# Patient Record
Sex: Male | Born: 1950
Health system: Southern US, Community
[De-identification: ages and names within clinical notes are randomized; demographics above are authoritative.]

## PROBLEM LIST (undated history)

## (undated) DIAGNOSIS — T4145XA Adverse effect of unspecified anesthetic, initial encounter: Secondary | ICD-10-CM

## (undated) DIAGNOSIS — T8859XA Other complications of anesthesia, initial encounter: Secondary | ICD-10-CM

## (undated) DIAGNOSIS — H9190 Unspecified hearing loss, unspecified ear: Secondary | ICD-10-CM

## (undated) DIAGNOSIS — E785 Hyperlipidemia, unspecified: Secondary | ICD-10-CM

## (undated) DIAGNOSIS — Z974 Presence of external hearing-aid: Secondary | ICD-10-CM

## (undated) DIAGNOSIS — B009 Herpesviral infection, unspecified: Secondary | ICD-10-CM

## (undated) DIAGNOSIS — J302 Other seasonal allergic rhinitis: Secondary | ICD-10-CM

## (undated) DIAGNOSIS — S0300XA Dislocation of jaw, unspecified side, initial encounter: Secondary | ICD-10-CM

## (undated) DIAGNOSIS — G43709 Chronic migraine without aura, not intractable, without status migrainosus: Secondary | ICD-10-CM

## (undated) DIAGNOSIS — G4700 Insomnia, unspecified: Secondary | ICD-10-CM

## (undated) DIAGNOSIS — G4730 Sleep apnea, unspecified: Secondary | ICD-10-CM

## (undated) DIAGNOSIS — T7840XA Allergy, unspecified, initial encounter: Secondary | ICD-10-CM

## (undated) DIAGNOSIS — E119 Type 2 diabetes mellitus without complications: Secondary | ICD-10-CM

## (undated) HISTORY — DX: Allergy, unspecified, initial encounter: T78.40XA

## (undated) HISTORY — DX: Chronic migraine without aura, not intractable, without status migrainosus: G43.709

## (undated) HISTORY — PX: TONSILLECTOMY AND ADENOIDECTOMY: SUR1326

## (undated) HISTORY — DX: Insomnia, unspecified: G47.00

## (undated) HISTORY — PX: MIDDLE EAR SURGERY: SHX713

## (undated) HISTORY — DX: Sleep apnea, unspecified: G47.30

## (undated) HISTORY — PX: KIDNEY STONE SURGERY: SHX686

---

## 2006-02-19 ENCOUNTER — Emergency Department: Payer: Self-pay | Admitting: Unknown Physician Specialty

## 2007-01-16 ENCOUNTER — Ambulatory Visit: Payer: Self-pay | Admitting: Family Medicine

## 2010-02-21 ENCOUNTER — Emergency Department: Payer: Self-pay | Admitting: Emergency Medicine

## 2010-02-25 ENCOUNTER — Ambulatory Visit: Payer: Self-pay

## 2012-07-09 DIAGNOSIS — E221 Hyperprolactinemia: Secondary | ICD-10-CM | POA: Insufficient documentation

## 2012-11-04 ENCOUNTER — Observation Stay: Payer: Self-pay | Admitting: Specialist

## 2012-11-04 LAB — TROPONIN I
Troponin-I: <0.02 ng/mL
Troponin-I: <0.02 ng/mL

## 2012-11-04 LAB — COMPREHENSIVE METABOLIC PANEL
BUN: 9 mg/dL (ref 7–18)
Bilirubin,Total: 0.3 mg/dL (ref 0.2–1.0)
Chloride: 105 mmol/L (ref 98–107)
EGFR (Non-African Amer.): >60
Glucose: 192 mg/dL — ABNORMAL HIGH (ref 65–99)
Osmolality: 285 (ref 275–301)
SGOT(AST): 13 U/L — ABNORMAL LOW (ref 15–37)
Sodium: 141 mmol/L (ref 136–145)
Total Protein: 7.2 g/dL (ref 6.4–8.2)

## 2012-11-04 LAB — CBC
HCT: 42.3 % (ref 40.0–52.0)
HGB: 14.9 g/dL (ref 13.0–18.0)
MCH: 32.6 pg (ref 26.0–34.0)
MCV: 93 fL (ref 80–100)
RBC: 4.57 10*6/uL (ref 4.40–5.90)
WBC: 7.1 10*3/uL (ref 3.8–10.6)

## 2012-11-04 LAB — TSH: Thyroid Stimulating Horm: 1.53 u[IU]/mL

## 2012-11-04 LAB — CK TOTAL AND CKMB (NOT AT ARMC): CK, Total: 107 U/L (ref 35–232)

## 2012-11-05 DIAGNOSIS — I209 Angina pectoris, unspecified: Secondary | ICD-10-CM

## 2012-11-05 LAB — BASIC METABOLIC PANEL
Calcium, Total: 8.6 mg/dL (ref 8.5–10.1)
Chloride: 109 mmol/L — ABNORMAL HIGH (ref 98–107)
Co2: 27 mmol/L (ref 21–32)
Creatinine: 1.08 mg/dL (ref 0.60–1.30)
EGFR (African American): >60
EGFR (Non-African Amer.): >60
Glucose: 117 mg/dL — ABNORMAL HIGH (ref 65–99)
Potassium: 3.7 mmol/L (ref 3.5–5.1)
Sodium: 142 mmol/L (ref 136–145)

## 2012-11-05 LAB — CBC WITH DIFFERENTIAL/PLATELET
Basophil #: 0.1 10*3/uL (ref 0.0–0.1)
Basophil %: 0.5 %
Eosinophil %: 2.8 %
HGB: 13.5 g/dL (ref 13.0–18.0)
MCH: 32.8 pg (ref 26.0–34.0)
MCV: 92 fL (ref 80–100)
Monocyte #: 0.7 x10 3/mm (ref 0.2–1.0)
Neutrophil %: 72.7 %
Platelet: 149 10*3/uL — ABNORMAL LOW (ref 150–440)
RBC: 4.12 10*6/uL — ABNORMAL LOW (ref 4.40–5.90)
RDW: 13.2 % (ref 11.5–14.5)

## 2012-11-05 LAB — CK TOTAL AND CKMB (NOT AT ARMC)
CK, Total: 94 U/L (ref 35–232)
CK-MB: 1.2 ng/mL (ref 0.5–3.6)
CK-MB: 0.5 ng/mL (ref 0.5–3.6)

## 2012-11-05 LAB — TROPONIN I: Troponin-I: <0.02 ng/mL

## 2013-08-07 DIAGNOSIS — Z86010 Personal history of colonic polyps: Secondary | ICD-10-CM | POA: Insufficient documentation

## 2013-08-07 LAB — HM COLONOSCOPY

## 2014-05-27 ENCOUNTER — Emergency Department: Payer: Self-pay | Admitting: Emergency Medicine

## 2014-05-27 LAB — COMPREHENSIVE METABOLIC PANEL
ALBUMIN: 4.2 g/dL (ref 3.4–5.0)
ANION GAP: 8 (ref 7–16)
AST: 15 U/L (ref 15–37)
Alkaline Phosphatase: 76 U/L
BUN: 18 mg/dL (ref 7–18)
Bilirubin,Total: 0.5 mg/dL (ref 0.2–1.0)
CALCIUM: 8.9 mg/dL (ref 8.5–10.1)
CREATININE: 1.4 mg/dL — AB (ref 0.60–1.30)
Chloride: 105 mmol/L (ref 98–107)
Co2: 26 mmol/L (ref 21–32)
EGFR (Non-African Amer.): 54 — ABNORMAL LOW
GLUCOSE: 188 mg/dL — AB (ref 65–99)
OSMOLALITY: 284 (ref 275–301)
Potassium: 3.6 mmol/L (ref 3.5–5.1)
SGPT (ALT): 35 U/L
Sodium: 139 mmol/L (ref 136–145)
Total Protein: 6.8 g/dL (ref 6.4–8.2)

## 2014-05-27 LAB — URINALYSIS, COMPLETE
Bacteria: NONE SEEN
Bilirubin,UR: NEGATIVE
Glucose,UR: 150 mg/dL (ref 0–75)
Leukocyte Esterase: NEGATIVE
Nitrite: NEGATIVE
PH: 5 (ref 4.5–8.0)
Specific Gravity: 1.028 (ref 1.003–1.030)
Squamous Epithelial: <1
WBC UR: 2 /HPF (ref 0–5)

## 2014-05-27 LAB — CBC
HCT: 44.5 % (ref 40.0–52.0)
HGB: 14.8 g/dL (ref 13.0–18.0)
MCH: 31.5 pg (ref 26.0–34.0)
MCHC: 33.3 g/dL (ref 32.0–36.0)
MCV: 95 fL (ref 80–100)
Platelet: 193 10*3/uL (ref 150–440)
RBC: 4.7 10*6/uL (ref 4.40–5.90)
RDW: 12.7 % (ref 11.5–14.5)
WBC: 13.2 10*3/uL — ABNORMAL HIGH (ref 3.8–10.6)

## 2014-05-27 LAB — LIPASE, BLOOD: Lipase: 136 U/L (ref 73–393)

## 2014-09-04 NOTE — Discharge Summary (Signed)
PATIENT NAME:  Billy Hardin, Billy Hardin MR#:  [id number] DATE OF BIRTH:  1950-11-06  DATE OF ADMISSION:  11/04/2012 DATE OF DISCHARGE:  11/05/2012  For a detailed note, please see the history and physical done on admission by Dr. Tressia Miners.   DIAGNOSES AT DISCHARGE: 1.  Chest pain, likely related to anxiety and stress.  2.  Testosterone deficiency.  3.  Early glucose intolerance.   DIET: The patient is being discharged on a regular diet.   ACTIVITY: As tolerated.   FOLLOW-UP: With Dr. Lelon Huh in the next 1 to 2 weeks.    DISCHARGE MEDICATIONS: Aspirin 81 mg daily,  cinnamon tablets b.i.d., vitamin E 400 international units b.i.d., testosterone transdermally daily.   PERTINENT STUDIES DONE DURING THE HOSPITAL COURSE: As follows: A chest x-ray done on admission showing no acute cardiopulmonary disease. A nuclear medicine stress test done on June 24, showing exercise myocardial perfusion study with no significant ischemia, no wall motion abnormalities, ejection fraction of 68%. No EKG changes concerning for ischemia.   HOSPITAL COURSE: This is a 64 year old male with medical problems as mentioned above, presented to the hospital with chest pain.  1.  Chest pain. The patient has had some typical and atypical symptoms for angina; therefore, was observed overnight on telemetry, had three sets of cardiac markers checked, which were negative. He also underwent exercise treadmill nuclear stress test, which was essentially normal. The most likely cause of his chest pain was probably anxiety-related as he has been under a lot of stress. He was told to follow up with his primary care physician for further assessment or need for medications for his anxiety. Since the patient is currently chest pain-free and hemodynamically stable. He is being discharged home.  2.  Testosterone deficiency. The patient will resume his testosterone supplements.  3.  Hyperglycemia. The patient random blood sugar on admission was  192. His hemoglobin A1c was 6.8. His sugars have remained above 100,  like 117 and 130s. He likely has early glucose intolerance. Does not need to be treated, but this further needs to be followed by his primary care physician, Dr. Lelon Huh.   TIME SPENT DISCHARGE: 35 minutes   ____________________________ Belia Heman. Verdell Carmine, MD vjs:cc D: 11/05/2012 15:34:23 ET T: 11/05/2012 16:21:30 ET JOB#: [id number]  cc: Belia Heman. Verdell Carmine, MD, <Dictator> Kirstie Peri. Caryn Section, MD Henreitta Leber MD ELECTRONICALLY SIGNED 11/15/2012 15:04

## 2014-09-04 NOTE — H&P (Signed)
PATIENT NAME:  Billy Hardin, Billy Hardin MR#:  [id number] DATE OF BIRTH:  [birth year]  DATE OF ADMISSION:  11/04/2012  ADMITTING PHYSICIAN: Gladstone Lighter, MD   PRIMARY CARE PHYSICIAN: Lelon Huh, MD  CHIEF COMPLAINT: Chest pain.   HISTORY OF PRESENT ILLNESS: Mr. Millon is a 64 year old Caucasian male with past medical history significant for diet-controlled diabetes mellitus and hard of hearing in the right ear secondary to childhood disease, resulting in surgery on his ear. He comes from home secondary to chest pain that started yesterday. The patient was on vacation for the last two days and did not sleep well during the vacation. They returned from the vacation yesterday. He is complaining of some chest pain. It initially felt more like an indigestion pain, nonradiating, associated with some nocturia. No dyspnea or diaphoresis.     FAMILY HISTORY: Family history of heart disease. Father had heart disease, but in his late 77s and 30s. The patient works as a Physiological scientist, pretty active at baseline and has not noticed any chest pain on exertion in the past. His first set of troponins is negative. He is being admitted under observation to rule out myocardial infarction.   PAST MEDICAL HISTORY: 1.  Diabetes mellitus.  2.  Hard of hearing, right ear worse than the left ear.   PAST SURGICAL HISTORY: Surgery on his right ear right bones when he was a child.   ALLERGIES: PENICILLIN.   CURRENT HOME MEDICATIONS:  1.   Aspirin 81 mg p.o. daily.  2.  Cialis as needed.  3.  Vitamins p.o. daily.   SOCIAL HISTORY: Lives at home with his wife. No smoking or alcohol use. Works as a Physiological scientist.   FAMILY HISTORY: Dad with heart disease and mom with diabetes.   REVIEW OF SYSTEMS:  CONSTITUTIONAL: No fever, fatigue or weakness.  EYES: No blurred vision, double vision, glaucoma or cataracts. Uses reading glasses.  ENT: Positive for hearing loss worse on the right side and on a better on the left  side, as he has hearing aids there.  No tinnitus, ear pain, epistaxis or discharge.  RESPIRATORY: No cough, wheeze, hemoptysis or chronic obstructive pulmonary disease.  CARDIOVASCULAR: Positive for chest pain. No orthopnea, edema, arrhythmia, palpitations or syncope.  GASTROINTESTINAL: No nausea, vomiting, diarrhea, abdominal pain, hematemesis or melena.  GENITOURINARY: No dysuria, hematuria, renal calculus, frequency or incontinence.  ENDOCRINE: No polyuria, nocturia, thyroid problems, heat or cold intolerance.  HEMATOLOGY: No anemia, easy bruising or bleeding.  SKIN: No acne, rash or lesions.  MUSCULOSKELETAL: No neck, back, shoulder pain, arthritis or gout.  NEUROLOGIC: No numbness, weakness, CVA, transient ischemic attack or seizures.  PSYCHOLOGICAL: No anxiety, insomnia, depression.   PHYSICAL EXAMINATION: VITAL SIGNS: Temperature 98.4 degree Fahrenheit,  pulse 88, respirations 24, blood pressure 155/74, pulse oximetry 100% on room air.  GENERAL: Well-built, well-nourished male lying in bed, not in any acute distress.  HEENT: Normocephalic, atraumatic. Pupils equal, round, reacting to light. Anicteric sclerae. Extraocular movements intact. Oropharynx clear without erythema, mass or exudates.  NECK: Supple. No thyromegaly, JVD or carotid bruits. No lymphadenopathy. Normal full range of motion without pain present.  LUNGS: Clear to auscultation bilaterally. No wheeze or crackles. No use of accessory muscles for breathing.  CARDIOVASCULAR: S1, S2 regular rate and rhythm. No murmurs, rubs or gallops.  ABDOMEN: Soft, nontender, nondistended. No hepatosplenomegaly. Normal bowel sounds.  EXTREMITIES: No pedal edema. No clubbing or cyanosis, 2+ dorsalis pedis pulses palpable bilaterally.  MUSCULOSKELETAL: Joints without any tenderness or effusion.  SKIN: No acne, rash or lesions.  LYMPHATICS: No cervical or inguinal lymphadenopathy.  NEUROLOGIC: Cranial nerves, except for eighth cranial  nerve, secondary to hearing loss seems to be intact. Deep tendon reflexes are 2+ symmetric bilateral lower and upper extremities. Motor strength is 5/5 up at all four extremities. Sensation is intact.  PSYCHOLOGICAL: The patient is awake, alert, oriented x 3.   LABORATORY, DIAGNOSTIC AND RADIOLOGI DATA: WBC 7.1, hemoglobin 14.9, hematocrit 42.3, platelet count 174.   Sodium 141, potassium 3.3, chloride 105, bicarbonate 26, BUN 9, creatinine 1.06 glucose 192, and calcium 9.1.   ALT 25, AST 13, alkaline phosphatase 67, total bilirubin 0.3, albumin of 4.1, lipase 111. Troponin less than 0.02.   EKG normal sinus rhythm, heart rate of 76, no acute ST-T wave abnormalities.   ASSESSMENT AND PLAN: A 64 year old male with no significant past medical history other than diet-controlled diabetes mellitus, brought in for chest pain.  1.  Chest pain. Could be more atypical angina or reflux pain, as it started as indigestion, but because of his risk factors, which include diabetes and family history, he is being admitted under observation to telemetry and we will get a Myoview in the morning. Recycle cardiac enzymes, continue aspirin and nitroglycerin and check lipid profile.  2.  Diabetes mellitus. Taken off of medications and diet control lately. Check hemoglobin A1c and placed on sliding scale.  3.  Gastrointestinal and deep vein thrombosis prophylaxis. On Protonix and Lovenox.  4.  Hypokalemia.  The patient's potassium is being repleted.    CODE STATUS: Full code.   TIME SPENT ON ADMISSION: 50 minutes summary.  ____________________________ Gladstone Lighter, MD rk:cc D: 11/04/2012 21:55:15 ET T: 11/04/2012 22:10:35 ET JOB#: [id number]  cc: Gladstone Lighter, MD, <Dictator> Gladstone Lighter MD ELECTRONICALLY SIGNED 11/05/2012 15:09

## 2014-09-15 DIAGNOSIS — H905 Unspecified sensorineural hearing loss: Secondary | ICD-10-CM | POA: Insufficient documentation

## 2014-09-15 DIAGNOSIS — H903 Sensorineural hearing loss, bilateral: Secondary | ICD-10-CM | POA: Insufficient documentation

## 2014-10-16 ENCOUNTER — Other Ambulatory Visit: Payer: Self-pay | Admitting: Family Medicine

## 2014-10-16 DIAGNOSIS — E119 Type 2 diabetes mellitus without complications: Secondary | ICD-10-CM

## 2014-10-16 MED ORDER — METFORMIN HCL 500 MG PO TABS: 500.0000 mg | ORAL_TABLET | Freq: Two times a day (BID) | ORAL | Status: DC

## 2014-10-21 ENCOUNTER — Encounter
Admission: RE | Admit: 2014-10-21 | Discharge: 2014-10-21 | Disposition: A | Discharge status: Home / Self Care | Payer: BLUE CROSS/BLUE SHIELD | Source: Ambulatory Visit | Attending: Urology | Admitting: Urology

## 2014-10-21 DIAGNOSIS — Z01812 Encounter for preprocedural laboratory examination: Secondary | ICD-10-CM | POA: Insufficient documentation

## 2014-10-21 DIAGNOSIS — Z01810 Encounter for preprocedural cardiovascular examination: Secondary | ICD-10-CM | POA: Diagnosis not present

## 2014-10-21 HISTORY — DX: Adverse effect of unspecified anesthetic, initial encounter: T41.45XA

## 2014-10-21 HISTORY — DX: Type 2 diabetes mellitus without complications: E11.9

## 2014-10-21 HISTORY — DX: Other seasonal allergic rhinitis: J30.2

## 2014-10-21 HISTORY — DX: Presence of external hearing-aid: Z97.4

## 2014-10-21 HISTORY — DX: Other complications of anesthesia, initial encounter: T88.59XA

## 2014-10-21 HISTORY — DX: Dislocation of jaw, unspecified side, initial encounter: S03.00XA

## 2014-10-21 HISTORY — DX: Unspecified hearing loss, unspecified ear: H91.90

## 2014-10-21 HISTORY — DX: Herpesviral infection, unspecified: B00.9

## 2014-10-21 HISTORY — DX: Hyperlipidemia, unspecified: E78.5

## 2014-10-21 LAB — BASIC METABOLIC PANEL
ANION GAP: 8 (ref 5–15)
BUN: 18 mg/dL (ref 6–20)
CALCIUM: 9.5 mg/dL (ref 8.9–10.3)
CO2: 26 mmol/L (ref 22–32)
Chloride: 106 mmol/L (ref 101–111)
Creatinine, Ser: 0.89 mg/dL (ref 0.61–1.24)
GFR calc Af Amer: >60 mL/min (ref >60)
GFR calc non Af Amer: >60 mL/min (ref >60)
Glucose, Bld: 169 mg/dL — ABNORMAL HIGH (ref 65–99)
Potassium: 3.6 mmol/L (ref 3.5–5.1)
Sodium: 140 mmol/L (ref 135–145)

## 2014-10-21 NOTE — Patient Instructions (Signed)
  Your procedure is scheduled on: 11/03/14  Tues. Report to Day Surgery. To find out your arrival time please call [phone] between 1PM - 3PM on 11/02/14 Monday Remember: Instructions that are not followed completely may result in serious medical risk, up to and including death, or upon the discretion of your surgeon and anesthesiologist your surgery may need to be rescheduled.    __x__ 1. Do not eat food or drink liquids after midnight. No gum chewing or hard candies.     ____ 2. No Alcohol for 24 hours before or after surgery.   ____ 3. Bring all medications with you on the day of surgery if instructed.    __x__ 4. Notify your doctor if there is any change in your medical condition     (cold, fever, infections).     Do not wear jewelry, make-up, hairpins, clips or nail polish.  Do not wear lotions, powders, or perfumes. You may wear deodorant.  Do not shave 48 hours prior to surgery. Men may shave face and neck.  Do not bring valuables to the hospital.    [facility] is not responsible for any belongings or valuables.               Contacts, dentures or bridgework may not be worn into surgery.  Leave your suitcase in the car. After surgery it may be brought to your room.  For patients admitted to the hospital, discharge time is determined by your                treatment team.   Patients discharged the day of surgery will not be allowed to drive home.   Please read over the following fact sheets that you were given:      __x__ Take these medicines the morning of surgery with A SIP OF WATER:    1. LIpitor  2.   3.   4.  5.  6.  ____ Fleet Enema (as directed)   _x___ Use CHG Soap as directed  ____ Use inhalers on the day of surgery  __x__ Stop metformin 2 days prior to surgery    ____ Take 1/2 of usual insulin dose the night before surgery and none on the morning of surgery.   ____ Stop Coumadin/Plavix/aspirin on .  ____ Stop Anti-inflammatories on    __x__  Stop supplements until after surgery.  Stop vitamin E and fish oils 7 days before surgery  ____ Bring C-Pap to the hospital.

## 2014-10-22 NOTE — H&P (Signed)
NAME:  Billy Hardin, KNOPE NO.:  [id number]  MEDICAL RECORD NO.:  [id number]  LOCATION:  PERIO                        FACILITY:  [facility]  PHYSICIAN:  Maryan Puls          DATE OF BIRTH:  [birth year]  DATE OF ADMISSION:  11/03/2014 DATE OF DISCHARGE:                            HISTORY AND PHYSICAL   Same-day surgery scheduled November 03, 2014.  CHIEF COMPLAINT: Erectile dysfunction.  HISTORY OF PRESENT ILLNESS: Mr. Billy Hardin is a 64 year old Caucasian male with a greater than 4-year history of erectile dysfunction.  He is not having adequate results with Cialis, Viagra or Levitra, and comes in now for penile prosthesis placement.  The patient also has hypogonadism and is currently on testosterone cream.  ALLERGIES: ALLERGIC TO PENICILLIN.  CURRENT MEDICATIONS: 1. Allergy Relief. 2. L-arginine. 3. Atorvastatin. 4. Valacyclovir. 5. Fish oil. 6. Fluticasone. 7. Tramadol. 8. Cialis. 9. Metformin. 10.Aspirin.  PREVIOUS SURGICAL PROCEDURES:  Include repair of a right ear injury in 1965, which has resulted in hearing loss on that side.  SOCIAL HISTORY:  The patient denied tobacco use.  Consumes 1 to 4 alcoholic beverages per week.  FAMILY HISTORY:  Remarkable for parents with diabetes.  PAST AND CURRENT MEDICAL CONDITIONS: 1. Type 1 diabetes. 2. Herpes simplex. 3. Plantar fasciitis. 4. Hypogonadism.  REVIEW OF SYSTEMS: The patient occasionally has hot flashes and night sweats.  He has complete hearing loss on the right and partial hearing loss on the left requiring use of a hearing aid.  He has chronic insomnia.  He denied chest pain, shortness of breath, stroke or hypertension.  PHYSICAL EXAMINATION:  GENERAL:  Well nourished white male in no acute distress. HEENT:  Sclerae were clear.  Pupils were equally round, reactive to light and accommodation.  Extraocular movements were intact. NECK:  Supple.  No palpable cervical lymphadenopathy.  No  audible carotid bruits. LUNGS:  Clear to auscultation. CARDIOVASCULAR:  Regular rate and rhythm without audible murmurs. ABDOMEN:  Soft, nontender abdomen. GU:  Circumcised. Testes atrophic, 10 cc in size each. RECTAL:  20 gram, smooth, non-tender prostate. NEUROMUSCULAR:  Alert and oriented x3.  IMPRESSION: 1. Erectile dysfunction. 2. Hypogonadism.  PLAN:  Inflatable penile prosthesis placement.          ______________________________ Maryan Puls     MW/MEDQ  D:  10/21/2014  T:  10/21/2014  Job:  [id number]

## 2014-11-03 ENCOUNTER — Observation Stay
Admission: RE | Admit: 2014-11-03 | Discharge: 2014-11-04 | Disposition: A | Discharge status: Home / Self Care | Payer: BLUE CROSS/BLUE SHIELD | Source: Ambulatory Visit | Attending: Urology | Admitting: Urology

## 2014-11-03 ENCOUNTER — Encounter
Admission: RE | Disposition: A | Discharge status: Home / Self Care | Payer: Self-pay | Source: Ambulatory Visit | Attending: Urology

## 2014-11-03 ENCOUNTER — Ambulatory Visit: Payer: BLUE CROSS/BLUE SHIELD | Admitting: Anesthesiology

## 2014-11-03 DIAGNOSIS — Z880 Allergy status to penicillin: Secondary | ICD-10-CM | POA: Insufficient documentation

## 2014-11-03 DIAGNOSIS — E291 Testicular hypofunction: Secondary | ICD-10-CM | POA: Insufficient documentation

## 2014-11-03 DIAGNOSIS — E119 Type 2 diabetes mellitus without complications: Secondary | ICD-10-CM | POA: Diagnosis present

## 2014-11-03 DIAGNOSIS — E109 Type 1 diabetes mellitus without complications: Secondary | ICD-10-CM | POA: Insufficient documentation

## 2014-11-03 DIAGNOSIS — Z79899 Other long term (current) drug therapy: Secondary | ICD-10-CM | POA: Insufficient documentation

## 2014-11-03 DIAGNOSIS — B009 Herpesviral infection, unspecified: Secondary | ICD-10-CM | POA: Diagnosis not present

## 2014-11-03 DIAGNOSIS — Z9889 Other specified postprocedural states: Secondary | ICD-10-CM | POA: Insufficient documentation

## 2014-11-03 DIAGNOSIS — N529 Male erectile dysfunction, unspecified: Secondary | ICD-10-CM | POA: Diagnosis present

## 2014-11-03 DIAGNOSIS — H9191 Unspecified hearing loss, right ear: Secondary | ICD-10-CM | POA: Insufficient documentation

## 2014-11-03 DIAGNOSIS — Z7982 Long term (current) use of aspirin: Secondary | ICD-10-CM | POA: Insufficient documentation

## 2014-11-03 HISTORY — PX: PENILE PROSTHESIS IMPLANT: SHX240

## 2014-11-03 LAB — GLUCOSE, CAPILLARY
GLUCOSE-CAPILLARY: 158 mg/dL — AB (ref 65–99)
GLUCOSE-CAPILLARY: 148 mg/dL — AB (ref 65–99)
Glucose-Capillary: 112 mg/dL — ABNORMAL HIGH (ref 65–99)

## 2014-11-03 SURGERY — INSERTION, PENILE PROSTHESIS, INFLATABLE
Anesthesia: General | Wound class: Clean Contaminated

## 2014-11-03 MED ORDER — MENTHOL 3 MG MT LOZG
1.0000 | LOZENGE | OROMUCOSAL | Status: DC | PRN
  Filled 2014-11-03: qty 9

## 2014-11-03 MED ORDER — VANCOMYCIN HCL 500 MG IV SOLR
500.0000 mg | Freq: Once | INTRAVENOUS | Status: AC
  Administered 2014-11-03: 500 mg via INTRAVENOUS
  Filled 2014-11-03 (×2): qty 500

## 2014-11-03 MED ORDER — FAMOTIDINE 20 MG PO TABS: 20.0000 mg | ORAL_TABLET | Freq: Once | ORAL | Status: DC

## 2014-11-03 MED ORDER — NEOMYCIN-POLYMYXIN B GU 40-200000 IR SOLN
Status: AC
  Filled 2014-11-03: qty 4

## 2014-11-03 MED ORDER — HYDROMORPHONE HCL 1 MG/ML IJ SOLN
INTRAMUSCULAR | Status: AC
  Filled 2014-11-03: qty 1

## 2014-11-03 MED ORDER — BISACODYL 10 MG RE SUPP: 10.0000 mg | Freq: Every day | RECTAL | Status: DC | PRN

## 2014-11-03 MED ORDER — FENTANYL CITRATE (PF) 100 MCG/2ML IJ SOLN
INTRAMUSCULAR | Status: AC
  Filled 2014-11-03: qty 2

## 2014-11-03 MED ORDER — ONDANSETRON HCL 4 MG/2ML IJ SOLN
INTRAMUSCULAR | Status: DC | PRN
Start: 2014-11-03 — End: 2014-11-03
  Administered 2014-11-03: 4 mg via INTRAVENOUS

## 2014-11-03 MED ORDER — ONDANSETRON HCL 4 MG/2ML IJ SOLN
4.0000 mg | INTRAMUSCULAR | Status: DC | PRN
Start: 2014-11-03 — End: 2014-11-04

## 2014-11-03 MED ORDER — ZOLPIDEM TARTRATE 5 MG PO TABS: 5.0000 mg | ORAL_TABLET | Freq: Every evening | ORAL | Status: DC | PRN

## 2014-11-03 MED ORDER — SODIUM CHLORIDE 0.9 % IJ SOLN: 3.0000 mL | INTRAMUSCULAR | Status: DC | PRN

## 2014-11-03 MED ORDER — FENTANYL CITRATE (PF) 100 MCG/2ML IJ SOLN
INTRAMUSCULAR | Status: DC | PRN
  Administered 2014-11-03 (×4): 25 ug via INTRAVENOUS

## 2014-11-03 MED ORDER — LEVOFLOXACIN IN D5W 500 MG/100ML IV SOLN
INTRAVENOUS | Status: AC
  Administered 2014-11-03: 500 mg via INTRAVENOUS
  Filled 2014-11-03: qty 100

## 2014-11-03 MED ORDER — LEVOFLOXACIN IN D5W 500 MG/100ML IV SOLN
500.0000 mg | INTRAVENOUS | Status: DC
Start: 2014-11-03 — End: 2014-11-03

## 2014-11-03 MED ORDER — VANCOMYCIN HCL 500 MG IV SOLR: 500.0000 mg | Freq: Two times a day (BID) | INTRAVENOUS | Status: DC

## 2014-11-03 MED ORDER — FENTANYL CITRATE (PF) 100 MCG/2ML IJ SOLN
25.0000 ug | INTRAMUSCULAR | Status: DC | PRN
  Administered 2014-11-03 (×4): 25 ug via INTRAVENOUS

## 2014-11-03 MED ORDER — MIDAZOLAM HCL 2 MG/2ML IJ SOLN
INTRAMUSCULAR | Status: DC | PRN
  Administered 2014-11-03: 2 mg via INTRAVENOUS

## 2014-11-03 MED ORDER — LIDOCAINE HCL (CARDIAC) 20 MG/ML IV SOLN
INTRAVENOUS | Status: DC | PRN
  Administered 2014-11-03: 100 mg via INTRAVENOUS

## 2014-11-03 MED ORDER — SODIUM CHLORIDE 0.9 % IJ SOLN
3.0000 mL | Freq: Two times a day (BID) | INTRAMUSCULAR | Status: DC
  Administered 2014-11-04: 3 mL via INTRAVENOUS

## 2014-11-03 MED ORDER — HYDROCODONE-ACETAMINOPHEN 5-325 MG PO TABS
1.0000 | ORAL_TABLET | ORAL | Status: DC | PRN
  Administered 2014-11-03: 1 via ORAL
  Administered 2014-11-03 – 2014-11-04 (×3): 2 via ORAL
  Filled 2014-11-03 (×3): qty 2
  Filled 2014-11-03: qty 1

## 2014-11-03 MED ORDER — DEXAMETHASONE SODIUM PHOSPHATE 4 MG/ML IJ SOLN
INTRAMUSCULAR | Status: DC | PRN
  Administered 2014-11-03: 5 mg via INTRAVENOUS

## 2014-11-03 MED ORDER — GENTAMICIN IN SALINE 1.6-0.9 MG/ML-% IV SOLN
80.0000 mg | Freq: Once | INTRAVENOUS | Status: AC
  Administered 2014-11-03: 80 mg via INTRAVENOUS
  Filled 2014-11-03: qty 50

## 2014-11-03 MED ORDER — NEOMYCIN-POLYMYXIN B GU 40-200000 IR SOLN
Status: DC | PRN
  Administered 2014-11-03: 4 mL

## 2014-11-03 MED ORDER — SODIUM CHLORIDE 0.9 % IV SOLN: 250.0000 mL | INTRAVENOUS | Status: DC | PRN

## 2014-11-03 MED ORDER — HYDROMORPHONE HCL 1 MG/ML IJ SOLN
0.5000 mg | INTRAMUSCULAR | Status: DC | PRN
  Administered 2014-11-03 – 2014-11-04 (×3): 1 mg via INTRAVENOUS
  Filled 2014-11-03 (×3): qty 1

## 2014-11-03 MED ORDER — ACETAMINOPHEN 325 MG PO TABS: 650.0000 mg | ORAL_TABLET | ORAL | Status: DC | PRN

## 2014-11-03 MED ORDER — GENTAMICIN IN SALINE 1.6-0.9 MG/ML-% IV SOLN: 80.0000 mg | Freq: Three times a day (TID) | INTRAVENOUS | Status: DC

## 2014-11-03 MED ORDER — INSULIN ASPART 100 UNIT/ML ~~LOC~~ SOLN
0.0000 [IU] | SUBCUTANEOUS | Status: DC
  Administered 2014-11-03 – 2014-11-04 (×4): 2 [IU] via SUBCUTANEOUS
  Filled 2014-11-03 (×4): qty 2

## 2014-11-03 MED ORDER — ONDANSETRON HCL 4 MG/2ML IJ SOLN: 4.0000 mg | Freq: Once | INTRAMUSCULAR | Status: DC | PRN

## 2014-11-03 MED ORDER — BELLADONNA ALKALOIDS-OPIUM 16.2-60 MG RE SUPP
RECTAL | Status: AC
  Filled 2014-11-03: qty 1

## 2014-11-03 MED ORDER — PROPOFOL 10 MG/ML IV BOLUS
INTRAVENOUS | Status: DC | PRN
  Administered 2014-11-03: 160 mg via INTRAVENOUS

## 2014-11-03 MED ORDER — HYDROMORPHONE HCL 1 MG/ML IJ SOLN
0.5000 mg | INTRAMUSCULAR | Status: DC | PRN
Start: 2014-11-03 — End: 2014-11-03
  Administered 2014-11-03 (×2): 0.5 mg via INTRAVENOUS

## 2014-11-03 MED ORDER — MINERAL OIL LIGHT 100 % EX OIL
TOPICAL_OIL | CUTANEOUS | Status: AC
  Filled 2014-11-03: qty 25

## 2014-11-03 MED ORDER — PHENOL 1.4 % MT LIQD: 1.0000 | OROMUCOSAL | Status: DC | PRN

## 2014-11-03 MED ORDER — SODIUM CHLORIDE 0.9 % IV SOLN
INTRAVENOUS | Status: DC
  Administered 2014-11-03: 13:00:00 via INTRAVENOUS

## 2014-11-03 MED ORDER — BELLADONNA ALKALOIDS-OPIUM 16.2-60 MG RE SUPP
1.0000 | Freq: Four times a day (QID) | RECTAL | Status: DC | PRN
  Administered 2014-11-03: 1 via RECTAL

## 2014-11-03 SURGICAL SUPPLY — 54 items
AMS Quick connect sutureless window connectors ×3 IMPLANT
BAG URO DRAIN 2000ML W/SPOUT (MISCELLANEOUS) ×3 IMPLANT
BLADE CLIPPER SURG (BLADE) ×3 IMPLANT
BLADE SURG 15 STRL LF DISP TIS (BLADE) ×1 IMPLANT
BLADE SURG 15 STRL SS (BLADE) ×2
CANISTER SUCT 1200ML W/VALVE (MISCELLANEOUS) ×3 IMPLANT
CATH FOL 2WAY LX 18X5 (CATHETERS) ×3 IMPLANT
CLOSURE WOUND 1/2 X4 (GAUZE/BANDAGES/DRESSINGS) ×1
CONTRO-BULB SYRINGE ×3 IMPLANT
COVER MAYO STAND STRL (DRAPES) ×3 IMPLANT
DRAPE PED LAPAROTOMY (DRAPES) ×3 IMPLANT
DRESSING TELFA 4X3 1S ST N-ADH (GAUZE/BANDAGES/DRESSINGS) ×3 IMPLANT
DRSG TEGADERM 4X4.75 (GAUZE/BANDAGES/DRESSINGS) ×3 IMPLANT
DRSG TELFA 3X8 NADH (GAUZE/BANDAGES/DRESSINGS) ×3 IMPLANT
ELECT BLADE 6 FLAT ULTRCLN (ELECTRODE) ×3 IMPLANT
GAUZE FLUFF 18X24 1PLY STRL (GAUZE/BANDAGES/DRESSINGS) ×3 IMPLANT
GAUZE SPONGE 4X4 12PLY STRL (GAUZE/BANDAGES/DRESSINGS) ×3 IMPLANT
GLOVE BIO SURGEON STRL SZ7 (GLOVE) ×3 IMPLANT
GLOVE BIO SURGEON STRL SZ7.5 (GLOVE) ×6 IMPLANT
GOWN STRL REUS W/ TWL LRG LVL3 (GOWN DISPOSABLE) ×2 IMPLANT
GOWN STRL REUS W/TWL LRG LVL3 (GOWN DISPOSABLE) ×4
JELLY LUB 2OZ STRL (MISCELLANEOUS) ×2
JELLY LUBE 2OZ STRL (MISCELLANEOUS) ×1 IMPLANT
KIT ACCESSORY AMS 700 PUMP (UROLOGICAL SUPPLIES) ×3 IMPLANT
KIT RM TURNOVER STRD PROC AR (KITS) ×3 IMPLANT
LABEL OR SOLS (LABEL) ×3 IMPLANT
NS IRRIG 1000ML POUR BTL (IV SOLUTION) ×3 IMPLANT
PACK BASIN MAJOR ARMC (MISCELLANEOUS) ×3 IMPLANT
PAD ABD DERMACEA PRESS 5X9 (GAUZE/BANDAGES/DRESSINGS) ×3 IMPLANT
PAD GROUND ADULT SPLIT (MISCELLANEOUS) ×3 IMPLANT
PLUG CATH AND CAP STER (CATHETERS) ×3 IMPLANT
PREP PVP WINGED SPONGE (MISCELLANEOUS) ×3 IMPLANT
PUMP PRECONNECT MS 18 LGX (Miscellaneous) ×1 IMPLANT
PUMP PRECONNECT MS 18CM LGX (Miscellaneous) ×3 IMPLANT
RESERVOIR 65ML PENILE PROS (Urological Implant) ×3 IMPLANT
RETRACTOR DEEP SCROTAL PENILE (Miscellaneous) ×3 IMPLANT
Rear tip extender ×2 IMPLANT
SKW DEEP SCROTAL RETRACTION SYSTEM ×2 IMPLANT
STRIP CLOSURE SKIN 1/2X4 (GAUZE/BANDAGES/DRESSINGS) ×2 IMPLANT
SUPPORETR ATHLETIC LG (MISCELLANEOUS) ×1 IMPLANT
SUPPORTER ATHLETIC LG (MISCELLANEOUS) ×3
SUT CHROMIC 3 0 PS 2 (SUTURE) IMPLANT
SUT CHROMIC 3 0 SH 27 (SUTURE) ×6 IMPLANT
SUT CHROMIC 4 0 RB 1X27 (SUTURE) IMPLANT
SUT PLAIN 3 0 SH 27IN (SUTURE) IMPLANT
SUT VIC AB 2-0 SH 27 (SUTURE) ×10
SUT VIC AB 2-0 SH 27XBRD (SUTURE) ×5 IMPLANT
SUT VIC AB 3-0 PS2 18 (SUTURE) ×3 IMPLANT
SUT VIC AB 4-0 FS2 27 (SUTURE) ×3 IMPLANT
SYR 20CC LL (SYRINGE) ×3 IMPLANT
SYR 50ML LL SCALE MARK (SYRINGE) ×9 IMPLANT
SYR BULB IRRIG 60ML STRL (SYRINGE) ×3 IMPLANT
SYRINGE 10CC LL (SYRINGE) ×6 IMPLANT
WATER STERILE IRR 1000ML POUR (IV SOLUTION) ×3 IMPLANT

## 2014-11-03 NOTE — Anesthesia Preprocedure Evaluation (Addendum)
Anesthesia Evaluation  Patient identified by MRN, date of birth, ID band Patient awake    Reviewed: Allergy & Precautions, NPO status , Patient's Chart, lab work & pertinent test results  Airway Mallampati: II       Dental no notable dental hx. (+) Teeth Intact   Pulmonary neg pulmonary ROS,    Pulmonary exam normal       Cardiovascular negative cardio ROS  Rhythm:Regular Rate:Normal     Neuro/Psych negative neurological ROS  negative psych ROS   GI/Hepatic negative GI ROS, Neg liver ROS,   Endo/Other  diabetes, Well Controlled, Type 2, Oral Hypoglycemic Agents  Renal/GU negative Renal ROS  negative genitourinary   Musculoskeletal negative musculoskeletal ROS (+)   Abdominal Normal abdominal exam  (+)   Peds negative pediatric ROS (+)  Hematology negative hematology ROS (+)   Anesthesia Other Findings   Reproductive/Obstetrics negative OB ROS                            Anesthesia Physical Anesthesia Plan  ASA: II  Anesthesia Plan: General   Post-op Pain Management:    Induction: Intravenous  Airway Management Planned: LMA  Additional Equipment:   Intra-op Plan:   Post-operative Plan: Extubation in OR  Informed Consent: I have reviewed the patients History and Physical, chart, labs and discussed the procedure including the risks, benefits and alternatives for the proposed anesthesia with the patient or authorized representative who has indicated his/her understanding and acceptance.     Plan Discussed with: CRNA  Anesthesia Plan Comments:         Anesthesia Quick Evaluation

## 2014-11-03 NOTE — Pre-Procedure Instructions (Signed)
Date of Initial H&P: 10/21/2014  History reviewed, patient examined, no change in status, stable for surgery.

## 2014-11-03 NOTE — Transfer of Care (Signed)
Immediate Anesthesia Transfer of Care Note  Patient: Billy Hardin  Procedure(s) Performed: Procedure(s): PENILE PROTHESIS INFLATABLE (N/A)  Patient Location: PACU  Anesthesia Type:General  Level of Consciousness: awake  Airway & Oxygen Therapy: Patient Spontanous Breathing and Patient connected to face mask oxygen  Post-op Assessment: Report given to RN and Post -op Vital signs reviewed and stable  Post vital signs: stable  Last Vitals:  Filed Vitals:   11/03/14 1456  BP: 139/82  Pulse: 67  Temp: 36.3 C  Resp: 14    Complications: No apparent anesthesia complications

## 2014-11-03 NOTE — Op Note (Signed)
Preoperative diagnosis: Erectile dysfunction Postoperative diagnosis: Erectile dysfunction  Procedure: Placement of inflatable 3 piece penile prosthesis   Surgeon: Otelia Limes. Yves Dill MD, FACS Anesthesia: Gen.  Indications:See the history and physical. After informed consent the above procedure(s) were requested     Technique and findings: After adequate general anesthesia had been obtained perineum was prepped for a full 15 minutes and then draped in the usual fashion. Foley catheter was inserted in a sterile fashion. A Scott retractor was placed. A midline raphae incision was made at the penoscrotal junction and carried down sharply to the corpora cavernosa. The corpora cavernosa were fully exposed. Stay sutures were placed in both corpora. An incision was made in the left corpus cavernosum. The corpus cavernosum was then dilated up to 15 mm with the Hegar dilators. The left corpus cavernosum was then irrigated with GU irrigant. Measurement of the corpus cavernosum was then taken and noted be 10 cm proximally and 10 cm distally. This procedure was then performed in an identical fashion on the right corpus cavernosum. Measurement on this side was identical. The 18 cm cylinders with 2 cm rear tip extenders were selected. The cylinders were then placed using the [facility] inserter. Surrogate reservoir test was performed. The cylinders held approximately 30 cc of saline. Erection was noted to be straight without SST deformity. The left corpus cavernosum was then closed with running and locking 2-0 Vicryl suture. The preplaced 2-0 Vicryl stay sutures were tied over the initial closure. Right corpus cavernosum was closed in an identical fashion. A pocket in the inferior aspect of the scrotum was then created using blunt dissection. A Ray-Tec sponge soaked in GU irrigant was placed in this space. A small Deaver retractor was placed over the right external inguinal ring and the transversalis fascia was punctured at the  pubic tubercle level with a curved Mayo scissor. The retropubic space was then created created with finger dissection. The retropubic space was then irrigated with GU irrigant. The 65 cc reservoir was placed into the retropubic space. The reservoir was filled with 50 cc of sterile saline. No back pressure was observed. At this point the preplaced Ray-Tec sponge was removed and the pump placed into this position and anchored with a Babcock clamp. The cylinder assembly was connected to the reservoir with a straight connector. The prosthesis was cycled and excellent rigid and flaccid conditions were noted. The incision was then irrigated with GU irrigant. The incision was then closed in layers consisting of 3-0 Vicryl for the dartos fascia followed by 2-0 Vicryl interrupted skin sutures. Sterile dressing and scrotal supporter were applied. Sponge, needle and instrument counts were noted be correct. The procedure was then terminated and the patient was transferred to the recovery room in stable condition.

## 2014-11-03 NOTE — Anesthesia Procedure Notes (Signed)
Procedure Name: LMA Insertion Date/Time: 11/03/2014 1:16 PM Performed by: Aline Brochure Pre-anesthesia Checklist: Patient identified, Emergency Drugs available, Suction available and Patient being monitored Patient Re-evaluated:Patient Re-evaluated prior to inductionOxygen Delivery Method: Circle system utilized Preoxygenation: Pre-oxygenation with 100% oxygen Intubation Type: IV induction Ventilation: Mask ventilation without difficulty LMA: LMA inserted LMA Size: 4.5 Number of attempts: 1 Airway Equipment and Method: Patient positioned with wedge pillow Placement Confirmation: positive ETCO2 and breath sounds checked- equal and bilateral Tube secured with: Tape Dental Injury: Teeth and Oropharynx as per pre-operative assessment

## 2014-11-04 DIAGNOSIS — N529 Male erectile dysfunction, unspecified: Secondary | ICD-10-CM | POA: Diagnosis not present

## 2014-11-04 LAB — GLUCOSE, CAPILLARY
GLUCOSE-CAPILLARY: 154 mg/dL — AB (ref 65–99)
Glucose-Capillary: 135 mg/dL — ABNORMAL HIGH (ref 65–99)
Glucose-Capillary: 123 mg/dL — ABNORMAL HIGH (ref 65–99)

## 2014-11-04 MED ORDER — HYDROCODONE-ACETAMINOPHEN 5-325 MG PO TABS: 1.0000 | ORAL_TABLET | ORAL | Status: DC | PRN

## 2014-11-04 MED ORDER — LEVOFLOXACIN 500 MG PO TABS: 500.0000 mg | ORAL_TABLET | Freq: Every day | ORAL | Status: DC

## 2014-11-04 MED ORDER — BISACODYL 10 MG RE SUPP: 10.0000 mg | Freq: Every day | RECTAL | Status: DC | PRN

## 2014-11-04 NOTE — Progress Notes (Signed)
Foley discontinued this am per M.D. Orders. Pt is due to void. Pt verbalized understanding to notify staff promptly after voiding since foley removal.

## 2014-11-04 NOTE — Discharge Summary (Signed)
See op note, progress note and discharge instructions.

## 2014-11-04 NOTE — Progress Notes (Signed)
Patient has no complaints today. Minimal discomfort.  Examination indicated that the incision was intact and there was no ecchymosis or fluctuance. Patient was given postoperative instructions and will be discharged this morning. He was instructed to follow-up with me in the office in 1 week

## 2014-11-04 NOTE — Discharge Instructions (Signed)
NO heavy lifting for 6 weeks. Shower tomorrow. No straddling for 6 weeks. Scrotal supporter for 2 weeks.

## 2014-11-09 ENCOUNTER — Encounter: Payer: Self-pay | Admitting: Urology

## 2014-11-23 NOTE — Anesthesia Postprocedure Evaluation (Signed)
  Anesthesia Post-op Note  Patient: Billy Hardin  Procedure(s) Performed: Procedure(s): PENILE PROTHESIS INFLATABLE (N/A)  Anesthesia type:General  Patient location: PACU  Post pain: Pain level controlled  Post assessment: Post-op Vital signs reviewed, Patient's Cardiovascular Status Stable, Respiratory Function Stable, Patent Airway and No signs of Nausea or vomiting  Post vital signs: Reviewed and stable  Last Vitals:  Filed Vitals:   11/04/14 0800  BP: 150/88  Pulse: 84  Temp: 36.4 C  Resp: 12    Level of consciousness: awake, alert  and patient cooperative  Complications: No apparent anesthesia complications

## 2015-01-20 ENCOUNTER — Other Ambulatory Visit: Payer: Self-pay | Admitting: Family Medicine

## 2015-04-13 ENCOUNTER — Other Ambulatory Visit: Payer: Self-pay | Admitting: Family Medicine

## 2015-04-20 ENCOUNTER — Other Ambulatory Visit: Payer: Self-pay | Admitting: Family Medicine

## 2015-05-24 ENCOUNTER — Encounter: Payer: Self-pay | Admitting: Family Medicine

## 2015-05-25 ENCOUNTER — Encounter: Payer: Self-pay | Admitting: Family Medicine

## 2015-05-25 DIAGNOSIS — A6000 Herpesviral infection of urogenital system, unspecified: Secondary | ICD-10-CM | POA: Insufficient documentation

## 2015-05-25 DIAGNOSIS — Z87442 Personal history of urinary calculi: Secondary | ICD-10-CM | POA: Insufficient documentation

## 2015-05-25 DIAGNOSIS — E291 Testicular hypofunction: Secondary | ICD-10-CM | POA: Insufficient documentation

## 2015-05-25 DIAGNOSIS — F439 Reaction to severe stress, unspecified: Secondary | ICD-10-CM | POA: Insufficient documentation

## 2015-05-26 ENCOUNTER — Encounter: Payer: Self-pay | Admitting: Family Medicine

## 2015-05-26 ENCOUNTER — Ambulatory Visit (INDEPENDENT_AMBULATORY_CARE_PROVIDER_SITE_OTHER): Payer: BLUE CROSS/BLUE SHIELD | Admitting: Family Medicine

## 2015-05-26 VITALS — BP 122/64 | HR 80 | Temp 98.0°F | Resp 16 | Ht 72.75 in | Wt 179.4 lb

## 2015-05-26 DIAGNOSIS — G4700 Insomnia, unspecified: Secondary | ICD-10-CM | POA: Diagnosis not present

## 2015-05-26 DIAGNOSIS — Z23 Encounter for immunization: Secondary | ICD-10-CM

## 2015-05-26 DIAGNOSIS — Z0000 Encounter for general adult medical examination without abnormal findings: Secondary | ICD-10-CM

## 2015-05-26 DIAGNOSIS — J3489 Other specified disorders of nose and nasal sinuses: Secondary | ICD-10-CM | POA: Diagnosis not present

## 2015-05-26 DIAGNOSIS — E221 Hyperprolactinemia: Secondary | ICD-10-CM | POA: Diagnosis not present

## 2015-05-26 DIAGNOSIS — E119 Type 2 diabetes mellitus without complications: Secondary | ICD-10-CM

## 2015-05-26 MED ORDER — FLUTICASONE PROPIONATE 50 MCG/ACT NA SUSP: 2.0000 | Freq: Every day | NASAL | Status: DC

## 2015-05-26 MED ORDER — CLONAZEPAM 0.5 MG PO TABS: 0.5000 mg | ORAL_TABLET | Freq: Two times a day (BID) | ORAL | Status: DC | PRN

## 2015-05-26 NOTE — Patient Instructions (Signed)
We will call you with the lab results. 

## 2015-05-26 NOTE — Progress Notes (Signed)
Subjective:     Patient ID: Billy Hardin, male   DOB: [birth year], 65 y.o.   MRN: [id number]  HPI  Chief Complaint  Patient presents with  . Annual Exam    Patient is present in office today for his anuual physical, he states that he would like to discuss with provider insomnia for the past 30days or more. Patient states on average he is only getting 3-4hrs of sleep at night, patient is due today for Pneumovax he wishes to discuss vaccine with provider. Last reported Tdap-01/16/07, Flu vaccine- 02/2015, Zostavax-06/04/2012, Colonoscopy-08/07/2013 (3 polyps found and removed and internal hemorrhoids were noted, PSA-04/23/2014 results were 1.0ng/mL.  Admits to continued job stress and states that clonazepam helped him the most in the past with both stress and sleep. States he awakens early despite taking Tylenol PM. Usual sleep time is 11-5:30.   Review of Systems General: Feeling well; AM sugars are generally below 140. HEENT: regular dental visits and eye exams (glasses).Reports persistent paranasal sinus pressure without drainage. States he had a cold last month but pressure persists. Hearing loss with use of hearing aid in his left ear. Cardiovascular: no chest pain, shortness of breath, or palpitations GI: no heartburn, no change in bowel habits. He will be due f/u colonoscopy in March of 2018. GU: nocturia x 0 no change in bladder habits. Reports that his penile prosthesis  inflate when he is bending or lifting at work. Will f/u with Dr. Yves Dill regarding this.  Psychiatric: not depressed Musculoskeletal: hx of left knee osteoarthritis.States when he wears a Velco brace he no longer feels pain. Reports prior orthopedic evaluation.    Objective:   Physical Exam  Constitutional: He appears well-developed and well-nourished. No distress.  Eyes: PERRLA, EOMI Neck: no thyromegaly, tenderness or nodules,  ENT: TM's intact without inflammation; No tonsillar enlargement or exudate, Lungs:  Clear Heart : RRR without murmur or gallop Abd: bowel sounds present, soft, non-tender, no organomegaly Rectal: Prostate firm and non-tender Extremities: no edema, pedal pulses intact; sensation intact to monofilament     Assessment:    1. Annual physical exam - Comprehensive metabolic panel - PSA  2. Hyperprolactinemia (HCC) - Prolactin  3. Insomnia: wishes to try clonazepam again for both stress and sleep. - clonazePAM (KLONOPIN) 0.5 MG tablet; Take 1 tablet (0.5 mg total) by mouth 2 (two) times daily as needed for anxiety (for stress or trouble sleeping).  Dispense: 28 tablet; Refill: 0  4. Type 2 diabetes mellitus without complication, without long-term current use of insulin (HCC) - Lipid panel - HgB A1c - Microalbumin / creatinine urine ratio  5. Sinus pressure - fluticasone (FLONASE) 50 MCG/ACT nasal spray; Place 2 sprays into both nostrils daily.  Dispense: 16 g; Refill: 0  6. Need for vaccination for Strep pneumoniae - Pneumococcal polysaccharide vaccine 23-valent greater than or equal to 2yo subcutaneous/IM     Plan:    Will call with lab results and in two weeks.

## 2015-05-27 ENCOUNTER — Telehealth: Payer: Self-pay

## 2015-05-27 ENCOUNTER — Other Ambulatory Visit: Payer: Self-pay | Admitting: Family Medicine

## 2015-05-27 LAB — COMPREHENSIVE METABOLIC PANEL
A/G RATIO: 2.1 (ref 1.1–2.5)
ALK PHOS: 53 IU/L (ref 39–117)
ALT: 25 IU/L (ref 0–44)
AST: 19 IU/L (ref 0–40)
Albumin: 4.5 g/dL (ref 3.6–4.8)
BILIRUBIN TOTAL: 0.6 mg/dL (ref 0.0–1.2)
BUN/Creatinine Ratio: 16 (ref 10–22)
BUN: 15 mg/dL (ref 8–27)
CHLORIDE: 102 mmol/L (ref 96–106)
CO2: 22 mmol/L (ref 18–29)
Calcium: 9.3 mg/dL (ref 8.6–10.2)
Creatinine, Ser: 0.93 mg/dL (ref 0.76–1.27)
GFR calc Af Amer: 100 mL/min/{1.73_m2} (ref >59)
GFR calc non Af Amer: 86 mL/min/{1.73_m2} (ref >59)
GLUCOSE: 129 mg/dL — AB (ref 65–99)
Globulin, Total: 2.1 g/dL (ref 1.5–4.5)
POTASSIUM: 4 mmol/L (ref 3.5–5.2)
Sodium: 143 mmol/L (ref 134–144)
Total Protein: 6.6 g/dL (ref 6.0–8.5)

## 2015-05-27 LAB — LIPID PANEL
CHOLESTEROL TOTAL: 140 mg/dL (ref 100–199)
Chol/HDL Ratio: 2.3 ratio units (ref 0.0–5.0)
HDL: 60 mg/dL (ref >39)
LDL Calculated: 61 mg/dL (ref 0–99)
TRIGLYCERIDES: 94 mg/dL (ref 0–149)
VLDL CHOLESTEROL CAL: 19 mg/dL (ref 5–40)

## 2015-05-27 LAB — MICROALBUMIN / CREATININE URINE RATIO
Creatinine, Urine: 184.4 mg/dL
Creatinine, Urine: 193.7 mg/dL
MICROALB/CREAT RATIO: 3.5 mg/g{creat} (ref 0.0–30.0)
MICROALB/CREAT RATIO: 4.3 mg/g creat (ref 0.0–30.0)
Microalbumin, Urine: 6.7 ug/mL
Microalbumin, Urine: 7.9 ug/mL

## 2015-05-27 LAB — HEMOGLOBIN A1C
Est. average glucose Bld gHb Est-mCnc: 143 mg/dL
Hgb A1c MFr Bld: 6.6 % — ABNORMAL HIGH (ref 4.8–5.6)

## 2015-05-27 LAB — PSA: Prostate Specific Ag, Serum: 1.4 ng/mL (ref 0.0–4.0)

## 2015-05-27 LAB — PROLACTIN: PROLACTIN: 5.4 ng/mL (ref 4.0–15.2)

## 2015-05-27 NOTE — Telephone Encounter (Signed)
-----   Message from Carmon Ginsberg, [place] sent at 05/27/2015  7:57 AM EST ----- Labs look good with A1c showing control of diabetes. Still waiting for urine test results and will call when those come in. You no longer have an elevated prolactin so will take that off your problem list.

## 2015-05-27 NOTE — Telephone Encounter (Signed)
-----   Message from Carmon Ginsberg, [place] sent at 05/27/2015 11:49 AM EST ----- Urine is ok-no sign of early kidney damage.

## 2015-05-27 NOTE — Telephone Encounter (Signed)
Patient has been advised. KW 

## 2015-05-27 NOTE — Telephone Encounter (Signed)
Spoke with patient on the phone and advised him of lab report, he states that prescription for Clonazepam was not at pharmacy. I called in prescription to [facility] in [place], patient has been notified. KW

## 2015-06-09 ENCOUNTER — Encounter: Payer: Self-pay | Admitting: Family Medicine

## 2015-06-09 ENCOUNTER — Ambulatory Visit (INDEPENDENT_AMBULATORY_CARE_PROVIDER_SITE_OTHER): Payer: BLUE CROSS/BLUE SHIELD | Admitting: Family Medicine

## 2015-06-09 VITALS — BP 108/52 | HR 79 | Temp 97.5°F | Resp 16 | Wt 182.2 lb

## 2015-06-09 DIAGNOSIS — G4700 Insomnia, unspecified: Secondary | ICD-10-CM | POA: Diagnosis not present

## 2015-06-09 DIAGNOSIS — Z658 Other specified problems related to psychosocial circumstances: Secondary | ICD-10-CM | POA: Diagnosis not present

## 2015-06-09 DIAGNOSIS — F439 Reaction to severe stress, unspecified: Secondary | ICD-10-CM

## 2015-06-09 MED ORDER — CLONAZEPAM 0.5 MG PO TABS: ORAL_TABLET | ORAL | Status: DC

## 2015-06-09 NOTE — Patient Instructions (Signed)
If you are doing ok on the clonazepam, you may call for refills in two months. If not doing ok come in for an office visit.

## 2015-06-09 NOTE — Progress Notes (Signed)
Subjective:     Patient ID: Billy Hardin, male   DOB: [birth year], 65 y.o.   MRN: [id number]  HPI  Chief Complaint  Patient presents with  . Follow-up    Patient presents in office today for follow up visit, last office visit patient was prescribed Clonazepam 0.5mg  to help with stress and insomnia. Patient states that medication has helped with his sleep patterns and staying asleep throughout the night but in the morning he states that he feels groggy and having hard time concentrating on daily task.   States he is able to get to sleep more quickly and not awaken so early in the AM. He has tried 1/2 of a pill in the  AM and the additional half at midday which works better for him. 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 States his boss is aware of the problem but is hesitant to take any action due to a threat of a racial discrimination suit. He does not see retirement as an option until 45 due to financial concerns. He vents his stress by keeping documentation of his work struggles.   Review of Systems     Objective:   Physical Exam  Constitutional: He appears well-developed and well-nourished.  Psychiatric: He has a normal mood and affect. His behavior is normal.       Assessment:    1. Insomnia - clonazePAM (KLONOPIN) 0.5 MG tablet; 1/2 to one during the day for stress; one at bedtime to help with sleep  Dispense: 60 tablet; Refill: 1  2. Situational stress - clonazePAM (KLONOPIN) 0.5 MG tablet; 1/2 to one during the day for stress; one at bedtime to help with sleep  Dispense: 60 tablet; Refill: 1    Plan:    Will return in 2 months or sooner as needed.

## 2015-06-30 ENCOUNTER — Telehealth: Payer: Self-pay | Admitting: Family Medicine

## 2015-06-30 NOTE — Telephone Encounter (Signed)
Billy Hardin with [facility] in [place] called to have [place] change his diabetic supplies to One Touch Ultra b/c the insurance won't cover the other brand of supplies. Billy Hardin would like an RX for One Touch Ultra meter, strips, & lancets. I advised that [name] has left for the day. Thanks TNP

## 2015-07-01 ENCOUNTER — Other Ambulatory Visit: Payer: Self-pay | Admitting: Family Medicine

## 2015-07-01 DIAGNOSIS — E119 Type 2 diabetes mellitus without complications: Secondary | ICD-10-CM

## 2015-07-01 MED ORDER — BLOOD GLUCOSE MONITOR KIT: PACK | Status: AC

## 2015-07-01 NOTE — Telephone Encounter (Signed)
I have faxed in the order for generic glucometer/supplies so pharmacist can choose the one that is covered.

## 2015-07-01 NOTE — Telephone Encounter (Signed)
Okay to approve change of diabetic supplies?

## 2015-07-01 NOTE — Telephone Encounter (Signed)
Patient has been advised. KW 

## 2015-07-04 ENCOUNTER — Encounter: Payer: Self-pay | Admitting: Emergency Medicine

## 2015-07-04 ENCOUNTER — Emergency Department: Payer: BLUE CROSS/BLUE SHIELD

## 2015-07-04 ENCOUNTER — Emergency Department
Admission: EM | Admit: 2015-07-04 | Discharge: 2015-07-04 | Disposition: A | Discharge status: Home / Self Care | Payer: BLUE CROSS/BLUE SHIELD | Attending: Emergency Medicine | Admitting: Emergency Medicine

## 2015-07-04 DIAGNOSIS — Y9289 Other specified places as the place of occurrence of the external cause: Secondary | ICD-10-CM | POA: Insufficient documentation

## 2015-07-04 DIAGNOSIS — W19XXXA Unspecified fall, initial encounter: Secondary | ICD-10-CM

## 2015-07-04 DIAGNOSIS — S060X1A Concussion with loss of consciousness of 30 minutes or less, initial encounter: Secondary | ICD-10-CM | POA: Insufficient documentation

## 2015-07-04 DIAGNOSIS — S0083XA Contusion of other part of head, initial encounter: Secondary | ICD-10-CM | POA: Insufficient documentation

## 2015-07-04 DIAGNOSIS — Y998 Other external cause status: Secondary | ICD-10-CM | POA: Insufficient documentation

## 2015-07-04 DIAGNOSIS — S0081XA Abrasion of other part of head, initial encounter: Secondary | ICD-10-CM | POA: Insufficient documentation

## 2015-07-04 DIAGNOSIS — S6991XA Unspecified injury of right wrist, hand and finger(s), initial encounter: Secondary | ICD-10-CM | POA: Diagnosis present

## 2015-07-04 DIAGNOSIS — E119 Type 2 diabetes mellitus without complications: Secondary | ICD-10-CM | POA: Insufficient documentation

## 2015-07-04 DIAGNOSIS — S93401A Sprain of unspecified ligament of right ankle, initial encounter: Secondary | ICD-10-CM | POA: Insufficient documentation

## 2015-07-04 DIAGNOSIS — W11XXXA Fall on and from ladder, initial encounter: Secondary | ICD-10-CM | POA: Insufficient documentation

## 2015-07-04 DIAGNOSIS — Y9389 Activity, other specified: Secondary | ICD-10-CM | POA: Insufficient documentation

## 2015-07-04 DIAGNOSIS — S52514A Nondisplaced fracture of right radial styloid process, initial encounter for closed fracture: Secondary | ICD-10-CM | POA: Insufficient documentation

## 2015-07-04 DIAGNOSIS — S52511A Displaced fracture of right radial styloid process, initial encounter for closed fracture: Secondary | ICD-10-CM

## 2015-07-04 MED ORDER — NAPROXEN 500 MG PO TABS: 500.0000 mg | ORAL_TABLET | Freq: Two times a day (BID) | ORAL | Status: DC

## 2015-07-04 MED ORDER — ONDANSETRON 8 MG PO TBDP
8.0000 mg | ORAL_TABLET | Freq: Once | ORAL | Status: AC
  Administered 2015-07-04: 8 mg via ORAL
  Filled 2015-07-04: qty 1

## 2015-07-04 MED ORDER — KETOROLAC TROMETHAMINE 30 MG/ML IJ SOLN
30.0000 mg | Freq: Once | INTRAMUSCULAR | Status: AC
  Administered 2015-07-04: 30 mg via INTRAVENOUS
  Filled 2015-07-04: qty 1

## 2015-07-04 MED ORDER — ONDANSETRON 8 MG PO TBDP: 8.0000 mg | ORAL_TABLET | Freq: Three times a day (TID) | ORAL | Status: DC | PRN

## 2015-07-04 NOTE — ED Provider Notes (Signed)
[facility] Emergency Department Provider Note  ____________________________________________  Time seen: 7:20 PM  I have reviewed the triage vital signs and the nursing notes.   HISTORY  Chief Complaint Fall    HPI Billy Hardin is a 65 y.o. male brought to the ED after falling approximately 10 feet off a ladder. He states that he was trying to come back down the ladder when it slid out from under him and he fell onto a wooden deck. He hit the right side of his head and lost consciousness. Wife reports he was unconscious for about 3 minutes. Complains of pain in the right side of his head right wrist and right ankle. Denies neck pain or spinal pain or back pain. Denies any weakness anywhere or vision changes.     Past Medical History  Diagnosis Date  . Diabetes mellitus without complication ([place])   . TMJ (dislocation of temporomandibular joint)   . Elevated lipids   . Seasonal allergies   . Herpes   . Complication of anesthesia     had catheter 4 days unable to void  . Deaf     Rt ear  . Uses hearing aid     Left  . Insomnia      Patient Active Problem List   Diagnosis Date Noted  . Male hypogonadism 05/25/2015  . Genital herpes 05/25/2015  . Diabetes mellitus ([place]) 05/25/2015  . History of kidney stones 05/25/2015  . Erectile dysfunction associated with type 2 diabetes mellitus ([place]) 11/03/2014  . ASNHL (asymmetrical sensorineural hearing loss) 09/15/2014  . H/O adenomatous polyp of colon 08/07/2013  . Insomnia 01/21/2008  . ED (erectile dysfunction) of organic origin 02/04/2007  . Family history of cardiovascular disease 02/04/2007     Past Surgical History  Procedure Laterality Date  . Middle ear surgery    . Kidney stone surgery    . Penile prosthesis implant N/A 11/03/2014    Procedure: PENILE PROTHESIS INFLATABLE;  Surgeon: Royston Cowper, MD;  Location: [facility] ORS;  Service: Urology;  Laterality: N/A;  . Tonsillectomy and  adenoidectomy       Current Outpatient Rx  Name  Route  Sig  Dispense  Refill  . acetaminophen (TYLENOL) 325 MG tablet   Oral   Take by mouth.         [name] atorvastatin (LIPITOR) 20 MG tablet      TAKE 1 TABLET BY MOUTH EVERY DAY.   30 tablet   5   . BAYER CONTOUR TEST test strip      USE AS DIRECTED TWICE DAILY   100 each   3   . blood glucose meter kit and supplies KIT      Dispense based on patient and insurance preference. Check sugar daily   1 each   0   . CINNAMON PO   Oral   Take 2 capsules by mouth daily.         . clonazePAM (KLONOPIN) 0.5 MG tablet      1/2 to one during the day for stress; one at bedtime to help with sleep   60 tablet   1   . fluticasone (FLONASE) 50 MCG/ACT nasal spray   Each Nare   Place 2 sprays into both nostrils daily.   16 g   0   . metFORMIN (GLUCOPHAGE) 500 MG tablet      TAKE ONE TABLET BY MOUTH TWICE A DAY WITH MEALS   180 tablet   1  TIME FOR OFFICE VISIT   . Omega-3 Fatty Acids (FISH OIL) 1000 MG CAPS   Oral   Take 1 capsule by mouth daily.         . valACYclovir (VALTREX) 500 MG tablet   Oral   Take 500 mg by mouth 3 (three) times a week. Mon, wed, fri         . vitamin C (ASCORBIC ACID) 500 MG tablet   Oral   Take 500 mg by mouth daily.         . vitamin E 400 UNIT capsule   Oral   Take 400 Units by mouth 2 (two) times daily.            Allergies Penicillins   Family History  Problem Relation Age of Onset  . Cancer Mother   . CAD Brother     Social History Social History  Substance Use Topics  . Smoking status: Never Smoker   . Smokeless tobacco: Never Used  . Alcohol Use: 0.0 oz/week    0 Standard drinks or equivalent per week     Comment: 1-2 drinks total of 2-3x a year    Review of Systems  Constitutional:   No fever or chills. No weight changes Eyes:   No blurry vision or double vision.  ENT:   No sore throat.  Cardiovascular:   No chest pain. Respiratory:   No  dyspnea or cough. Gastrointestinal:   Negative for abdominal pain, vomiting and diarrhea.  No BRBPR or melena. Genitourinary:   Negative for dysuria or difficulty urinating. Musculoskeletal:   Right ankle and wrist pain. Skin:   Negative for rash. Neurological:   Right-sided headache without weakness or paresthesia. Psychiatric:  No anxiety or depression.   Endocrine:  No changes in energy or sleep difficulty.  10-point ROS otherwise negative.  ____________________________________________   PHYSICAL EXAM:  VITAL SIGNS: ED Triage Vitals  Enc Vitals Group     BP 07/04/15 1816 147/70 mmHg     Pulse Rate 07/04/15 1816 83     Resp 07/04/15 1816 18     Temp 07/04/15 1816 98.5 F (36.9 C)     Temp Source 07/04/15 1816 Oral     SpO2 07/04/15 1816 97 %     Weight 07/04/15 1816 170 lb (77.111 kg)     Height 07/04/15 1816 6' (1.829 m)     Head Cir --      Peak Flow --      Pain Score 07/04/15 1816 10     Pain Loc --      Pain Edu? --      Excl. in [place]? --     Vital signs reviewed, nursing assessments reviewed.   Constitutional:   Alert and oriented. Groggy appearing..  no distress. Eyes:   No scleral icterus. No conjunctival pallor. PERRL. EOMI ENT   Head:   Normocephalic with abrasion and small hematoma over the right parietal skull. No laceration. No hemotympanum..   Nose:   No congestion/rhinnorhea. No septal hematoma   Mouth/Throat:   MMM, no pharyngeal erythema. No peritonsillar mass. No intraoral injury.   Neck:   No stridor. No SubQ emphysema. No meningismus. C-collar in place initially. As CT scan was available and reviewed prior to exam, I then removed the c-collar. No midline spinal tenderness, full range of motion  Hematological/Lymphatic/Immunilogical:   No cervical lymphadenopathy. Cardiovascular:   RRR. Symmetric bilateral radial and DP pulses.  No murmurs.  Respiratory:   Normal  respiratory effort without tachypnea nor retractions. Breath sounds are  clear and equal bilaterally. No wheezes/rales/rhonchi. Gastrointestinal:   Soft and nontender. Non distended. There is no CVA tenderness.  No rebound, rigidity, or guarding. Genitourinary:   deferred Musculoskeletal:   No midline spinal tenderness. Mild tenderness in the right snuffbox. Mild tenderness at the right lateral malleolus of the ankle without any step-off or instability. Full range of motion in all joints. Neurologic:   Normal speech and language.  CN 2-10 normal. Motor grossly intact. No gross focal neurologic deficits are appreciated.  Skin:    Skin is warm, dry and intact. No rash noted.  No petechiae, purpura, or bullae. Psychiatric:   Mood and affect are normal. ____________________________________________    LABS (pertinent positives/negatives) (all labs ordered are listed, but only abnormal results are displayed) Labs Reviewed - No data to display ____________________________________________   EKG    ____________________________________________    RADIOLOGY  CT head and cervical spine unremarkable X-ray right ankle unremarkable X-ray right wrist shows radial styloid fracture, nondisplaced.  ____________________________________________   PROCEDURES SPLINT APPLICATION Date/Time: 6:43 PM Authorized by: Carrie Mew Consent: Verbal consent obtained. Risks and benefits: risks, benefits and alternatives were discussed Consent given by: patient Splint applied by: Myself and orthopedic technician Location details: Right hand wrist forearm  Splint type: Thumb spica  Supplies used: Ortho-Glass  Post-procedure: The splinted body part was neurovascularly unchanged following the procedure. Patient tolerance: Patient tolerated the procedure well with no immediate complications.     ____________________________________________   INITIAL IMPRESSION / ASSESSMENT AND PLAN / ED COURSE  Pertinent labs & imaging results that were available during my care of  the patient were reviewed by me and considered in my medical decision making (see chart for details).  Patient presents with blunt trauma, concussion with LOC. Found to have radial styloid fracture but no other acute traumatic injuries. Counseled on concussion, thumb spica applied. Follow-up with PCP and orthopedic. Avoid sedatives, avoid driving ladders and other potentially dangerous tasks.     ____________________________________________   FINAL CLINICAL IMPRESSION(S) / ED DIAGNOSES  Final diagnoses:  Radial styloid fracture, right, closed, initial encounter  Concussion, with loss of consciousness of 30 minutes or less, initial encounter  Right ankle sprain, initial encounter      Carrie Mew, MD 07/04/15 2024

## 2015-07-04 NOTE — ED Notes (Addendum)
Pt reports falling from 10-12 feet off ladder. Pt did hit head with positive LOC; wife reports pt was unconscious for about 3 min; had to sternal rub for him to open eyes.  C/o pain to right side of head, right wrist, and right ankle.

## 2015-07-04 NOTE — ED Notes (Signed)
[place] collar applied in triage

## 2015-07-04 NOTE — Discharge Instructions (Signed)
Ankle Sprain °An ankle sprain is an injury to the strong, fibrous tissues (ligaments) that hold the bones of your ankle joint together.  °CAUSES °An ankle sprain is usually caused by a fall or by twisting your ankle. Ankle sprains most commonly occur when you step on the outer edge of your foot, and your ankle turns inward. People who participate in sports are more prone to these types of injuries.  °SYMPTOMS  °· Pain in your ankle. The pain may be present at rest or only when you are trying to stand or walk. °· Swelling. °· Bruising. Bruising may develop immediately or within 1 to 2 days after your injury. °· Difficulty standing or walking, particularly when turning corners or changing directions. °DIAGNOSIS  °Your caregiver will ask you details about your injury and perform a physical exam of your ankle to determine if you have an ankle sprain. During the physical exam, your caregiver will press on and apply pressure to specific areas of your foot and ankle. Your caregiver will try to move your ankle in certain ways. An X-ray exam may be done to be sure a bone was not broken or a ligament did not separate from one of the bones in your ankle (avulsion fracture).  °TREATMENT  °Certain types of braces can help stabilize your ankle. Your caregiver can make a recommendation for this. Your caregiver may recommend the use of medicine for pain. If your sprain is severe, your caregiver may refer you to a surgeon who helps to restore function to parts of your skeletal system (orthopedist) or a physical therapist. °HOME CARE INSTRUCTIONS  °· Apply ice to your injury for 1-2 days or as directed by your caregiver. Applying ice helps to reduce inflammation and pain. °· Put ice in a plastic bag. °· Place a towel between your skin and the bag. °· Leave the ice on for 15-20 minutes at a time, every 2 hours while you are awake. °· Only take over-the-counter or prescription medicines for pain, discomfort, or fever as directed by  your caregiver. °· Elevate your injured ankle above the level of your heart as much as possible for 2-3 days. °· If your caregiver recommends crutches, use them as instructed. Gradually put weight on the affected ankle. Continue to use crutches or a cane until you can walk without feeling pain in your ankle. °· If you have a plaster splint, wear the splint as directed by your caregiver. Do not rest it on anything harder than a pillow for the first 24 hours. Do not put weight on it. Do not get it wet. You may take it off to take a shower or bath. °· You may have been given an elastic bandage to wear around your ankle to provide support. If the elastic bandage is too tight (you have numbness or tingling in your foot or your foot becomes cold and blue), adjust the bandage to make it comfortable. °· If you have an air splint, you may blow more air into it or let air out to make it more comfortable. You may take your splint off at night and before taking a shower or bath. Wiggle your toes in the splint several times per day to decrease swelling. °SEEK MEDICAL CARE IF:  °· You have rapidly increasing bruising or swelling. °· Your toes feel extremely cold or you lose feeling in your foot. °· Your pain is not relieved with medicine. °SEEK IMMEDIATE MEDICAL CARE IF: °· Your toes are numb or blue. °·   You have severe pain that is increasing. MAKE SURE YOU:   Understand these instructions.  Will watch your condition.  Will get help right away if you are not doing well or get worse.   This information is not intended to replace advice given to you by your health care provider. Make sure you discuss any questions you have with your health care provider.   Document Released: 05/01/2005 Document Revised: 05/22/2014 Document Reviewed: 05/13/2011 Elsevier Interactive Patient Education 2016 [place], Adult A concussion, or closed-head injury, is a brain injury caused by a direct blow to the head or by  a quick and sudden movement (jolt) of the head or neck. Concussions are usually not life-threatening. Even so, the effects of a concussion can be serious. If you have had a concussion before, you are more likely to experience concussion-like symptoms after a direct blow to the head.  CAUSES  Direct blow to the head, such as from running into another player during a soccer game, being hit in a fight, or hitting your head on a hard surface.  A jolt of the head or neck that causes the brain to move back and forth inside the skull, such as in a car crash. SIGNS AND SYMPTOMS The signs of a concussion can be hard to notice. Early on, they may be missed by you, family members, and health care providers. You may look fine but act or feel differently. Symptoms are usually temporary, but they may last for days, weeks, or even longer. Some symptoms may appear right away while others may not show up for hours or days. Every head injury is different. Symptoms include:  Mild to moderate headaches that will not go away.  A feeling of pressure inside your head.  Having more trouble than usual:  Learning or remembering things you have heard.  Answering questions.  Paying attention or concentrating.  Organizing daily tasks.  Making decisions and solving problems.  Slowness in thinking, acting or reacting, speaking, or reading.  Getting lost or being easily confused.  Feeling tired all the time or lacking energy (fatigued).  Feeling drowsy.  Sleep disturbances.  Sleeping more than usual.  Sleeping less than usual.  Trouble falling asleep.  Trouble sleeping (insomnia).  Loss of balance or feeling lightheaded or dizzy.  Nausea or vomiting.  Numbness or tingling.  Increased sensitivity to:  Sounds.  Lights.  Distractions.  Vision problems or eyes that tire easily.  Diminished sense of taste or smell.  Ringing in the ears.  Mood changes such as feeling sad or  anxious.  Becoming easily irritated or angry for little or no reason.  Lack of motivation.  Seeing or hearing things other people do not see or hear (hallucinations). DIAGNOSIS Your health care provider can usually diagnose a concussion based on a description of your injury and symptoms. He or she will ask whether you passed out (lost consciousness) and whether you are having trouble remembering events that happened right before and during your injury. Your evaluation might include:  A brain scan to look for signs of injury to the brain. Even if the test shows no injury, you may still have a concussion.  Blood tests to be sure other problems are not present. TREATMENT  Concussions are usually treated in an emergency department, in urgent care, or at a clinic. You may need to stay in the hospital overnight for further treatment.  Tell your health care provider if you are taking any medicines,  including prescription medicines, over-the-counter medicines, and natural remedies. Some medicines, such as blood thinners (anticoagulants) and aspirin, may increase the chance of complications. Also tell your health care provider whether you have had alcohol or are taking illegal drugs. This information may affect treatment.  Your health care provider will send you home with important instructions to follow.  How fast you will recover from a concussion depends on many factors. These factors include how severe your concussion is, what part of your brain was injured, your age, and how healthy you were before the concussion.  Most people with mild injuries recover fully. Recovery can take time. In general, recovery is slower in older persons. Also, persons who have had a concussion in the past or have other medical problems may find that it takes longer to recover from their current injury. HOME CARE INSTRUCTIONS General Instructions  Carefully follow the directions your health care provider gave  you.  Only take over-the-counter or prescription medicines for pain, discomfort, or fever as directed by your health care provider.  Take only those medicines that your health care provider has approved.  Do not drink alcohol until your health care provider says you are well enough to do so. Alcohol and certain other drugs may slow your recovery and can put you at risk of further injury.  If it is harder than usual to remember things, write them down.  If you are easily distracted, try to do one thing at a time. For example, do not try to watch TV while fixing dinner.  Talk with family members or close friends when making important decisions.  Keep all follow-up appointments. Repeated evaluation of your symptoms is recommended for your recovery.  Watch your symptoms and tell others to do the same. Complications sometimes occur after a concussion. Older adults with a brain injury may have a higher risk of serious complications, such as a blood clot on the brain.  Tell your teachers, school nurse, school counselor, coach, athletic trainer, or work Freight forwarder about your injury, symptoms, and restrictions. Tell them about what you can or cannot do. They should watch for:  Increased problems with attention or concentration.  Increased difficulty remembering or learning new information.  Increased time needed to complete tasks or assignments.  Increased irritability or decreased ability to cope with stress.  Increased symptoms.  Rest. Rest helps the brain to heal. Make sure you:  Get plenty of sleep at night. Avoid staying up late at night.  Keep the same bedtime hours on weekends and weekdays.  Rest during the day. Take daytime naps or rest breaks when you feel tired.  Limit activities that require a lot of thought or concentration. These include:  Doing homework or job-related work.  Watching TV.  Working on the computer.  Avoid any situation where there is potential for  another head injury (football, hockey, soccer, basketball, martial arts, downhill snow sports and horseback riding). Your condition will get worse every time you experience a concussion. You should avoid these activities until you are evaluated by the appropriate follow-up health care providers. Returning To Your Regular Activities You will need to return to your normal activities slowly, not all at once. You must give your body and brain enough time for recovery.  Do not return to sports or other athletic activities until your health care provider tells you it is safe to do so.  Ask your health care provider when you can drive, ride a bicycle, or operate heavy machinery. Your  ability to react may be slower after a brain injury. Never do these activities if you are dizzy.  Ask your health care provider about when you can return to work or school. Preventing Another Concussion It is very important to avoid another brain injury, especially before you have recovered. In rare cases, another injury can lead to permanent brain damage, brain swelling, or death. The risk of this is greatest during the first 7-10 days after a head injury. Avoid injuries by:  Wearing a seat belt when riding in a car.  Drinking alcohol only in moderation.  Wearing a helmet when biking, skiing, skateboarding, skating, or doing similar activities.  Avoiding activities that could lead to a second concussion, such as contact or recreational sports, until your health care provider says it is okay.  Taking safety measures in your home.  Remove clutter and tripping hazards from floors and stairways.  Use grab bars in bathrooms and handrails by stairs.  Place non-slip mats on floors and in bathtubs.  Improve lighting in dim areas. SEEK MEDICAL CARE IF:  You have increased problems paying attention or concentrating.  You have increased difficulty remembering or learning new information.  You need more time to complete  tasks or assignments than before.  You have increased irritability or decreased ability to cope with stress.  You have more symptoms than before. Seek medical care if you have any of the following symptoms for more than 2 weeks after your injury:  Lasting (chronic) headaches.  Dizziness or balance problems.  Nausea.  Vision problems.  Increased sensitivity to noise or light.  Depression or mood swings.  Anxiety or irritability.  Memory problems.  Difficulty concentrating or paying attention.  Sleep problems.  Feeling tired all the time. SEEK IMMEDIATE MEDICAL CARE IF:  You have severe or worsening headaches. These may be a sign of a blood clot in the brain.  You have weakness (even if only in one hand, leg, or part of the face).  You have numbness.  You have decreased coordination.  You vomit repeatedly.  You have increased sleepiness.  One pupil is larger than the other.  You have convulsions.  You have slurred speech.  You have increased confusion. This may be a sign of a blood clot in the brain.  You have increased restlessness, agitation, or irritability.  You are unable to recognize people or places.  You have neck pain.  It is difficult to wake you up.  You have unusual behavior changes.  You lose consciousness. MAKE SURE YOU:  Understand these instructions.  Will watch your condition.  Will get help right away if you are not doing well or get worse.   This information is not intended to replace advice given to you by your health care provider. Make sure you discuss any questions you have with your health care provider.   Document Released: 07/22/2003 Document Revised: 05/22/2014 Document Reviewed: 11/21/2012 Elsevier Interactive Patient Education 2016 [place] or Splint Care Casts and splints support injured limbs and keep bones from moving while they heal. It is important to care for your cast or splint at home.  HOME  CARE INSTRUCTIONS  Keep the cast or splint uncovered during the drying period. It can take 24 to 48 hours to dry if it is made of plaster. A fiberglass cast will dry in less than 1 hour.  Do not rest the cast on anything harder than a pillow for the first 24 hours.  Do  not put weight on your injured limb or apply pressure to the cast until your health care provider gives you permission.  Keep the cast or splint dry. Wet casts or splints can lose their shape and may not support the limb as well. A wet cast that has lost its shape can also create harmful pressure on your skin when it dries. Also, wet skin can become infected.  Cover the cast or splint with a plastic bag when bathing or when out in the rain or snow. If the cast is on the trunk of the body, take sponge baths until the cast is removed.  If your cast does become wet, dry it with a towel or a blow dryer on the cool setting only.  Keep your cast or splint clean. Soiled casts may be wiped with a moistened cloth.  Do not place any hard or soft foreign objects under your cast or splint, such as cotton, toilet paper, lotion, or powder.  Do not try to scratch the skin under the cast with any object. The object could get stuck inside the cast. Also, scratching could lead to an infection. If itching is a problem, use a blow dryer on a cool setting to relieve discomfort.  Do not trim or cut your cast or remove padding from inside of it.  Exercise all joints next to the injury that are not immobilized by the cast or splint. For example, if you have a long leg cast, exercise the hip joint and toes. If you have an arm cast or splint, exercise the shoulder, elbow, thumb, and fingers.  Elevate your injured arm or leg on 1 or 2 pillows for the first 1 to 3 days to decrease swelling and pain.It is best if you can comfortably elevate your cast so it is higher than your heart. SEEK MEDICAL CARE IF:   Your cast or splint cracks.  Your cast or  splint is too tight or too loose.  You have unbearable itching inside the cast.  Your cast becomes wet or develops a soft spot or area.  You have a bad smell coming from inside your cast.  You get an object stuck under your cast.  Your skin around the cast becomes red or raw.  You have new pain or worsening pain after the cast has been applied. SEEK IMMEDIATE MEDICAL CARE IF:   You have fluid leaking through the cast.  You are unable to move your fingers or toes.  You have discolored (blue or white), cool, painful, or very swollen fingers or toes beyond the cast.  You have tingling or numbness around the injured area.  You have severe pain or pressure under the cast.  You have any difficulty with your breathing or have shortness of breath.  You have chest pain.   This information is not intended to replace advice given to you by your health care provider. Make sure you discuss any questions you have with your health care provider.   Document Released: 04/28/2000 Document Revised: 02/19/2013 Document Reviewed: 11/07/2012 Elsevier Interactive Patient Education [employer].

## 2015-07-06 ENCOUNTER — Ambulatory Visit (INDEPENDENT_AMBULATORY_CARE_PROVIDER_SITE_OTHER): Payer: BLUE CROSS/BLUE SHIELD | Admitting: Family Medicine

## 2015-07-06 ENCOUNTER — Encounter: Payer: Self-pay | Admitting: Family Medicine

## 2015-07-06 VITALS — BP 118/50 | HR 74 | Temp 97.9°F | Resp 16 | Wt 178.6 lb

## 2015-07-06 DIAGNOSIS — S060X1D Concussion with loss of consciousness of 30 minutes or less, subsequent encounter: Secondary | ICD-10-CM | POA: Diagnosis not present

## 2015-07-06 DIAGNOSIS — S52511S Displaced fracture of right radial styloid process, sequela: Secondary | ICD-10-CM

## 2015-07-06 NOTE — Progress Notes (Signed)
Subjective:     Patient ID: Billy Hardin, male   DOB: Jul 12, 1950, 65 y.o.   MRN: [id number]  HPI  Chief Complaint  Patient presents with  . Fall    Patient returns to office today for hospital follow up, he states that on Sunday,2/19, he was on ladder in his back deck and when he tried to climb down ladder gave way and patient hit deck. Wife called EMS and she later on transported patient to [facility] ER were it was determined that patient had a fracture to right wrist, sprain of right ankle and concusssion.   States he has appointment with Dr. Sabra Heck, orthopedics, on 2/23. He is in a cast today. Denies headache, visual changes, mental status changes (drove himself to his appointment) Has tried to keep his ankle elevated at home. 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   Review of Systems     Objective:   Physical Exam  Constitutional: He appears well-developed and well-nourished. No distress.  Eyes: EOM are normal. Pupils are equal, round, and reactive to light.  Musculoskeletal:  Right ankle without swelling. Ligaments stable, DF/PF 5/5.  Neurological: Coordination (Romberg negative and finger to nose WNL on left) normal.  Can recite his DOB, day of week, month and year. Can calculate backwards from 20 and recite the days of the month backwards.       Assessment:    1. Radial styloid fracture, right, sequela  2. Concussion, with loss of consciousness of 30 minutes or less, subsequent encounter    Plan:    F/u with Dr.Miller as scheduled.

## 2015-07-06 NOTE — Patient Instructions (Signed)
F/u with Dr. Sabra Heck as scheduled.

## 2015-07-26 ENCOUNTER — Other Ambulatory Visit: Payer: Self-pay | Admitting: Family Medicine

## 2015-09-08 ENCOUNTER — Other Ambulatory Visit: Payer: Self-pay | Admitting: Family Medicine

## 2015-09-09 ENCOUNTER — Other Ambulatory Visit: Payer: Self-pay | Admitting: Family Medicine

## 2015-09-09 ENCOUNTER — Encounter: Payer: Self-pay | Admitting: Family Medicine

## 2015-10-13 ENCOUNTER — Other Ambulatory Visit: Payer: Self-pay | Admitting: Family Medicine

## 2015-10-14 ENCOUNTER — Other Ambulatory Visit: Payer: Self-pay | Admitting: Family Medicine

## 2015-10-14 NOTE — Telephone Encounter (Signed)
This dose is below the usual suppressive dose of one gram daily. If the lower dose has kept his flares down I will leave it at 500 mg. If he continues to have several flares annually would increase to 1 gram.

## 2015-10-14 NOTE — Telephone Encounter (Signed)
PLease check with pharmacy-should have refill fro 60 tablets remaining

## 2015-10-15 NOTE — Telephone Encounter (Signed)
I am going to send in the recommended dose of 1 gram. For best results would need to take it daily

## 2015-10-15 NOTE — Telephone Encounter (Signed)
[id number] 

## 2015-10-15 NOTE — Telephone Encounter (Signed)
Patient states that his prescription has always been for 500mg , he states that he takes pill Monday, Wednesday and Friday every week. Patient states that last outbreak was a month ago.KW

## 2015-10-15 NOTE — Telephone Encounter (Signed)
Patient has been advised as below. KW

## 2015-10-15 NOTE — Telephone Encounter (Signed)
Spoke with pharmacist who states they never received e-script for medication in April, patient had last filled medication in March from a previous script that was written in [place] that had refills. Verbally gave order on phone for e-scrip from 4/27. KW

## 2015-10-23 ENCOUNTER — Other Ambulatory Visit: Payer: Self-pay | Admitting: Family Medicine

## 2015-11-19 ENCOUNTER — Ambulatory Visit (INDEPENDENT_AMBULATORY_CARE_PROVIDER_SITE_OTHER): Payer: BLUE CROSS/BLUE SHIELD | Admitting: Family Medicine

## 2015-11-19 ENCOUNTER — Ambulatory Visit
Admission: RE | Admit: 2015-11-19 | Discharge: 2015-11-19 | Disposition: A | Discharge status: Home / Self Care | Payer: BLUE CROSS/BLUE SHIELD | Source: Ambulatory Visit | Attending: Family Medicine | Admitting: Family Medicine

## 2015-11-19 ENCOUNTER — Encounter: Payer: Self-pay | Admitting: Family Medicine

## 2015-11-19 VITALS — BP 118/66 | HR 88 | Temp 98.5°F | Resp 16 | Wt 178.0 lb

## 2015-11-19 DIAGNOSIS — I700 Atherosclerosis of aorta: Secondary | ICD-10-CM | POA: Insufficient documentation

## 2015-11-19 DIAGNOSIS — Z87442 Personal history of urinary calculi: Secondary | ICD-10-CM

## 2015-11-19 DIAGNOSIS — Z9889 Other specified postprocedural states: Secondary | ICD-10-CM | POA: Diagnosis not present

## 2015-11-19 DIAGNOSIS — R1031 Right lower quadrant pain: Secondary | ICD-10-CM | POA: Insufficient documentation

## 2015-11-19 DIAGNOSIS — N200 Calculus of kidney: Secondary | ICD-10-CM | POA: Insufficient documentation

## 2015-11-19 LAB — POCT URINALYSIS DIPSTICK
Blood, UA: NEGATIVE
GLUCOSE UA: NEGATIVE
KETONES UA: NEGATIVE
LEUKOCYTES UA: NEGATIVE
Nitrite, UA: NEGATIVE
Urobilinogen, UA: 0.2
pH, UA: 6

## 2015-11-19 MED ORDER — HYDROCODONE-ACETAMINOPHEN 5-325 MG PO TABS: ORAL_TABLET | ORAL | Status: DC

## 2015-11-19 NOTE — Patient Instructions (Signed)
Continue to push fluids. Use pain medication as needed. We will call you with the scan results.

## 2015-11-19 NOTE — Progress Notes (Signed)
Subjective:     Patient ID: Billy Hardin, male   DOB: [birth year], 65 y.o.   MRN: [id number]  HPI  Chief Complaint  Patient presents with  . Abdominal Pain    Right side. Intermittent x 1 week. Pt states the pain feels like "somebody kicked me". Is worsening. Can get up to a 7/10. Denies fever, N/V/D, constipation, bloody urine/stools. Also denies dysuria.  States pain has lasted anywhere from 5 minutes to 2-3 hours then will spontaneously abate. Reports today pain was severe enough to make him light headed and double over. Last moved bowels last night.States his sugars have been running in the 140-160's. Reports he has had mild cold symptoms in the last week. Hx of kidney stones ([facility]: 06/09/14 with cystoscopy/right ureteroscopy/stent with removal of two stones) Colonoscopy in 2015 without diverticulosis.   Review of Systems  Constitutional: Negative for fever and chills.       Objective:   Physical Exam  Constitutional: He appears well-developed and well-nourished. No distress.  Pulmonary/Chest: Breath sounds normal.  Abdominal: Bowel sounds are normal. There is tenderness (mild in right lower quadrant). There is no guarding.       Assessment:    1. Right lower quadrant abdominal pain - POCT urinalysis dipstick - HYDROcodone-acetaminophen (NORCO/VICODIN) 5-325 MG tablet; One every 4-6 hours as needed for pain  Dispense: 28 tablet; Refill: 0 - CT RENAL STONE STUDY; Future  2. History of kidney stones - CT RENAL STONE STUDY; Future    Plan:    Further f/u pending scan results. Urged increased fluid intake.

## 2015-11-22 ENCOUNTER — Other Ambulatory Visit: Payer: Self-pay | Admitting: Family Medicine

## 2015-11-25 ENCOUNTER — Ambulatory Visit (INDEPENDENT_AMBULATORY_CARE_PROVIDER_SITE_OTHER): Payer: BLUE CROSS/BLUE SHIELD | Admitting: Family Medicine

## 2015-11-25 ENCOUNTER — Encounter: Payer: Self-pay | Admitting: Family Medicine

## 2015-11-25 VITALS — BP 120/62 | HR 72 | Temp 98.0°F | Resp 16 | Wt 175.0 lb

## 2015-11-25 DIAGNOSIS — E119 Type 2 diabetes mellitus without complications: Secondary | ICD-10-CM | POA: Diagnosis not present

## 2015-11-25 DIAGNOSIS — R1031 Right lower quadrant pain: Secondary | ICD-10-CM | POA: Diagnosis not present

## 2015-11-25 LAB — POCT GLYCOSYLATED HEMOGLOBIN (HGB A1C)
ESTIMATED AVERAGE GLUCOSE: 154
HEMOGLOBIN A1C: 7

## 2015-11-25 MED ORDER — METFORMIN HCL 1000 MG PO TABS: 1000.0000 mg | ORAL_TABLET | Freq: Two times a day (BID) | ORAL | Status: DC

## 2015-11-25 MED ORDER — DICYCLOMINE HCL 20 MG PO TABS: 20.0000 mg | ORAL_TABLET | Freq: Four times a day (QID) | ORAL | Status: DC

## 2015-11-25 NOTE — Patient Instructions (Signed)
You may have some loose stools at first with the higher dose of metformin. This should improve after a few days. If not go back to the lower dose and call me.

## 2015-11-25 NOTE — Progress Notes (Signed)
Subjective:     Patient ID: Billy Hardin, male   DOB: 04-Jan-1951, 65 y.o.   MRN: [id number]  HPI  Chief Complaint  Patient presents with  . Abdominal Pain    1 week FU. Improving with intermittent mild pains.  . Diabetes    Last A1C 05/26/2015 and was 6.6%. Pt reports his FBS this morning was 153. 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 Prior CT scan was normal with stable left sided kidney stone noted.  Review of Systems  Respiratory: Negative for shortness of breath.   Cardiovascular: Negative for chest pain and palpitations.       Objective:   Physical Exam  Constitutional: He appears well-developed and well-nourished. No distress.  Cardiovascular: Normal rate and regular rhythm.   Pulmonary/Chest: Breath sounds normal.  Musculoskeletal: He exhibits no edema (of lower extremities).       Assessment:    1. Type 2 diabetes mellitus without complication, without long-term current use of insulin (HCC) - POCT glycosylated hemoglobin (Hb A1C) - metFORMIN (GLUCOPHAGE) 1000 MG tablet; Take 1 tablet (1,000 mg total) by mouth 2 (two) times daily with a meal.  Dispense: 180 tablet; Refill: 3  2. Right lower quadrant abdominal pain - dicyclomine (BENTYL) 20 MG tablet; Take 1 tablet (20 mg total) by mouth every 6 (six) hours. As needed for abdominal pain  Dispense: 28 tablet; Refill: 0    Plan:    Let me know if not tolerating higher dose of metformin. F/u with counselor and try Bentyl as needed for abdominal pain.

## 2016-01-05 ENCOUNTER — Telehealth: Payer: Self-pay | Admitting: Family Medicine

## 2016-01-05 NOTE — Telephone Encounter (Signed)
Billy Hardin stated pt is seeing her for counseling and she has notice some improvement and she would like Billy Hardin to return her call to discuss possible need for antidepressant medication. Billy Hardin was advised that Billy Hardin will be out of the office this afternoon.Thanks TNP

## 2016-01-05 NOTE — Telephone Encounter (Signed)
Please review. KW 

## 2016-01-06 NOTE — Telephone Encounter (Signed)
Billy Hardin states [name] is doing well with cognitive behavior therapy. Will recontact me in 2 weeks if patient wishes to try Effexor or similar.

## 2016-01-18 ENCOUNTER — Other Ambulatory Visit: Payer: Self-pay | Admitting: Family Medicine

## 2016-01-19 NOTE — Telephone Encounter (Signed)
See which of these lancets he is using. Doesn't make sense to send both types.

## 2016-01-20 NOTE — Telephone Encounter (Signed)
Pt is returning call.  CB#[phone]

## 2016-01-20 NOTE — Telephone Encounter (Addendum)
Patient will call pharmacy to see which lancet RX is correct.

## 2016-01-20 NOTE — Telephone Encounter (Signed)
lmtcb Clessie Karras Drozdowski, CMA  

## 2016-01-21 NOTE — Telephone Encounter (Signed)
Pt reports it is his Delica lancets that he needs refilled. Renaldo Fiddler, CMA

## 2016-02-05 ENCOUNTER — Encounter: Payer: Self-pay | Admitting: Family Medicine

## 2016-02-08 ENCOUNTER — Telehealth: Payer: Self-pay

## 2016-02-08 NOTE — Telephone Encounter (Signed)
Patient had sent email to office with concerns of billing matter. When I called and spoke with patient he states that billing issue is being handled and has been contacted back by department. KW

## 2016-02-11 ENCOUNTER — Other Ambulatory Visit: Payer: Self-pay | Admitting: Family Medicine

## 2016-02-28 ENCOUNTER — Encounter: Payer: Self-pay | Admitting: Family Medicine

## 2016-02-28 ENCOUNTER — Ambulatory Visit (INDEPENDENT_AMBULATORY_CARE_PROVIDER_SITE_OTHER): Payer: BLUE CROSS/BLUE SHIELD | Admitting: Family Medicine

## 2016-02-28 VITALS — BP 110/60 | HR 76 | Temp 97.8°F | Resp 16 | Wt 175.2 lb

## 2016-02-28 DIAGNOSIS — F439 Reaction to severe stress, unspecified: Secondary | ICD-10-CM | POA: Diagnosis not present

## 2016-02-28 DIAGNOSIS — F32A Depression, unspecified: Secondary | ICD-10-CM

## 2016-02-28 DIAGNOSIS — F329 Major depressive disorder, single episode, unspecified: Secondary | ICD-10-CM | POA: Diagnosis not present

## 2016-02-28 MED ORDER — SERTRALINE HCL 50 MG PO TABS: 50.0000 mg | ORAL_TABLET | Freq: Every day | ORAL | 0 refills | Status: DC

## 2016-02-28 NOTE — Patient Instructions (Signed)
Start at 1/2 pill daily for the first 5 days then a whole pill daily. May continue clonazepam as needed.

## 2016-02-28 NOTE — Progress Notes (Signed)
stress

## 2016-02-28 NOTE — Progress Notes (Signed)
Subjective:     Patient ID: Billy Hardin, male   DOB: 06/06/1950, 65 y.o.   MRN: [id number]  HPI Chief Complaint  Patient presents with  . Stress    Patient comes in office today to address symptom of stress for the past several months. Patient states that he sees a therapist and was advised to speack to his PCP in regards to medication management.  States he has had 5 sessions with Miguel Dibble who has given him perspective regarding his work place stress. Currently use of clonazepam is now at bedtime and 1/2 during the day if he is stressed. States it "slows" him down and he does not like to take it regularly at work. Now reports daily headache,chest discomfort/tightness, decreased appetite and diminished energy. States he may be somewhat depressed and has lost interest in his hobby of making canes. We have had him on sertraline in the past but discontinued due to concerns about sex life. Now has a pump for erections. Willing to try sertraline again. States his wife has also had a URI.  Review of Systems     Objective:   Physical Exam  Constitutional: He appears well-developed and well-nourished. No distress.  Ears: T.M's intact without inflammation Throat: tonsils absent Neck: no cervical adenopathy Lungs: clear     Assessment:    1. Situational stres - sertraline (ZOLOFT) 50 MG tablet; Take 1 tablet (50 mg total) by mouth daily. Start at 1/2 pill daily  Dispense: 30 tablet; Refill: 0  2. Depression, unspecified depression type - sertraline (ZOLOFT) 50 MG tablet; Take 1 tablet (50 mg total) by mouth daily. Start at 1/2 pill daily  Dispense: 30 tablet; Refill: 0    Plan:    Discussed continued use of clonazepam and counseling sessions.

## 2016-03-13 ENCOUNTER — Encounter: Payer: Self-pay | Admitting: Family Medicine

## 2016-03-13 ENCOUNTER — Ambulatory Visit (INDEPENDENT_AMBULATORY_CARE_PROVIDER_SITE_OTHER): Payer: BLUE CROSS/BLUE SHIELD | Admitting: Family Medicine

## 2016-03-13 VITALS — BP 116/62 | HR 66 | Temp 97.7°F | Resp 16 | Wt 174.0 lb

## 2016-03-13 DIAGNOSIS — F439 Reaction to severe stress, unspecified: Secondary | ICD-10-CM

## 2016-03-13 DIAGNOSIS — F329 Major depressive disorder, single episode, unspecified: Secondary | ICD-10-CM | POA: Diagnosis not present

## 2016-03-13 DIAGNOSIS — F32A Depression, unspecified: Secondary | ICD-10-CM

## 2016-03-13 MED ORDER — VENLAFAXINE HCL ER 37.5 MG PO CP24: 37.5000 mg | ORAL_CAPSULE | Freq: Every day | ORAL | 0 refills | Status: DC

## 2016-03-13 NOTE — Progress Notes (Addendum)
Subjective:     Patient ID: Billy Hardin, male   DOB: Aug 08, 1950, 65 y.o.   MRN: [id number]  HPI  Chief Complaint  Patient presents with  . Depression    Patient returns to office for two week follow up, patient was last seen in office on 02/28/16 and started on Zoloft 50mg . Patient reports since starting medication his symptoms are much worse, he still is taking Clonazepam PRN.   States he was unable to ejaculate on sertraline. Continues to have early awakening and has difficulties getting back to sleep. We have tried Bupropion in the past but also described sexual dysfunction with that as well in addition to his issues with insomnia.   Review of Systems     Objective:   Physical Exam  Constitutional: He appears well-developed and well-nourished. No distress.  Psychiatric:  Depressed affect. Not emotionally labile.       Assessment:    1. Situational stress: continue clonazepam - venlafaxine XR (EFFEXOR-XR) 37.5 MG 24 hr capsule; Take 1 capsule (37.5 mg total) by mouth daily with breakfast. May increase to two pills daily if tolerating ok.  Dispense: 30 capsule; Refill: 0  2. Depression, unspecified depression type - venlafaxine XR (EFFEXOR-XR) 37.5 MG 24 hr capsule; Take 1 capsule (37.5 mg total) by mouth daily with breakfast. May increase to two pills daily if tolerating ok.  Dispense: 30 capsule; Refill: 0    Plan:    Will try SNRI with phone f/u in 2-3 weeks. Consider psychiatry referral.

## 2016-03-13 NOTE — Patient Instructions (Signed)
May increase to two pills daily if tolerating after a week. Phone f/u in 2-3 weeks.

## 2016-03-28 ENCOUNTER — Telehealth: Payer: Self-pay | Admitting: Family Medicine

## 2016-03-28 ENCOUNTER — Other Ambulatory Visit: Payer: Self-pay | Admitting: Family Medicine

## 2016-03-28 DIAGNOSIS — F32A Depression, unspecified: Secondary | ICD-10-CM

## 2016-03-28 DIAGNOSIS — F329 Major depressive disorder, single episode, unspecified: Secondary | ICD-10-CM

## 2016-03-28 MED ORDER — MIRTAZAPINE 15 MG PO TABS: 15.0000 mg | ORAL_TABLET | Freq: Every day | ORAL | 0 refills | Status: DC

## 2016-03-28 NOTE — Telephone Encounter (Signed)
Pt called saying the last medication he was given for depression (venlafaxine)? is not working.  Pt has stopped taking it.  Pt also stated he is having side effects of ED.  Pt call back is [phone]  Thanks Con Memos

## 2016-03-28 NOTE — Telephone Encounter (Signed)
Pt does NOT agree to referral. Please advise. Renaldo Fiddler, CMA

## 2016-03-28 NOTE — Telephone Encounter (Signed)
I would like for him to see a psychiatrist as they may have more ideas on how to treat his depression. Does he wish referral.

## 2016-03-28 NOTE — Telephone Encounter (Signed)
Stop the venlafaxine. I have sent in mirtazapine to use one at bedtime as it will help him sleep. Phone f/u in two weeks.

## 2016-03-28 NOTE — Telephone Encounter (Signed)
Please Review.  Thanks,  -Joseline 

## 2016-03-29 NOTE — Telephone Encounter (Signed)
Patient has been advised. KW 

## 2016-04-17 ENCOUNTER — Other Ambulatory Visit: Payer: Self-pay | Admitting: Family Medicine

## 2016-04-17 ENCOUNTER — Telehealth: Payer: Self-pay | Admitting: Family Medicine

## 2016-04-17 DIAGNOSIS — F329 Major depressive disorder, single episode, unspecified: Secondary | ICD-10-CM

## 2016-04-17 DIAGNOSIS — F32A Depression, unspecified: Secondary | ICD-10-CM

## 2016-04-17 MED ORDER — MIRTAZAPINE 30 MG PO TABS: 30.0000 mg | ORAL_TABLET | Freq: Every day | ORAL | 1 refills | Status: DC

## 2016-04-17 NOTE — Telephone Encounter (Signed)
Discussed-will increase mirtazapine to 30 mg. Phone f/u in 2-3 weeks.

## 2016-04-17 NOTE — Telephone Encounter (Signed)
Pt is calling to report back on the medication he was prescribed for sleeplessness.  He says it worked at first but now having some problems again   clonazePAM (KLONOPIN) 0.5 MG tablet  He wandersi fhe needs to up the dose.  His call back is [phone]  Thanks teri

## 2016-04-18 ENCOUNTER — Other Ambulatory Visit: Payer: Self-pay | Admitting: Family Medicine

## 2016-04-18 DIAGNOSIS — F439 Reaction to severe stress, unspecified: Secondary | ICD-10-CM

## 2016-04-18 DIAGNOSIS — F32A Depression, unspecified: Secondary | ICD-10-CM

## 2016-04-18 DIAGNOSIS — F329 Major depressive disorder, single episode, unspecified: Secondary | ICD-10-CM

## 2016-04-19 ENCOUNTER — Other Ambulatory Visit: Payer: Self-pay | Admitting: Family Medicine

## 2016-05-17 ENCOUNTER — Other Ambulatory Visit: Payer: Self-pay | Admitting: Family Medicine

## 2016-05-30 ENCOUNTER — Encounter: Payer: Self-pay | Admitting: Family Medicine

## 2016-05-30 ENCOUNTER — Ambulatory Visit (INDEPENDENT_AMBULATORY_CARE_PROVIDER_SITE_OTHER): Payer: BLUE CROSS/BLUE SHIELD | Admitting: Family Medicine

## 2016-05-30 VITALS — BP 122/62 | HR 85 | Temp 97.7°F | Resp 16 | Ht 72.0 in | Wt 180.4 lb

## 2016-05-30 DIAGNOSIS — Z23 Encounter for immunization: Secondary | ICD-10-CM | POA: Diagnosis not present

## 2016-05-30 DIAGNOSIS — F439 Reaction to severe stress, unspecified: Secondary | ICD-10-CM | POA: Diagnosis not present

## 2016-05-30 DIAGNOSIS — L309 Dermatitis, unspecified: Secondary | ICD-10-CM

## 2016-05-30 DIAGNOSIS — G43709 Chronic migraine without aura, not intractable, without status migrainosus: Secondary | ICD-10-CM

## 2016-05-30 DIAGNOSIS — Z0000 Encounter for general adult medical examination without abnormal findings: Secondary | ICD-10-CM

## 2016-05-30 DIAGNOSIS — E119 Type 2 diabetes mellitus without complications: Secondary | ICD-10-CM

## 2016-05-30 DIAGNOSIS — F329 Major depressive disorder, single episode, unspecified: Secondary | ICD-10-CM | POA: Diagnosis not present

## 2016-05-30 DIAGNOSIS — F32A Depression, unspecified: Secondary | ICD-10-CM | POA: Insufficient documentation

## 2016-05-30 HISTORY — DX: Chronic migraine without aura, not intractable, without status migrainosus: G43.709

## 2016-05-30 LAB — POCT GLYCOSYLATED HEMOGLOBIN (HGB A1C)

## 2016-05-30 MED ORDER — TRIAMCINOLONE ACETONIDE 0.1 % EX CREA: TOPICAL_CREAM | CUTANEOUS | 0 refills | Status: DC

## 2016-05-30 NOTE — Patient Instructions (Signed)
We will call you with the lab work. If lab work is ok we will try a daily medication for headaches. I will fill out your health form after you drop it off.

## 2016-05-30 NOTE — Progress Notes (Signed)
Subjective:     Patient ID: Billy Hardin, male   DOB: 1950-10-14, 66 y.o.   MRN: [id number]  HPI  Chief Complaint  Patient presents with  . Annual Exam    Patient comes in office today for his annual physical, patient reports that he works a full time job and is staying active by with work duties. Patient reports that he averages 7hrs of sleep at night and is working on maintaining a well balance diet. Patient states that wife had concerns of frequent headaches that he has, patient reports 2-3x a week he has been taking otc Tylenol Migraine. Patient last reported Tdap 01/16/07, Pneumo 23- 05/26/15, Flu 02/2016, Shingles 06/04/12, Colonoscopy 08/07/13.   . Diabetes    Patient returns for 6 month follow up, last office visit was 11/24/2005 HgbA1C at visit was 7.0%. Patient reports good compliance, tolerance and symptom control on Metformin 1000mg   States he will get frontal pressure to throbbing headaches at the end of his workday 2-3 x week. He will take Tylenol MIgraine and lay down with the headache usually abating in an hour. Remains in a stressful work environment. Last eye exam 08/2015. Uses clonazepam and Remeron in the evenings. States he keeps a clonazepam with him during the day but does not like to take as it "slows him down."  Review of Systems General: Feeling well, Due for Prevnar 13. HEENT: regular dental visits and eye exams. Wears a left hearing aid. Cardiovascular: no chest pain, shortness of breath, or palpitations GI: no heartburn, no change in bowel habits or blood in the stool GU: nocturia x 0, no change in bladder habits. Has a penile prosthesis Psychiatric: not depressed-controlled with medication. Musculoskeletal: Uses a left knee wrap and takes Vitamin E for knee pain c/w osteoarthritis which works for him. Right wrist continues to pain him after rx for fracture approximately one year ago. Discussed returning to orthopedics. Skin: mildly pruritic scaly rash above his left  posterior ankle.    Objective:   Physical Exam  Constitutional: He appears well-developed and well-nourished. No distress.  Eyes: PERRLA, EOMI Neck: no thyromegaly, tenderness or nodules, no carotid bruits, or cervical adenopathy ENT: TM's intact without inflammation; No tonsillar enlargement or exudate, Lungs: Clear Heart : RRR without murmur or gallop Abd: bowel sounds present, soft, non-tender, no organomegaly Rectal: Prostate firm and non-tender Extremities: no edema; left knee with FROM with crepitus but no erythema or effusion noted. Skin: 2-3 oval patches of scaly erythematous rash left posterior leg above his ankle. No cetral clearing or raised borders.     Assessment:    1. Type 2 diabetes mellitus without complication, without long-term current use of insulin (HCC) - POCT glycosylated hemoglobin (Hb A1C) - Microalbumin / creatinine urine ratio - Comprehensive metabolic panel  2. Need for pneumococcal vaccination - Pneumococcal conjugate vaccine 13-valent IM  3. Annual physical exam - Lipid panel - PSA  4. Situational stress: continue clonazepam  5. Depression, unspecified depression type: continue Remeron   6. Eczema, unspecified type - triamcinolone cream (KENALOG) 0.1 %; Apply 2-3 x day to leg rash  Dispense: 30 g; Refill: 0  7. Chronic migraine without aura without status migrainosus, not intractable: consider beta blocker prophylaxis if labs ok    Plan:    Further f/u pending lab work. Will fill out biometric form as provided by the patient.

## 2016-05-30 NOTE — Addendum Note (Signed)
Addended by: Quay Burow on: 05/30/2016 01:56 PM   Modules accepted: Orders

## 2016-05-31 LAB — LIPID PANEL

## 2016-05-31 LAB — PSA

## 2016-05-31 LAB — COMPREHENSIVE METABOLIC PANEL

## 2016-05-31 LAB — MICROALBUMIN / CREATININE URINE RATIO

## 2016-06-02 ENCOUNTER — Telehealth: Payer: Self-pay

## 2016-06-02 NOTE — Telephone Encounter (Signed)
-----   Message from Carmon Ginsberg, [place] sent at 06/01/2016  6:34 AM EST ----- Labs ok ---prostate blood test still pending-we will call when that lab result is available

## 2016-06-02 NOTE — Telephone Encounter (Signed)
Patient was advised of lab report. KW 

## 2016-06-08 LAB — LIPID PANEL
CHOLESTEROL TOTAL: 124 mg/dL (ref 100–199)
Chol/HDL Ratio: 2.2 ratio units (ref 0.0–5.0)
HDL: 56 mg/dL (ref >39)
LDL Calculated: 52 mg/dL (ref 0–99)
TRIGLYCERIDES: 82 mg/dL (ref 0–149)
VLDL Cholesterol Cal: 16 mg/dL (ref 5–40)

## 2016-06-08 LAB — COMPREHENSIVE METABOLIC PANEL
A/G RATIO: 2.3 — AB (ref 1.2–2.2)
ALK PHOS: 54 IU/L (ref 39–117)
ALT: 16 IU/L (ref 0–44)
AST: 17 IU/L (ref 0–40)
Albumin: 4.5 g/dL (ref 3.6–4.8)
BILIRUBIN TOTAL: 0.3 mg/dL (ref 0.0–1.2)
BUN/Creatinine Ratio: 15 (ref 10–24)
BUN: 16 mg/dL (ref 8–27)
CHLORIDE: 108 mmol/L — AB (ref 96–106)
CO2: 16 mmol/L — ABNORMAL LOW (ref 18–29)
Calcium: 9.3 mg/dL (ref 8.6–10.2)
Creatinine, Ser: 1.04 mg/dL (ref 0.76–1.27)
GFR calc Af Amer: 87 mL/min/{1.73_m2} (ref >59)
GFR calc non Af Amer: 75 mL/min/{1.73_m2} (ref >59)
GLUCOSE: 119 mg/dL — AB (ref 65–99)
Globulin, Total: 2 g/dL (ref 1.5–4.5)
POTASSIUM: 4.7 mmol/L (ref 3.5–5.2)
Sodium: 146 mmol/L — ABNORMAL HIGH (ref 134–144)
Total Protein: 6.5 g/dL (ref 6.0–8.5)

## 2016-06-08 LAB — MICROALBUMIN / CREATININE URINE RATIO
Creatinine, Urine: 96.1 mg/dL
Microalb/Creat Ratio: <3.1 mg/g creat (ref 0.0–30.0)
Microalbumin, Urine: <3 ug/mL

## 2016-06-08 LAB — PSA

## 2016-06-10 ENCOUNTER — Other Ambulatory Visit: Payer: Self-pay | Admitting: Family Medicine

## 2016-06-15 ENCOUNTER — Other Ambulatory Visit: Payer: Self-pay | Admitting: Family Medicine

## 2016-06-15 DIAGNOSIS — F32A Depression, unspecified: Secondary | ICD-10-CM

## 2016-06-15 DIAGNOSIS — F329 Major depressive disorder, single episode, unspecified: Secondary | ICD-10-CM

## 2016-07-15 ENCOUNTER — Other Ambulatory Visit: Payer: Self-pay | Admitting: Family Medicine

## 2016-09-28 LAB — HM COLONOSCOPY

## 2016-10-03 ENCOUNTER — Other Ambulatory Visit: Payer: Self-pay | Admitting: Family Medicine

## 2016-10-03 DIAGNOSIS — Z125 Encounter for screening for malignant neoplasm of prostate: Secondary | ICD-10-CM

## 2016-10-04 ENCOUNTER — Telehealth: Payer: Self-pay

## 2016-10-04 ENCOUNTER — Other Ambulatory Visit: Payer: Self-pay | Admitting: Family Medicine

## 2016-10-04 LAB — PSA: PROSTATE SPECIFIC AG, SERUM: 1 ng/mL (ref 0.0–4.0)

## 2016-10-04 MED ORDER — METOPROLOL SUCCINATE ER 25 MG PO TB24: 25.0000 mg | ORAL_TABLET | Freq: Every day | ORAL | 0 refills | Status: DC

## 2016-10-04 NOTE — Telephone Encounter (Signed)
LMTCB

## 2016-10-04 NOTE — Telephone Encounter (Signed)
Patient advised as below. Patient verbalizes understanding and is in agreement with treatment plan. Patient will call back to schedule appointment. sd

## 2016-10-04 NOTE — Telephone Encounter (Signed)
-----   Message from Carmon Ginsberg, [place] sent at 10/04/2016  7:32 AM EDT ----- psa normal

## 2016-10-04 NOTE — Telephone Encounter (Signed)
Will send in a daily prevention medication at low dose. It is also a bp medication but should not lower his blood pressure much at this dose. Would want him to follow up with me in 4 weeks about his headaches.

## 2016-10-04 NOTE — Telephone Encounter (Signed)
Patient advised as below. Patient wants to know if you will prescribe medications for headaches.

## 2016-10-05 ENCOUNTER — Other Ambulatory Visit: Payer: Self-pay | Admitting: Family Medicine

## 2016-10-05 NOTE — Telephone Encounter (Signed)
Prescription for Clonazepam has been called in. Amparo [place]

## 2016-10-18 ENCOUNTER — Other Ambulatory Visit: Payer: Self-pay | Admitting: Family Medicine

## 2016-10-28 ENCOUNTER — Other Ambulatory Visit: Payer: Self-pay | Admitting: Family Medicine

## 2016-11-06 ENCOUNTER — Ambulatory Visit (INDEPENDENT_AMBULATORY_CARE_PROVIDER_SITE_OTHER): Payer: BLUE CROSS/BLUE SHIELD | Admitting: Family Medicine

## 2016-11-06 ENCOUNTER — Other Ambulatory Visit: Payer: Self-pay | Admitting: Family Medicine

## 2016-11-06 ENCOUNTER — Encounter: Payer: Self-pay | Admitting: Family Medicine

## 2016-11-06 VITALS — BP 130/74 | HR 70 | Temp 98.0°F | Resp 16 | Wt 183.0 lb

## 2016-11-06 DIAGNOSIS — E119 Type 2 diabetes mellitus without complications: Secondary | ICD-10-CM | POA: Diagnosis not present

## 2016-11-06 DIAGNOSIS — R6889 Other general symptoms and signs: Secondary | ICD-10-CM

## 2016-11-06 DIAGNOSIS — F439 Reaction to severe stress, unspecified: Secondary | ICD-10-CM

## 2016-11-06 DIAGNOSIS — G43709 Chronic migraine without aura, not intractable, without status migrainosus: Secondary | ICD-10-CM

## 2016-11-06 LAB — POCT GLYCOSYLATED HEMOGLOBIN (HGB A1C): Hemoglobin A1C: 6.7

## 2016-11-06 NOTE — Progress Notes (Signed)
Subjective:     Patient ID: Billy Hardin, male   DOB: 10-14-1950, 66 y.o.   MRN: [id number]  HPI  Chief Complaint  Patient presents with  . Diabetes    Patient returns to office today for 6 month follow up, last office visit was 05/30/16. At last office visit patients HgbA1C was 6.5%, patient reports that he has been following a well balanced diet and working on staying active. Patient reports that blood sugar readings at home have randinged from 140-150, patient has noticed changes to his foot and states for the past month he has had numbness in his toes on the left foot. Patient reports good compliance and tolerance on medication.   . Stress    Patient returns for 6 month follow up from 05/30/16.Patient reports that he continues taking Clonazepam and stress levels are still the same. Patient reports good compliance and tolerance on medication.   . Migraine    Patient returns for follow up, he was started on Metoprolol XL on 10/30/16 and reports good compliance and tolerance on medication. Patient reports that he has had 4 episodes a week of migraine headaches.   . Chills    Patient would like to discuss getting labs drawn today to check his thyroid level. Patient complains of feeling fatigued and having body chills often.   States he is not feeling well due to tooth pain and is following up with his dentist. Continues to have right wrist pain despite nocturnal use of his splint. Encouraged further f/u with orthopedics. States he had upper and lower endoscopy in May and was placed on medication for "ulcers". We will. acquire results from [facility] Reports frequency of migraine headaches has decreased from 3-4/week to 4-5/month and wishes to stay on current dose of medication.Reports AM fasting sugars in the 140-150 range. States he gets numbness on the bottoms of his left 3-5 toes.   Review of Systems  Respiratory: Negative for shortness of breath.   Cardiovascular: Negative for chest pain and  palpitations.       Objective:   Physical Exam  Constitutional: He appears well-developed and well-nourished. No distress.  Psychiatric:  Sad affect  Lungs: clear Heart: RRR without murmur Lower extremities: no edema; pedal pulses intact, sensation to monofilament intact, no wounds noted.     Assessment:    1. Type 2 diabetes mellitus without complication, without long-term current use of insulin (HCC) - POCT glycosylated hemoglobin (Hb A1C)  2. Chronic migraine without aura without status migrainosus, not intractable: continue metoprolol  3. Situational stress: continue clonazepam and mirtazapine.  4. Sensation of feeling cold - T4, free - TSH    Plan:    Further f/u pending labs. Encouraged eye exam and will review endoscopy records when available.

## 2016-11-06 NOTE — Patient Instructions (Signed)
We will call you with the lab slip. Please follow up with the dentist, eye doctor for diabetic eye exam, and the orthopedic doctor regarding your wrist.

## 2016-11-07 LAB — T4, FREE: Free T4: 1.1 ng/dL (ref 0.82–1.77)

## 2016-11-07 LAB — TSH: TSH: 1.89 u[IU]/mL (ref 0.450–4.500)

## 2016-11-08 ENCOUNTER — Encounter: Payer: Self-pay | Admitting: Family Medicine

## 2016-11-20 ENCOUNTER — Other Ambulatory Visit: Payer: Self-pay | Admitting: Family Medicine

## 2016-11-20 DIAGNOSIS — E119 Type 2 diabetes mellitus without complications: Secondary | ICD-10-CM

## 2016-12-03 ENCOUNTER — Other Ambulatory Visit: Payer: Self-pay | Admitting: Family Medicine

## 2016-12-04 ENCOUNTER — Other Ambulatory Visit: Payer: Self-pay | Admitting: Family Medicine

## 2016-12-14 ENCOUNTER — Encounter: Payer: Self-pay | Admitting: Family Medicine

## 2016-12-20 ENCOUNTER — Other Ambulatory Visit: Payer: Self-pay | Admitting: Family Medicine

## 2016-12-20 DIAGNOSIS — F32A Depression, unspecified: Secondary | ICD-10-CM

## 2016-12-20 DIAGNOSIS — F329 Major depressive disorder, single episode, unspecified: Secondary | ICD-10-CM

## 2017-01-04 ENCOUNTER — Other Ambulatory Visit: Payer: Self-pay | Admitting: Family Medicine

## 2017-02-06 ENCOUNTER — Ambulatory Visit (INDEPENDENT_AMBULATORY_CARE_PROVIDER_SITE_OTHER): Payer: BLUE CROSS/BLUE SHIELD | Admitting: Family Medicine

## 2017-02-06 ENCOUNTER — Encounter: Payer: Self-pay | Admitting: Family Medicine

## 2017-02-06 VITALS — BP 130/72 | HR 72 | Temp 97.9°F | Resp 16 | Wt 187.6 lb

## 2017-02-06 DIAGNOSIS — R0789 Other chest pain: Secondary | ICD-10-CM

## 2017-02-06 DIAGNOSIS — E119 Type 2 diabetes mellitus without complications: Secondary | ICD-10-CM | POA: Diagnosis not present

## 2017-02-06 DIAGNOSIS — R5383 Other fatigue: Secondary | ICD-10-CM | POA: Diagnosis not present

## 2017-02-06 DIAGNOSIS — F3289 Other specified depressive episodes: Secondary | ICD-10-CM

## 2017-02-06 LAB — CBC WITH DIFFERENTIAL/PLATELET
BASOS ABS: 49 {cells}/uL (ref 0–200)
Basophils Relative: 0.9 %
EOS PCT: 7.1 %
Eosinophils Absolute: 383 cells/uL (ref 15–500)
HEMATOCRIT: 37.9 % — AB (ref 38.5–50.0)
HEMOGLOBIN: 12.9 g/dL — AB (ref 13.2–17.1)
LYMPHS ABS: 1658 {cells}/uL (ref 850–3900)
MCH: 31.3 pg (ref 27.0–33.0)
MCHC: 34 g/dL (ref 32.0–36.0)
MCV: 92 fL (ref 80.0–100.0)
MPV: 10.7 fL (ref 7.5–12.5)
Monocytes Relative: 8.2 %
NEUTROS ABS: 2867 {cells}/uL (ref 1500–7800)
Neutrophils Relative %: 53.1 %
Platelets: 203 10*3/uL (ref 140–400)
RBC: 4.12 10*6/uL — AB (ref 4.20–5.80)
RDW: 13.1 % (ref 11.0–15.0)
Total Lymphocyte: 30.7 %
WBC: 5.4 10*3/uL (ref 3.8–10.8)
WBCMIX: 443 {cells}/uL (ref 200–950)

## 2017-02-06 LAB — COMPLETE METABOLIC PANEL WITH GFR
AG Ratio: 2.2 (calc) (ref 1.0–2.5)
ALBUMIN MSPROF: 4.1 g/dL (ref 3.6–5.1)
ALT: 12 U/L (ref 9–46)
AST: 12 U/L (ref 10–35)
Alkaline phosphatase (APISO): 54 U/L (ref 40–115)
BILIRUBIN TOTAL: 0.5 mg/dL (ref 0.2–1.2)
BUN: 12 mg/dL (ref 7–25)
CALCIUM: 8.8 mg/dL (ref 8.6–10.3)
CHLORIDE: 108 mmol/L (ref 98–110)
CO2: 26 mmol/L (ref 20–32)
CREATININE: 0.95 mg/dL (ref 0.70–1.25)
GFR, EST AFRICAN AMERICAN: 97 mL/min/{1.73_m2} (ref >=60)
GFR, EST NON AFRICAN AMERICAN: 84 mL/min/{1.73_m2} (ref >=60)
Globulin: 1.9 g/dL (calc) (ref 1.9–3.7)
Glucose, Bld: 140 mg/dL — ABNORMAL HIGH (ref 65–99)
Potassium: 4.1 mmol/L (ref 3.5–5.3)
Sodium: 141 mmol/L (ref 135–146)
TOTAL PROTEIN: 6 g/dL — AB (ref 6.1–8.1)

## 2017-02-06 LAB — POCT GLYCOSYLATED HEMOGLOBIN (HGB A1C): HEMOGLOBIN A1C: 6.9

## 2017-02-06 NOTE — Patient Instructions (Addendum)
We will call you with the lab results and the cardiology referral. Please return the sleep apnea screen when completed.

## 2017-02-06 NOTE — Progress Notes (Signed)
Subjective:     Patient ID: Billy Hardin, male   DOB: Feb 15, 1951, 66 y.o.   MRN: [id number]  HPI  Chief Complaint  Patient presents with  . Diabetes    Patient returns to office today for follow up visit from 11/06/16, patients HgbA1C at last visit was 6.7%. Patient reports that fasting blood sugars at home are between 130-160. Patient reports good compliace, tolerance and symptom control.  . Fatigue    Patient reports for the past month of more symptoms of decreased appetite and energy.  Remains depressed per PHQ-9. Reports compliance with Remeron. Also reports occasional episodes of retrosternal chest tightness associated with shortness of breath twice weeklly lasting 20 seconds. States he stops what he is doing and they will resolve. Reports prior evaluation of chest pain years ago but I can not locate in the EMR. Reports he is pending eye exam.   Review of Systems  Constitutional: Positive for fatigue. Fever: "I am dragging in the AM"       Denies snoring and scores 3 on the Epworth screen pending wife's scoring of his sleep habit.       Objective:   Physical Exam  Constitutional: He appears well-developed and well-nourished. No distress.  Cardiovascular: Normal rate and regular rhythm.   Pulmonary/Chest: Breath sounds normal.  Musculoskeletal: He exhibits no edema (of lower extremities).  Psychiatric:  "I just don't want to go to work." Continues to have ongoing job [employer] from another employee.       Assessment:    1. Type 2 diabetes mellitus without complication, without long-term current use of insulin ([place]): controlled - POCT glycosylated hemoglobin (Hb A1C)  2. Other depression: continue Remeron and clonazepam  3. Chest discomfort - Ambulatory referral to Cardiology  4. Fatigue, unspecified type: ? Depression ? Cardiogenic ? metabolic - COMPLETE METABOLIC PANEL WITH GFR - CBC with Differential/Platelet    Plan:    Further f/u pending lab work and  cardiology referral.

## 2017-02-07 ENCOUNTER — Telehealth: Payer: Self-pay

## 2017-02-07 NOTE — Telephone Encounter (Signed)
Received new patient referral from pcp   Patient offered sooner appt but declined due to work schedule

## 2017-02-08 DIAGNOSIS — R079 Chest pain, unspecified: Secondary | ICD-10-CM | POA: Diagnosis not present

## 2017-02-08 DIAGNOSIS — R5382 Chronic fatigue, unspecified: Secondary | ICD-10-CM | POA: Insufficient documentation

## 2017-02-08 DIAGNOSIS — H9201 Otalgia, right ear: Secondary | ICD-10-CM | POA: Diagnosis not present

## 2017-02-08 DIAGNOSIS — Q165 Congenital malformation of inner ear: Secondary | ICD-10-CM | POA: Diagnosis not present

## 2017-02-08 DIAGNOSIS — E119 Type 2 diabetes mellitus without complications: Secondary | ICD-10-CM | POA: Diagnosis not present

## 2017-02-15 ENCOUNTER — Other Ambulatory Visit: Payer: Self-pay | Admitting: Family Medicine

## 2017-02-15 ENCOUNTER — Telehealth: Payer: Self-pay

## 2017-02-15 MED ORDER — MIRTAZAPINE 45 MG PO TABS: 45.0000 mg | ORAL_TABLET | Freq: Every day | ORAL | 2 refills | Status: DC

## 2017-02-15 NOTE — Telephone Encounter (Signed)
-----   Message from Carmon Ginsberg, [place] sent at 02/15/2017  6:56 AM EDT ----- Labs look ok. I will increase dose of Remeron to better treat his depression.

## 2017-02-15 NOTE — Telephone Encounter (Signed)
[id number] 

## 2017-02-20 NOTE — Telephone Encounter (Signed)
Patient advised.KW 

## 2017-02-27 DIAGNOSIS — R079 Chest pain, unspecified: Secondary | ICD-10-CM | POA: Diagnosis not present

## 2017-02-27 DIAGNOSIS — R5382 Chronic fatigue, unspecified: Secondary | ICD-10-CM | POA: Diagnosis not present

## 2017-02-28 ENCOUNTER — Telehealth: Payer: Self-pay | Admitting: Family Medicine

## 2017-02-28 NOTE — Telephone Encounter (Addendum)
ROI ([facility]) faxed to GI Dept. [facility] for records.  Received records.

## 2017-03-15 DIAGNOSIS — E119 Type 2 diabetes mellitus without complications: Secondary | ICD-10-CM | POA: Diagnosis not present

## 2017-03-15 DIAGNOSIS — R079 Chest pain, unspecified: Secondary | ICD-10-CM | POA: Diagnosis not present

## 2017-04-09 ENCOUNTER — Ambulatory Visit: Payer: BLUE CROSS/BLUE SHIELD | Admitting: Cardiovascular Disease

## 2017-04-10 ENCOUNTER — Other Ambulatory Visit: Payer: Self-pay | Admitting: Family Medicine

## 2017-04-11 ENCOUNTER — Other Ambulatory Visit: Payer: Self-pay | Admitting: Family Medicine

## 2017-04-11 NOTE — Telephone Encounter (Signed)
Prescription has been called into pharmacy. KW 

## 2017-04-16 ENCOUNTER — Ambulatory Visit: Payer: BLUE CROSS/BLUE SHIELD | Admitting: Cardiovascular Disease

## 2017-04-30 ENCOUNTER — Other Ambulatory Visit: Payer: Self-pay | Admitting: Family Medicine

## 2017-05-16 ENCOUNTER — Ambulatory Visit: Payer: Self-pay | Admitting: Family Medicine

## 2017-05-23 ENCOUNTER — Other Ambulatory Visit: Payer: Self-pay | Admitting: Family Medicine

## 2017-05-23 ENCOUNTER — Telehealth: Payer: Self-pay | Admitting: Family Medicine

## 2017-05-23 NOTE — Telephone Encounter (Signed)
Patient wife is requesting that you call her before the patient's physical, she states that he is having some issues you should know about it. She is on his [place] [phone])

## 2017-05-24 NOTE — Telephone Encounter (Signed)
Discussion with his wife. She is concerned about his chronic fatigue and wishes further lab workup. He is pending physical on 06/04/17.

## 2017-06-04 ENCOUNTER — Encounter: Payer: Self-pay | Admitting: Family Medicine

## 2017-06-04 ENCOUNTER — Ambulatory Visit (INDEPENDENT_AMBULATORY_CARE_PROVIDER_SITE_OTHER): Payer: BLUE CROSS/BLUE SHIELD | Admitting: Family Medicine

## 2017-06-04 VITALS — BP 156/84 | HR 79 | Temp 98.4°F | Resp 16 | Ht 72.0 in | Wt 185.8 lb

## 2017-06-04 DIAGNOSIS — Z0000 Encounter for general adult medical examination without abnormal findings: Secondary | ICD-10-CM

## 2017-06-04 DIAGNOSIS — Z23 Encounter for immunization: Secondary | ICD-10-CM

## 2017-06-04 DIAGNOSIS — R5383 Other fatigue: Secondary | ICD-10-CM | POA: Diagnosis not present

## 2017-06-04 DIAGNOSIS — E119 Type 2 diabetes mellitus without complications: Secondary | ICD-10-CM

## 2017-06-04 DIAGNOSIS — F439 Reaction to severe stress, unspecified: Secondary | ICD-10-CM | POA: Diagnosis not present
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
## 2017-06-04 NOTE — Patient Instructions (Signed)
We will call you with the lab work. Try two clonazepam at bedtime and see if it helps your sleep pattern. Come in for a nurse bp check in the next week.

## 2017-06-05 ENCOUNTER — Other Ambulatory Visit: Payer: Self-pay | Admitting: Family Medicine

## 2017-06-05 LAB — LIPID PANEL
Chol/HDL Ratio: 2.2 ratio (ref 0.0–5.0)
Cholesterol, Total: 118 mg/dL (ref 100–199)
HDL: 54 mg/dL (ref >39)
LDL Calculated: 43 mg/dL (ref 0–99)
Triglycerides: 103 mg/dL (ref 0–149)
VLDL Cholesterol Cal: 21 mg/dL (ref 5–40)

## 2017-06-05 LAB — CBC WITH DIFFERENTIAL/PLATELET
BASOS: 1 %
Basophils Absolute: 0 10*3/uL (ref 0.0–0.2)
EOS (ABSOLUTE): 0.3 10*3/uL (ref 0.0–0.4)
EOS: 5 %
HEMATOCRIT: 40.8 % (ref 37.5–51.0)
HEMOGLOBIN: 14.2 g/dL (ref 13.0–17.7)
Immature Grans (Abs): 0 10*3/uL (ref 0.0–0.1)
Immature Granulocytes: 0 %
LYMPHS ABS: 1.5 10*3/uL (ref 0.7–3.1)
Lymphs: 28 %
MCH: 31.6 pg (ref 26.6–33.0)
MCHC: 34.8 g/dL (ref 31.5–35.7)
MCV: 91 fL (ref 79–97)
MONOCYTES: 9 %
Monocytes Absolute: 0.5 10*3/uL (ref 0.1–0.9)
NEUTROS ABS: 3.2 10*3/uL (ref 1.4–7.0)
Neutrophils: 57 %
Platelets: 220 10*3/uL (ref 150–379)
RBC: 4.5 x10E6/uL (ref 4.14–5.80)
RDW: 13.8 % (ref 12.3–15.4)
WBC: 5.5 10*3/uL (ref 3.4–10.8)

## 2017-06-05 LAB — COMPREHENSIVE METABOLIC PANEL
A/G RATIO: 2.4 — AB (ref 1.2–2.2)
ALT: 12 IU/L (ref 0–44)
AST: 12 IU/L (ref 0–40)
Albumin: 4.6 g/dL (ref 3.6–4.8)
Alkaline Phosphatase: 64 IU/L (ref 39–117)
BUN/Creatinine Ratio: 13 (ref 10–24)
BUN: 14 mg/dL (ref 8–27)
Bilirubin Total: 0.3 mg/dL (ref 0.0–1.2)
CO2: 23 mmol/L (ref 20–29)
Calcium: 9.4 mg/dL (ref 8.6–10.2)
Chloride: 104 mmol/L (ref 96–106)
Creatinine, Ser: 1.04 mg/dL (ref 0.76–1.27)
GFR calc Af Amer: 86 mL/min/{1.73_m2} (ref >59)
GFR calc non Af Amer: 74 mL/min/{1.73_m2} (ref >59)
GLOBULIN, TOTAL: 1.9 g/dL (ref 1.5–4.5)
Glucose: 139 mg/dL — ABNORMAL HIGH (ref 65–99)
POTASSIUM: 4.1 mmol/L (ref 3.5–5.2)
Sodium: 141 mmol/L (ref 134–144)
TOTAL PROTEIN: 6.5 g/dL (ref 6.0–8.5)

## 2017-06-05 LAB — MICROALBUMIN / CREATININE URINE RATIO
CREATININE, UR: 138.4 mg/dL
MICROALBUM., U, RANDOM: 4.1 ug/mL
Microalb/Creat Ratio: 3 mg/g creat (ref 0.0–30.0)

## 2017-06-05 LAB — VITAMIN B12: VITAMIN B 12: 718 pg/mL (ref 232–1245)

## 2017-06-05 LAB — VITAMIN D 25 HYDROXY (VIT D DEFICIENCY, FRACTURES): VIT D 25 HYDROXY: 30.1 ng/mL (ref 30.0–100.0)

## 2017-06-05 LAB — T4, FREE: Free T4: 1.01 ng/dL (ref 0.82–1.77)

## 2017-06-05 LAB — HEMOGLOBIN A1C
ESTIMATED AVERAGE GLUCOSE: 154 mg/dL
Hgb A1c MFr Bld: 7 % — ABNORMAL HIGH (ref 4.8–5.6)

## 2017-06-05 LAB — TSH: TSH: 1.54 u[IU]/mL (ref 0.450–4.500)

## 2017-07-11 ENCOUNTER — Telehealth: Payer: Self-pay | Admitting: Family Medicine

## 2017-07-11 NOTE — Telephone Encounter (Signed)
Please Review. KW

## 2017-07-11 NOTE — Telephone Encounter (Signed)
Pt states the new dosage of Clonazepam .5 mg  is working better now that the patient is take two daily.  Pt is requesting a RX of 1 MG Clonazepam daily be sent to [address] in [place].

## 2017-07-12 ENCOUNTER — Other Ambulatory Visit: Payer: Self-pay | Admitting: Family Medicine

## 2017-07-12 MED ORDER — CLONAZEPAM 1 MG PO TABS: ORAL_TABLET | ORAL | 1 refills | Status: DC

## 2017-07-12 NOTE — Telephone Encounter (Signed)
Please call in new dose of clonazepam as updated in the EMR

## 2017-07-12 NOTE — Telephone Encounter (Signed)
Called in Rx as below.  

## 2017-08-03 ENCOUNTER — Emergency Department: Payer: BLUE CROSS/BLUE SHIELD

## 2017-08-03 ENCOUNTER — Encounter: Payer: Self-pay | Admitting: Emergency Medicine

## 2017-08-03 ENCOUNTER — Emergency Department
Admission: EM | Admit: 2017-08-03 | Discharge: 2017-08-03 | Disposition: A | Discharge status: Home / Self Care | Payer: BLUE CROSS/BLUE SHIELD | Attending: Emergency Medicine | Admitting: Emergency Medicine

## 2017-08-03 ENCOUNTER — Other Ambulatory Visit: Payer: Self-pay

## 2017-08-03 DIAGNOSIS — X500XXA Overexertion from strenuous movement or load, initial encounter: Secondary | ICD-10-CM | POA: Diagnosis not present

## 2017-08-03 DIAGNOSIS — M25551 Pain in right hip: Secondary | ICD-10-CM | POA: Diagnosis not present

## 2017-08-03 DIAGNOSIS — Z79899 Other long term (current) drug therapy: Secondary | ICD-10-CM | POA: Insufficient documentation

## 2017-08-03 DIAGNOSIS — E119 Type 2 diabetes mellitus without complications: Secondary | ICD-10-CM | POA: Diagnosis not present

## 2017-08-03 DIAGNOSIS — M5416 Radiculopathy, lumbar region: Secondary | ICD-10-CM

## 2017-08-03 MED ORDER — KETOROLAC TROMETHAMINE 10 MG PO TABS: 10.0000 mg | ORAL_TABLET | Freq: Four times a day (QID) | ORAL | 0 refills | Status: DC | PRN

## 2017-08-03 MED ORDER — KETOROLAC TROMETHAMINE 30 MG/ML IJ SOLN
30.0000 mg | Freq: Once | INTRAMUSCULAR | Status: AC
  Administered 2017-08-03: 30 mg via INTRAMUSCULAR
  Filled 2017-08-03: qty 1

## 2017-08-03 MED ORDER — CYCLOBENZAPRINE HCL 5 MG PO TABS: ORAL_TABLET | ORAL | 0 refills | Status: DC

## 2017-08-03 MED ORDER — OXYCODONE-ACETAMINOPHEN 5-325 MG PO TABS
1.0000 | ORAL_TABLET | Freq: Once | ORAL | Status: AC
  Administered 2017-08-03: 1 via ORAL
  Filled 2017-08-03: qty 1

## 2017-08-03 NOTE — ED Provider Notes (Signed)
[facility] Emergency Department Provider Note  ____________________________________________  Time seen: Approximately 10:25 PM  I have reviewed the triage vital signs and the nursing notes.   HISTORY  Chief Complaint Hip Pain    HPI Billy Hardin is a 67 y.o. male presents to the emergency department for evaluation of left hip pain for 1 day.  Pain is primarily on the side of his hip and he describes it as a shooting pain.  Pain is worse with weightbearing.  He took a tramadol this evening for pain.  Patient works in a [employer] and is on his feet all day.  He does a lot of lifting at work.  No specific trauma.  No bowel or bladder dysfunction or saddle paresthesias.  No nausea, vomiting, abdominal pain, back pain, dysuria, urgency, frequency.  Past Medical History:  Diagnosis Date  . Complication of anesthesia    had catheter 4 days unable to void  . Deaf    Rt ear  . Diabetes mellitus without complication ([place])   . Elevated lipids   . Herpes   . Insomnia   . Seasonal allergies   . TMJ (dislocation of temporomandibular joint)   . Uses hearing aid    Left    Patient Active Problem List   Diagnosis Date Noted  . Chronic fatigue, unspecified 02/08/2017  . Chest pain with high risk for cardiac etiology 02/08/2017  . Chronic migraine without aura without status migrainosus, not intractable 05/30/2016  . Depression 05/30/2016  . Male hypogonadism 05/25/2015  . Genital herpes 05/25/2015  . Diabetes mellitus ([place]) 05/25/2015  . Situational stress 05/25/2015  . History of kidney stones 05/25/2015  . Diabetes mellitus type 2, uncomplicated ([place]) 92/03/9416  . ASNHL (asymmetrical sensorineural hearing loss) 09/15/2014  . H/O adenomatous polyp of colon 08/07/2013  . Insomnia 01/21/2008  . Family history of cardiovascular disease 02/04/2007    Past Surgical History:  Procedure Laterality Date  . KIDNEY STONE SURGERY    . MIDDLE EAR SURGERY    .  PENILE PROSTHESIS IMPLANT N/A 11/03/2014   Procedure: PENILE PROTHESIS INFLATABLE;  Surgeon: Royston Cowper, MD;  Location: [facility] ORS;  Service: Urology;  Laterality: N/A;  . TONSILLECTOMY AND ADENOIDECTOMY      Prior to Admission medications   Medication Sig Start Date End Date Taking? Authorizing Provider  atorvastatin (LIPITOR) 20 MG tablet TAKE 1 TABLET BY MOUTH EVERY DAY 10/18/16   Carmon Ginsberg, PA  blood glucose meter kit and supplies KIT Dispense based on patient and insurance preference. Check sugar daily 07/01/15   Carmon Ginsberg, PA  cholecalciferol (VITAMIN D) 400 units TABS tablet Take 400 Units by mouth.    [provider]  CINNAMON PO Take 2 capsules by mouth daily.    [provider]  clonazePAM Bobbye Charleston) 1 MG tablet Take 1/2 to one in the AM and one at bedtime as needed for anxiety 07/12/17   Carmon Ginsberg, PA  cyclobenzaprine (FLEXERIL) 5 MG tablet Take 1-2 tablets 3 times daily as needed 08/03/17   Laban Emperor, PA-C  ketorolac (TORADOL) 10 MG tablet Take 1 tablet (10 mg total) by mouth every 6 (six) hours as needed. 08/03/17   Laban Emperor, PA-C  meloxicam (MOBIC) 7.5 MG tablet TK 1 T PO ONCE D 01/31/17   [provider]  metFORMIN (GLUCOPHAGE) 1000 MG tablet TAKE 1 TABLET(1000 MG) BY MOUTH TWICE DAILY WITH A MEAL 11/20/16   Carmon Ginsberg, PA  metoprolol succinate (TOPROL-XL) 25 MG  24 hr tablet TAKE 1 TABLET(25 MG) BY MOUTH DAILY 04/30/17   Carmon Ginsberg, PA  mirtazapine (REMERON) 45 MG tablet TAKE 1 TABLET(45 MG) BY MOUTH AT BEDTIME 05/23/17   Carmon Ginsberg, PA  omeprazole (PRILOSEC) 40 MG capsule TK 1 C PO QD 01/24/17   [provider]  ONE TOUCH ULTRA TEST test strip CHECK BLOOD SUGAR DAILY 06/10/16   Carmon Ginsberg, PA  [facility] DELICA LANCETS 30Q MISC TEST BLOOD SUGAR DAILY 07/15/16   Carmon Ginsberg, PA  valACYclovir (VALTREX) 1000 MG tablet TAKE 1 TABLET(1000 MG) BY MOUTH DAILY 10/18/16   Carmon Ginsberg, PA  vitamin B-12  (CYANOCOBALAMIN) 1000 MCG tablet Take 1,000 mcg by mouth daily.    [provider]  vitamin E 400 UNIT capsule Take 400 Units by mouth 2 (two) times daily.    [provider]    Allergies Penicillins  Family History  Problem Relation Age of Onset  . Cancer Mother   . CAD Brother     Social History Social History   Tobacco Use  . Smoking status: Never Smoker  . Smokeless tobacco: Never Used  Substance Use Topics  . Alcohol use: Yes    Alcohol/week: 0.0 oz    Comment: 1-2 drinks total of 2-3x a year  . Drug use: No     Review of Systems  Constitutional: No fever/chills Gastrointestinal: No abdominal pain.  No nausea, no vomiting.  Genitourinary: Negative for dysuria. Musculoskeletal: Positive for hip pain. Skin: Negative for rash, abrasions, lacerations, ecchymosis. Neurological: Negative for numbness or tingling   ____________________________________________   PHYSICAL EXAM:  VITAL SIGNS: ED Triage Vitals [08/03/17 2034]  Enc Vitals Group     BP (!) 143/67     Pulse Rate 89     Resp 18     Temp 98.2 F (36.8 C)     Temp Source Oral     SpO2 98 %     Weight 175 lb (79.4 kg)     Height 6' (1.829 m)     Head Circumference      Peak Flow      Pain Score 3     Pain Loc      Pain Edu?      Excl. in [place]?      Constitutional: Alert and oriented. Well appearing and in no acute distress. Eyes: Conjunctivae are normal. PERRL. EOMI. Head: Atraumatic. ENT:      Ears:      Nose: No congestion/rhinnorhea.      Mouth/Throat: Mucous membranes are moist.  Neck: No stridor. Cardiovascular: Normal rate, regular rhythm.  Good peripheral circulation. Respiratory: Normal respiratory effort without tachypnea or retractions. Lungs CTAB. Good air entry to the bases with no decreased or absent breath sounds. Gastrointestinal: Bowel sounds 4 quadrants. Soft and nontender to palpation. No guarding or rigidity. No palpable masses. No distention.   Musculoskeletal: Full range of motion to all extremities. No gross deformities appreciated. No tenderness to palpation over lumbar spine.  Minimal tenderness to palpation over lateral hip.  Full external and internal rotation of hip.  Strength 5 out of 5 in lower extremities bilaterally. Neurologic:  Normal speech and language. No gross focal neurologic deficits are appreciated.  Skin:  Skin is warm, dry and intact. No rash noted.   ____________________________________________   LABS (all labs ordered are listed, but only abnormal results are displayed)  Labs Reviewed - No data to display ____________________________________________  EKG   ____________________________________________  RADIOLOGY Robinette Haines,  personally viewed and evaluated these images (plain radiographs) as part of my medical decision making, as well as reviewing the written report by the radiologist.  Dg Hip Unilat  With Pelvis 2-3 Views Right  Result Date: 08/03/2017 CLINICAL DATA:  Right hip pain for 1 or 2 days.  No known injury. EXAM: DG HIP (WITH OR WITHOUT PELVIS) 2-3V RIGHT COMPARISON:  Pelvic CT 11/19/2015. FINDINGS: The mineralization and alignment are normal. There is no evidence of acute fracture or dislocation. No evidence of femoral head avascular necrosis. The hip joint spaces are maintained. There are stable small bone islands in the right acetabulum. Left-sided lumbosacral assimilation joint and penile prosthesis noted. IMPRESSION: No acute osseous findings or significant arthropathic changes. Electronically Signed   By: Richardean Sale M.D.   On: 08/03/2017 21:02    ____________________________________________    PROCEDURES  Procedure(s) performed:    Procedures    Medications  ketorolac (TORADOL) 30 MG/ML injection 30 mg (30 mg Intramuscular Given 08/03/17 2247)  oxyCODONE-acetaminophen (PERCOCET/ROXICET) 5-325 MG per tablet 1 tablet (1 tablet Oral Given 08/03/17 2248)      ____________________________________________   INITIAL IMPRESSION / ASSESSMENT AND PLAN / ED COURSE  Pertinent labs & imaging results that were available during my care of the patient were reviewed by me and considered in my medical decision making (see chart for details).  Review of the [place] CSRS was performed in accordance of the [place] prior to dispensing any controlled drugs.   Patient presented to emergency department for evaluation of right hip pain.  Vital signs and exam are reassuring.  X-ray negative for acute bony abnormality.  Symptoms are consistent with radiculopathy. Patient will be discharged home with prescriptions for toradol and flexeril. Patient is to follow up with PCP as directed. Patient is given ED precautions to return to the ED for any worsening or new symptoms.     ____________________________________________  FINAL CLINICAL IMPRESSION(S) / ED DIAGNOSES  Final diagnoses:  Right hip pain  Lumbar radiculopathy      NEW MEDICATIONS STARTED DURING THIS VISIT:  ED Discharge Orders        Ordered    ketorolac (TORADOL) 10 MG tablet  Every 6 hours PRN     08/03/17 2240    cyclobenzaprine (FLEXERIL) 5 MG tablet     08/03/17 2240          This chart was dictated using voice recognition software/Dragon. Despite best efforts to proofread, errors can occur which can change the meaning. Any change was purely unintentional.    Laban Emperor, PA-C 08/03/17 2333    Harvest Dark, MD 08/04/17 [phone]

## 2017-08-03 NOTE — ED Triage Notes (Signed)
Pt arrives POV with c/o right hip pain that radiates down his right leg. Pt has hearing deficit which he has had for life and his hearing aid is not working at this time. Pt denies trauma or injury and is in NAD.

## 2017-10-24 ENCOUNTER — Other Ambulatory Visit: Payer: Self-pay | Admitting: Family Medicine

## 2017-10-29 ENCOUNTER — Other Ambulatory Visit: Payer: Self-pay | Admitting: Family Medicine

## 2017-10-31 ENCOUNTER — Telehealth: Payer: Self-pay

## 2017-10-31 NOTE — Telephone Encounter (Signed)
FYI. KW 

## 2017-10-31 NOTE — Telephone Encounter (Signed)
Patient's wife called reporting that patient has been having watery diarrhea for several weeks. Patient did travel to [facility] a few weeks ago. Mrs. Lemelin requested CB at to see if he needs office visit.

## 2017-10-31 NOTE — Telephone Encounter (Signed)
Pt's wife Jenny Reichmann returned call. I advised CMA was with a pt and they were calling to schedule OV. Jenny Reichmann stated she couldn't get pt in today and requested an appt for tomorrow as late in the morning as possible. OV is scheduled for 11/01/17 @ 4 pm with [name]. Please advise. Thanks TNP

## 2017-10-31 NOTE — Telephone Encounter (Signed)
Left message to call back, patient needs appointment for evaluation and testing. KW

## 2017-11-01 ENCOUNTER — Encounter: Payer: Self-pay | Admitting: Family Medicine

## 2017-11-01 ENCOUNTER — Other Ambulatory Visit: Payer: Self-pay | Admitting: Family Medicine

## 2017-11-01 ENCOUNTER — Ambulatory Visit (INDEPENDENT_AMBULATORY_CARE_PROVIDER_SITE_OTHER): Payer: BLUE CROSS/BLUE SHIELD | Admitting: Family Medicine

## 2017-11-01 VITALS — BP 138/74 | HR 78 | Temp 98.2°F | Resp 16 | Wt 183.4 lb

## 2017-11-01 DIAGNOSIS — F439 Reaction to severe stress, unspecified: Secondary | ICD-10-CM | POA: Diagnosis not present

## 2017-11-01 DIAGNOSIS — A09 Infectious gastroenteritis and colitis, unspecified: Secondary | ICD-10-CM | POA: Diagnosis not present

## 2017-11-01 DIAGNOSIS — R12 Heartburn: Secondary | ICD-10-CM | POA: Diagnosis not present

## 2017-11-01 MED ORDER — AZITHROMYCIN 500 MG PO TABS: 500.0000 mg | ORAL_TABLET | Freq: Every day | ORAL | 0 refills | Status: DC

## 2017-11-01 MED ORDER — CLONAZEPAM 1 MG PO TABS: ORAL_TABLET | ORAL | 1 refills | Status: DC

## 2017-11-01 MED ORDER — OMEPRAZOLE 40 MG PO CPDR: DELAYED_RELEASE_CAPSULE | ORAL | 1 refills | Status: DC

## 2017-11-01 NOTE — Progress Notes (Signed)
  Subjective:     Patient ID: Billy Hardin, male   DOB: 1950-11-04, 67 y.o.   MRN: [id number] Chief Complaint  Patient presents with  . Diarrhea    Patient comes into office today with concerns of loose stools [place], patient states that he returned home on 6[place]4 [place] when symptoms began. Patient denies any abdominal pain, nausea or vomiting.    HPI States his wife has also had diarrhea and is coming in for a check today as well. States he is having loose stools at this time twice a day. No antibiotic use. Also wishes refill on clonazepam and omeprazole.  Review of Systems     Objective:   Physical Exam  Constitutional: He appears well-developed and well-nourished.  Abdominal: Soft. There is no tenderness. There is no guarding.       Assessment:    1. Traveler's diarrhea - azithromycin (ZITHROMAX) 500 MG tablet; Take 1 tablet (500 mg total) by mouth daily.  Dispense: 3 tablet; Refill: 0  2. Heartburn - omeprazole (PRILOSEC) 40 MG capsule; One daily as needed for heartburn  Dispense: 90 capsule; Refill: 1  3. Situational stress - clonazePAM (KLONOPIN) 1 MG tablet; Take 1/2 to one in the AM and one at bedtime as needed for anxiety  Dispense: 60 tablet; Refill: 1    Plan:    Further f/u if not improving. Will schedule for diabetes f/u when better.

## 2017-11-01 NOTE — Patient Instructions (Signed)
Let me know if diarrhea does not improve. Once you are better come in for a diabetes follow up.

## 2017-11-25 ENCOUNTER — Other Ambulatory Visit: Payer: Self-pay | Admitting: Family Medicine

## 2017-11-25 DIAGNOSIS — E119 Type 2 diabetes mellitus without complications: Secondary | ICD-10-CM

## 2017-12-27 ENCOUNTER — Other Ambulatory Visit: Payer: Self-pay | Admitting: Family Medicine

## 2018-01-26 ENCOUNTER — Other Ambulatory Visit: Payer: Self-pay | Admitting: Family Medicine

## 2018-01-28 ENCOUNTER — Ambulatory Visit (INDEPENDENT_AMBULATORY_CARE_PROVIDER_SITE_OTHER): Payer: BLUE CROSS/BLUE SHIELD | Admitting: Family Medicine

## 2018-01-28 ENCOUNTER — Other Ambulatory Visit: Payer: Self-pay | Admitting: Family Medicine

## 2018-01-28 ENCOUNTER — Encounter: Payer: Self-pay | Admitting: Family Medicine

## 2018-01-28 VITALS — BP 120/60 | HR 82 | Temp 98.1°F | Resp 16 | Wt 181.0 lb

## 2018-01-28 DIAGNOSIS — H2513 Age-related nuclear cataract, bilateral: Secondary | ICD-10-CM | POA: Diagnosis not present

## 2018-01-28 DIAGNOSIS — F439 Reaction to severe stress, unspecified: Secondary | ICD-10-CM | POA: Diagnosis not present

## 2018-01-28 DIAGNOSIS — E119 Type 2 diabetes mellitus without complications: Secondary | ICD-10-CM

## 2018-01-28 DIAGNOSIS — E089 Diabetes mellitus due to underlying condition without complications: Secondary | ICD-10-CM | POA: Diagnosis not present

## 2018-01-28 DIAGNOSIS — G43709 Chronic migraine without aura, not intractable, without status migrainosus: Secondary | ICD-10-CM | POA: Diagnosis not present

## 2018-01-28 DIAGNOSIS — H524 Presbyopia: Secondary | ICD-10-CM | POA: Diagnosis not present

## 2018-01-28 LAB — POCT GLYCOSYLATED HEMOGLOBIN (HGB A1C): Hemoglobin A1C: 6.6 % — AB (ref 4.0–5.6)

## 2018-01-28 MED ORDER — METOPROLOL SUCCINATE ER 50 MG PO TB24: 50.0000 mg | ORAL_TABLET | Freq: Every day | ORAL | 0 refills | Status: DC

## 2018-01-28 NOTE — Progress Notes (Signed)
  Subjective:     Patient ID: Billy Hardin, male   DOB: January 25, 1951, 67 y.o.   MRN: [id number] Chief Complaint  Patient presents with  . Diabetes   HPI Reports 3 migraine headaches this past week. Treats at work with Tylenol. At home he will take Excedrin migraine, apply a cold cloth and lie in bed. Reports compliance with metoprolol.Continues to have a significant amount of stress at work from a co-employee. Takes clonazepam and mirtazapine at night and states this allows him to sleep ok. He has also resumed therapy sessions with Miguel Dibble. He is updating his eye exam this week.  Review of Systems     Objective:   Physical Exam  Constitutional: He appears well-developed and well-nourished. No distress.  Lungs: clear Heart: RRR without murmur Lower extremities: no edema; Left DP 1+; PT 2+/Right DP/PT 2+; sensation to monofilament intact, no wounds noted.     Assessment:    1. Type 2 diabetes mellitus without complication, without long-term current use of insulin ([place]): stable - POCT glycosylated hemoglobin (Hb A1C)  2. Situational stress: continue medication and counseling 3. Chronic migraine without aura without status migrainosus, not intractable: increase medication for improved control  - metoprolol succinate (TOPROL-XL) 50 MG 24 hr tablet; Take 1 tablet (50 mg total) by mouth daily. Take with or immediately following a meal.  Dispense: 90 tablet; Refill: 0    Plan:    F/u in 3 months.

## 2018-01-28 NOTE — Patient Instructions (Signed)
I have increased your medication for headache prevention, metoprolol. Let me know if you can't tolerate the medication. Continue with counseling as you are doing.

## 2018-01-29 DIAGNOSIS — H6121 Impacted cerumen, right ear: Secondary | ICD-10-CM | POA: Diagnosis not present

## 2018-01-29 DIAGNOSIS — H903 Sensorineural hearing loss, bilateral: Secondary | ICD-10-CM | POA: Diagnosis not present

## 2018-01-31 LAB — HM DIABETES EYE EXAM

## 2018-02-05 ENCOUNTER — Telehealth: Payer: Self-pay | Admitting: Family Medicine

## 2018-02-05 NOTE — Telephone Encounter (Signed)
Needing to verify if we received the medical records request. Case - [id number]  Please advise.  Thanks, [employer]

## 2018-02-11 NOTE — Telephone Encounter (Signed)
Have you seen any lab record on your desk for this patient? From [facility]?KW

## 2018-02-16 ENCOUNTER — Encounter: Payer: Self-pay | Admitting: Family Medicine

## 2018-02-16 ENCOUNTER — Ambulatory Visit (INDEPENDENT_AMBULATORY_CARE_PROVIDER_SITE_OTHER): Payer: BLUE CROSS/BLUE SHIELD | Admitting: Family Medicine

## 2018-02-16 VITALS — BP 110/60 | HR 75 | Temp 98.5°F | Resp 16 | Wt 179.0 lb

## 2018-02-16 DIAGNOSIS — K130 Diseases of lips: Secondary | ICD-10-CM

## 2018-02-16 NOTE — Patient Instructions (Signed)
Let me know if not improving for referral.

## 2018-02-16 NOTE — Progress Notes (Signed)
  Subjective:     Patient ID: Billy Hardin, male   DOB: August 05, 1950, 67 y.o.   MRN: [id number] . Chief Complaint  Patient presents with  . Mouth Lesions    on upper inner lip, x's a few weeks.   HPI States he has seen his dentist recently but no comment was made about his upper inner lip sore. Denies injury or hot beverages. No hx of cold sores and is on suppressive treatment for genital HSV. Has also seen ENT about his hearing but lip was not addressed at that time.  Review of Systems     Objective:   Physical Exam  Constitutional: He appears well-developed and well-nourished. No distress.  HENT:  Upper inner lip with white linear lesion. No vesicles or tenderness on palpation.       Assessment:    1. Lip lesion: ? Resolving oral injury/burn-rx for MMW with prednisone, Benadryl and viscous lidocaine.     Plan:    Refer back to ENT if not improving.

## 2018-02-20 ENCOUNTER — Other Ambulatory Visit: Payer: Self-pay | Admitting: Family Medicine

## 2018-02-20 DIAGNOSIS — A09 Infectious gastroenteritis and colitis, unspecified: Secondary | ICD-10-CM

## 2018-02-24 ENCOUNTER — Other Ambulatory Visit: Payer: Self-pay | Admitting: Family Medicine

## 2018-02-24 DIAGNOSIS — E119 Type 2 diabetes mellitus without complications: Secondary | ICD-10-CM

## 2018-02-25 DIAGNOSIS — H903 Sensorineural hearing loss, bilateral: Secondary | ICD-10-CM | POA: Diagnosis not present

## 2018-03-10 ENCOUNTER — Other Ambulatory Visit: Payer: Self-pay | Admitting: Family Medicine

## 2018-03-10 DIAGNOSIS — F439 Reaction to severe stress, unspecified: Secondary | ICD-10-CM

## 2018-03-28 ENCOUNTER — Other Ambulatory Visit: Payer: Self-pay | Admitting: Family Medicine

## 2018-03-28 DIAGNOSIS — F329 Major depressive disorder, single episode, unspecified: Secondary | ICD-10-CM

## 2018-03-28 DIAGNOSIS — F32A Depression, unspecified: Secondary | ICD-10-CM

## 2018-04-05 DIAGNOSIS — R195 Other fecal abnormalities: Secondary | ICD-10-CM | POA: Diagnosis not present

## 2018-04-05 DIAGNOSIS — K5792 Diverticulitis of intestine, part unspecified, without perforation or abscess without bleeding: Secondary | ICD-10-CM | POA: Diagnosis not present

## 2018-04-05 DIAGNOSIS — R1084 Generalized abdominal pain: Secondary | ICD-10-CM | POA: Diagnosis not present

## 2018-04-25 ENCOUNTER — Telehealth: Payer: Self-pay | Admitting: Family Medicine

## 2018-04-25 ENCOUNTER — Other Ambulatory Visit: Payer: Self-pay | Admitting: Family Medicine

## 2018-04-25 DIAGNOSIS — G43709 Chronic migraine without aura, not intractable, without status migrainosus: Secondary | ICD-10-CM

## 2018-04-25 NOTE — Telephone Encounter (Signed)
Request has been answered. KW

## 2018-04-25 NOTE — Telephone Encounter (Signed)
[facility] faxed refill request for the following medications:  metoprolol succinate (TOPROL-XL) 50 MG 24 hr tablet   90 day supply  Please advise. Thanks TNP

## 2018-04-29 ENCOUNTER — Encounter: Payer: Self-pay | Admitting: Family Medicine

## 2018-04-29 ENCOUNTER — Ambulatory Visit (INDEPENDENT_AMBULATORY_CARE_PROVIDER_SITE_OTHER): Payer: BLUE CROSS/BLUE SHIELD | Admitting: Family Medicine

## 2018-04-29 VITALS — BP 102/60 | HR 66 | Temp 98.0°F | Resp 16 | Wt 177.8 lb

## 2018-04-29 DIAGNOSIS — Z8669 Personal history of other diseases of the nervous system and sense organs: Secondary | ICD-10-CM | POA: Insufficient documentation

## 2018-04-29 DIAGNOSIS — E119 Type 2 diabetes mellitus without complications: Secondary | ICD-10-CM | POA: Diagnosis not present

## 2018-04-29 DIAGNOSIS — F439 Reaction to severe stress, unspecified: Secondary | ICD-10-CM | POA: Diagnosis not present

## 2018-04-29 LAB — POCT GLYCOSYLATED HEMOGLOBIN (HGB A1C): Hemoglobin A1C: 6.7 % — AB (ref 4.0–5.6)

## 2018-04-29 NOTE — Patient Instructions (Addendum)
Try stretching your thigh before bedtime and see if that helps with your pain. We will update your labs at your next visit for a physical

## 2018-04-29 NOTE — Progress Notes (Addendum)
  Subjective:     Patient ID: Billy Hardin, male   DOB: 1950/08/07, 67 y.o.   MRN: [id number] Chief Complaint  Patient presents with  . Diabetes    Patient returns to office today for 3 month follow up from 01/28/18. Patient HgbA1C at last visit was 6.6% condition was stable. Patient reports blood sugar readings at home range from 130s-144. Patient denies symptoms of polydipsia or polyuria, patient reports good compliance on medication.  . Migraine    Patient returns for 3 month follow up, at last office visit we increased Toprol for better symptom control. Patient reports that he still has migraines but is much improved.    HPI States he continues to see a therapist and will have a f/u on 12/21. He feels like the medication is helping him but states his therapist may be contacting me about adjustments. Reports flu shot at work in October.  Review of Systems  Cardiovascular: Negative for chest pain.  Musculoskeletal:       Reports nightly left thigh pain. Reports using a portable stair at work.       Objective:   Physical Exam Constitutional:      General: He is not in acute distress.    Appearance: Normal appearance.  Cardiovascular:     Rate and Rhythm: Normal rate and regular rhythm.     Pulses:          Dorsalis pedis pulses are 2+ on the left side.       Posterior tibial pulses are 1+ on the left side.  Pulmonary:     Breath sounds: Normal breath sounds.  Musculoskeletal:     Right lower leg: No edema.     Left lower leg: No edema.     Comments: Left lower extremity flexion/extension 5/5. SLR to 80 degrees without discomfort. Left HIP IR/ER without discomfort. No calf tenderenss  Neurological:     Mental Status: He is alert.        Assessment:    1. Type 2 diabetes mellitus without complication, without long-term current use of insulin ([place]): stable - POCT glycosylated hemoglobin (Hb A1C)  2. Situational stress; Continue current medication  3. History of migraine  headaches: continue metoprolol    Plan:    Discussed stretching thigh before bedtime. Will schedule a physical at next o.v.

## 2018-05-17 ENCOUNTER — Encounter: Payer: Self-pay | Admitting: Family Medicine

## 2018-05-17 ENCOUNTER — Ambulatory Visit
Admission: RE | Admit: 2018-05-17 | Discharge: 2018-05-17 | Disposition: A | Discharge status: Home / Self Care | Payer: BLUE CROSS/BLUE SHIELD | Attending: Family Medicine | Admitting: Family Medicine

## 2018-05-17 ENCOUNTER — Ambulatory Visit (INDEPENDENT_AMBULATORY_CARE_PROVIDER_SITE_OTHER): Payer: BLUE CROSS/BLUE SHIELD | Admitting: Family Medicine

## 2018-05-17 ENCOUNTER — Ambulatory Visit
Admission: RE | Admit: 2018-05-17 | Discharge: 2018-05-17 | Disposition: A | Discharge status: Home / Self Care | Payer: BLUE CROSS/BLUE SHIELD | Source: Ambulatory Visit | Attending: Family Medicine | Admitting: Family Medicine

## 2018-05-17 ENCOUNTER — Other Ambulatory Visit: Payer: Self-pay

## 2018-05-17 VITALS — BP 136/60 | HR 96 | Temp 98.6°F | Ht 72.0 in | Wt 182.8 lb

## 2018-05-17 DIAGNOSIS — M79605 Pain in left leg: Secondary | ICD-10-CM

## 2018-05-17 DIAGNOSIS — G8929 Other chronic pain: Secondary | ICD-10-CM

## 2018-05-17 DIAGNOSIS — J029 Acute pharyngitis, unspecified: Secondary | ICD-10-CM | POA: Diagnosis not present

## 2018-05-17 DIAGNOSIS — M160 Bilateral primary osteoarthritis of hip: Secondary | ICD-10-CM | POA: Diagnosis not present

## 2018-05-17 LAB — POCT RAPID STREP A (OFFICE): RAPID STREP A SCREEN: NEGATIVE

## 2018-05-17 MED ORDER — HYDROCODONE-ACETAMINOPHEN 5-325 MG PO TABS: ORAL_TABLET | ORAL | 0 refills | Status: DC

## 2018-05-17 NOTE — Patient Instructions (Signed)
We will call you about the lab work and x-ray result. Start Aleve two pills twice daily with food. I will add short term use of a pain medication as well.

## 2018-05-17 NOTE — Progress Notes (Addendum)
  Subjective:     Patient ID: Billy Hardin, male   DOB: 05-09-1951, 68 y.o.   MRN: [id number] Chief Complaint  Patient presents with  . Neck Pain    since 05/16/2018 woke up and neck sore, and has headache   HPI After further discussion patient presents with in the last two weeks of fever to 101.9, runny nose, sore throat, headache malaise, and now bilateral anterior and posterior neck pain. Also complains of nocturnal left sided hip pain which radiates down his whole leg. Denies pain with w.b. or injury. Pain is not present on presentation.  Review of Systems     Objective:   Physical Exam Constitutional:      General: He is not in acute distress.    Appearance: Normal appearance. He is not ill-appearing.  Cardiovascular:     Pulses:          Dorsalis pedis pulses are 2+ on the left side.       Posterior tibial pulses are 2+ on the left side.  Musculoskeletal:     Comments: Left lower extremity with 5/5 strength. SLR to 90 degrees without discomfort. IR/ER without discomfort.  Neurological:     Mental Status: He is alert.  Psychiatric:     Comments: Depressed affect per his baseline        Assessment:    1. Chronic pain of left lower extremity: rx for hydrocodone - DG HIP UNILAT WITH PELVIS 2-3 VIEWS LEFT; Future  2. Pharyngitis, unspecified etiology - POCT rapid strep A - CBC with Differential/Platelet - Epstein-Barr virus VCA antibody panel    Plan:    Further f/u pending lab and x-ray results. Discussed scheduling Aleve. Work excuse for today.

## 2018-05-18 LAB — CBC WITH DIFFERENTIAL/PLATELET
Basophils Absolute: 0.1 10*3/uL (ref 0.0–0.2)
Basos: 1 %
EOS (ABSOLUTE): 0.2 10*3/uL (ref 0.0–0.4)
Eos: 2 %
Hematocrit: 37.2 % — ABNORMAL LOW (ref 37.5–51.0)
Hemoglobin: 12.8 g/dL — ABNORMAL LOW (ref 13.0–17.7)
Immature Grans (Abs): 0 10*3/uL (ref 0.0–0.1)
Immature Granulocytes: 0 %
Lymphocytes Absolute: 1.2 10*3/uL (ref 0.7–3.1)
Lymphs: 12 %
MCH: 32.1 pg (ref 26.6–33.0)
MCHC: 34.4 g/dL (ref 31.5–35.7)
MCV: 93 fL (ref 79–97)
MONOS ABS: 1 10*3/uL — AB (ref 0.1–0.9)
Monocytes: 10 %
Neutrophils Absolute: 7.5 10*3/uL — ABNORMAL HIGH (ref 1.4–7.0)
Neutrophils: 75 %
Platelets: 272 10*3/uL (ref 150–450)
RBC: 3.99 x10E6/uL — ABNORMAL LOW (ref 4.14–5.80)
RDW: 12.2 % — ABNORMAL LOW (ref 12.3–15.4)
WBC: 9.9 10*3/uL (ref 3.4–10.8)

## 2018-05-18 LAB — EPSTEIN-BARR VIRUS VCA ANTIBODY PANEL
EBV Early Antigen Ab, IgG: <9 U/mL (ref 0.0–8.9)
EBV NA IgG: 196 U/mL — ABNORMAL HIGH (ref 0.0–17.9)
EBV VCA IgG: 222 U/mL — ABNORMAL HIGH (ref 0.0–17.9)
EBV VCA IgM: <36 U/mL (ref 0.0–35.9)

## 2018-05-20 DIAGNOSIS — M5417 Radiculopathy, lumbosacral region: Secondary | ICD-10-CM | POA: Diagnosis not present

## 2018-05-20 DIAGNOSIS — J019 Acute sinusitis, unspecified: Secondary | ICD-10-CM | POA: Diagnosis not present

## 2018-05-20 DIAGNOSIS — B9689 Other specified bacterial agents as the cause of diseases classified elsewhere: Secondary | ICD-10-CM | POA: Diagnosis not present

## 2018-05-25 ENCOUNTER — Other Ambulatory Visit: Payer: Self-pay | Admitting: Family Medicine

## 2018-05-25 DIAGNOSIS — E119 Type 2 diabetes mellitus without complications: Secondary | ICD-10-CM

## 2018-05-30 ENCOUNTER — Other Ambulatory Visit: Payer: Self-pay | Admitting: Family Medicine

## 2018-05-30 MED ORDER — TRAMADOL HCL 50 MG PO TABS: 50.0000 mg | ORAL_TABLET | Freq: Four times a day (QID) | ORAL | 0 refills | Status: DC | PRN

## 2018-06-26 ENCOUNTER — Other Ambulatory Visit: Payer: Self-pay | Admitting: Family Medicine

## 2018-06-26 DIAGNOSIS — F439 Reaction to severe stress, unspecified: Secondary | ICD-10-CM

## 2018-06-27 ENCOUNTER — Other Ambulatory Visit: Payer: Self-pay | Admitting: Family Medicine

## 2018-07-24 ENCOUNTER — Other Ambulatory Visit: Payer: Self-pay | Admitting: Family Medicine

## 2018-07-24 DIAGNOSIS — G43709 Chronic migraine without aura, not intractable, without status migrainosus: Secondary | ICD-10-CM

## 2018-07-24 MED ORDER — METOPROLOL SUCCINATE ER 50 MG PO TB24: ORAL_TABLET | ORAL | 0 refills | Status: DC

## 2018-07-24 MED ORDER — MIRTAZAPINE 45 MG PO TABS: 45.0000 mg | ORAL_TABLET | Freq: Every day | ORAL | 0 refills | Status: DC

## 2018-07-24 NOTE — Telephone Encounter (Signed)
Patient scheduled with Adriana on 08/01/2018

## 2018-07-24 NOTE — Telephone Encounter (Signed)
[facility] faxed refill request for the following medications:  mirtazapine (REMERON) 45 MG tablet  metoprolol succinate (TOPROL-XL) 50 MG 24 hr tablet   Please advise.

## 2018-07-24 NOTE — Telephone Encounter (Signed)
Rx sent.  Has he been set up to establish with a new PCP?

## 2018-07-25 ENCOUNTER — Emergency Department: Payer: BLUE CROSS/BLUE SHIELD

## 2018-07-25 ENCOUNTER — Other Ambulatory Visit: Payer: Self-pay

## 2018-07-25 ENCOUNTER — Emergency Department
Admission: EM | Admit: 2018-07-25 | Discharge: 2018-07-25 | Disposition: A | Discharge status: Home / Self Care | Payer: BLUE CROSS/BLUE SHIELD | Attending: Emergency Medicine | Admitting: Emergency Medicine

## 2018-07-25 ENCOUNTER — Encounter: Payer: Self-pay | Admitting: Physician Assistant

## 2018-07-25 DIAGNOSIS — Y998 Other external cause status: Secondary | ICD-10-CM | POA: Insufficient documentation

## 2018-07-25 DIAGNOSIS — R0789 Other chest pain: Secondary | ICD-10-CM | POA: Diagnosis not present

## 2018-07-25 DIAGNOSIS — M25532 Pain in left wrist: Secondary | ICD-10-CM | POA: Diagnosis not present

## 2018-07-25 DIAGNOSIS — S299XXA Unspecified injury of thorax, initial encounter: Secondary | ICD-10-CM | POA: Diagnosis not present

## 2018-07-25 DIAGNOSIS — F329 Major depressive disorder, single episode, unspecified: Secondary | ICD-10-CM | POA: Insufficient documentation

## 2018-07-25 DIAGNOSIS — S60222A Contusion of left hand, initial encounter: Secondary | ICD-10-CM | POA: Diagnosis not present

## 2018-07-25 DIAGNOSIS — M7989 Other specified soft tissue disorders: Secondary | ICD-10-CM | POA: Diagnosis not present

## 2018-07-25 DIAGNOSIS — Y9389 Activity, other specified: Secondary | ICD-10-CM | POA: Insufficient documentation

## 2018-07-25 DIAGNOSIS — Z7984 Long term (current) use of oral hypoglycemic drugs: Secondary | ICD-10-CM | POA: Insufficient documentation

## 2018-07-25 DIAGNOSIS — R0781 Pleurodynia: Secondary | ICD-10-CM | POA: Diagnosis not present

## 2018-07-25 DIAGNOSIS — Z79899 Other long term (current) drug therapy: Secondary | ICD-10-CM | POA: Diagnosis not present

## 2018-07-25 DIAGNOSIS — E119 Type 2 diabetes mellitus without complications: Secondary | ICD-10-CM | POA: Insufficient documentation

## 2018-07-25 DIAGNOSIS — S6992XA Unspecified injury of left wrist, hand and finger(s), initial encounter: Secondary | ICD-10-CM | POA: Diagnosis not present

## 2018-07-25 DIAGNOSIS — Y92410 Unspecified street and highway as the place of occurrence of the external cause: Secondary | ICD-10-CM | POA: Diagnosis not present

## 2018-07-25 MED ORDER — KETOROLAC TROMETHAMINE 30 MG/ML IJ SOLN
30.0000 mg | Freq: Once | INTRAMUSCULAR | Status: AC
  Administered 2018-07-25: 30 mg via INTRAMUSCULAR
  Filled 2018-07-25: qty 1

## 2018-07-25 MED ORDER — TRAMADOL HCL 50 MG PO TABS: 50.0000 mg | ORAL_TABLET | Freq: Two times a day (BID) | ORAL | 0 refills | Status: DC

## 2018-07-25 MED ORDER — KETOROLAC TROMETHAMINE 10 MG PO TABS: 10.0000 mg | ORAL_TABLET | Freq: Three times a day (TID) | ORAL | 0 refills | Status: DC

## 2018-07-25 MED ORDER — CYCLOBENZAPRINE HCL 5 MG PO TABS: 5.0000 mg | ORAL_TABLET | Freq: Three times a day (TID) | ORAL | 0 refills | Status: DC | PRN

## 2018-07-25 MED ORDER — TRAMADOL HCL 50 MG PO TABS
50.0000 mg | ORAL_TABLET | Freq: Once | ORAL | Status: AC
  Administered 2018-07-25: 50 mg via ORAL
  Filled 2018-07-25: qty 1
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
## 2018-07-25 NOTE — Discharge Instructions (Signed)
Your exam and x-rays are negative for any acute fractures or dislocation. You may experience ongoing stiffness and soreness for a few days. Apply ice and/or moist heat to any sore muscles and the rib area. Take the prescription meds for pain and inflammation as directed. Follow-up with your provider or return as needed.

## 2018-07-29 ENCOUNTER — Encounter: Payer: Self-pay | Admitting: Family Medicine

## 2018-08-01 ENCOUNTER — Other Ambulatory Visit: Payer: Self-pay

## 2018-08-01 ENCOUNTER — Encounter: Payer: Self-pay | Admitting: Physician Assistant

## 2018-08-01 ENCOUNTER — Ambulatory Visit (INDEPENDENT_AMBULATORY_CARE_PROVIDER_SITE_OTHER): Payer: BLUE CROSS/BLUE SHIELD | Admitting: Physician Assistant

## 2018-08-01 VITALS — BP 118/71 | HR 85 | Temp 98.4°F | Resp 16 | Ht 72.0 in | Wt 182.0 lb

## 2018-08-01 DIAGNOSIS — E291 Testicular hypofunction: Secondary | ICD-10-CM | POA: Diagnosis not present

## 2018-08-01 DIAGNOSIS — E1159 Type 2 diabetes mellitus with other circulatory complications: Secondary | ICD-10-CM

## 2018-08-01 DIAGNOSIS — G4700 Insomnia, unspecified: Secondary | ICD-10-CM | POA: Diagnosis not present

## 2018-08-01 DIAGNOSIS — I10 Essential (primary) hypertension: Secondary | ICD-10-CM

## 2018-08-01 DIAGNOSIS — F3289 Other specified depressive episodes: Secondary | ICD-10-CM

## 2018-08-01 DIAGNOSIS — Z0000 Encounter for general adult medical examination without abnormal findings: Secondary | ICD-10-CM | POA: Diagnosis not present

## 2018-08-01 DIAGNOSIS — E785 Hyperlipidemia, unspecified: Secondary | ICD-10-CM

## 2018-08-01 DIAGNOSIS — E119 Type 2 diabetes mellitus without complications: Secondary | ICD-10-CM | POA: Diagnosis not present

## 2018-08-01 DIAGNOSIS — I152 Hypertension secondary to endocrine disorders: Secondary | ICD-10-CM

## 2018-08-01 DIAGNOSIS — E1142 Type 2 diabetes mellitus with diabetic polyneuropathy: Secondary | ICD-10-CM

## 2018-08-01 DIAGNOSIS — E1169 Type 2 diabetes mellitus with other specified complication: Secondary | ICD-10-CM

## 2018-08-01 MED ORDER — MIRTAZAPINE 30 MG PO TABS: 30.0000 mg | ORAL_TABLET | Freq: Every day | ORAL | 0 refills | Status: DC

## 2018-08-01 MED ORDER — VENLAFAXINE HCL ER 37.5 MG PO CP24: 37.5000 mg | ORAL_CAPSULE | Freq: Every day | ORAL | 0 refills | Status: DC

## 2018-08-01 NOTE — Progress Notes (Signed)
Patient: Billy Hardin, Male    DOB: [birth year], 68 y.o.   MRN: [id number] Visit Date: 08/12/2018  Today's Provider: Trinna Post, PA-C   Chief Complaint  Patient presents with  . Annual Exam   Subjective:     Annual physical exam DUANNE DUCHESNE is a 68 y.o. male who presents today for health maintenance and complete physical. He feels well. He reports exercising none. He reports he is sleeping poorly.  Colonoscopy: 09/28/2016 with diverticulosis and hemorrhoids PSA: 10/03/2016 was 1.0.   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 Grown children from previous marriage, 2. He currently works in a [employer].   He has chronic hearing loss.   DM II: Currently taking metformin 1000 mg BID. Reports he has numbness and tingling in his toes.   Lab Results  Component Value Date   HGBA1C 7.0 (H) 08/01/2018   HLD: currently taking lipitor 20 mg daily without issue.  Lipid Panel     Component Value Date/Time   CHOL 111 08/01/2018 1008   CHOL 165 11/04/2012 2101   TRIG 97 08/01/2018 1008   TRIG 296 (H) 11/04/2012 2101   HDL 49 08/01/2018 1008   HDL 41 11/04/2012 2101   CHOLHDL 2.3 08/01/2018 1008   VLDL 59 (H) 11/04/2012 2101   LDLCALC 43 08/01/2018 1008   LDLCALC 65 11/04/2012 2101   HTN: Currently taking metoprolol succinate 50 mg daily.   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 He takes remeron 45 mg daily for this as well as klonopin 1 mg nightly for sleep. He reports he is not sleeping well with this regimen.  -----------------------------------------------------------------   Review of Systems  HENT: Positive for hearing loss.   Musculoskeletal: Positive for arthralgias.  Psychiatric/Behavioral: Positive for sleep disturbance.  All other systems reviewed and are negative.   Social History      He  reports that  he has never smoked. He has never used smokeless tobacco. He reports current alcohol use. He reports that he does not use drugs.       Social History   Socioeconomic History  . Marital status: Married    Spouse name: Not on file  . Number of children: Not on file  . Years of education: Not on file  . Highest education level: Not on file  Occupational History  . Not on file  Social Needs  . Financial resource strain: Not on file  . Food insecurity:    Worry: Not on file    Inability: Not on file  . Transportation needs:    Medical: Not on file    Non-medical: Not on file  Tobacco Use  . Smoking status: Never Smoker  . Smokeless tobacco: Never Used  Substance and Sexual Activity  . Alcohol use: Yes    Alcohol/week: 0.0 standard drinks    Comment: 1-2 drinks total of 2-3x a year  . Drug use: No  . Sexual activity: Yes    Partners: Female  Lifestyle  . Physical activity:    Days per week: Not on file    Minutes per session: Not on file  . Stress: Not on file  Relationships  . Social connections:    Talks on phone: Not on file    Gets together: Not on file    Attends religious service: Not  on file    Active member of club or organization: Not on file    Attends meetings of clubs or organizations: Not on file    Relationship status: Not on file  Other Topics Concern  . Not on file  Social History Narrative  . Not on file    Past Medical History:  Diagnosis Date  . Chronic migraine without aura without status migrainosus, not intractable 05/30/2016  . Complication of anesthesia    had catheter 4 days unable to void  . Deaf    Rt ear  . Diabetes mellitus without complication ([place])   . Elevated lipids   . Herpes   . Insomnia   . Seasonal allergies   . TMJ (dislocation of temporomandibular joint)   . Uses hearing aid    Left     Patient Active Problem List   Diagnosis Date Noted  . Hypertension associated with diabetes ([place]) 08/12/2018  . Hyperlipidemia  associated with type 2 diabetes mellitus ([place]) 08/12/2018  . History of migraine headaches 04/29/2018  . Chronic fatigue, unspecified 02/08/2017  . Depression 05/30/2016  . Male hypogonadism 05/25/2015  . Genital herpes 05/25/2015  . Situational stress 05/25/2015  . History of kidney stones 05/25/2015  . Diabetes mellitus type 2, uncomplicated ([place]) 03/54/6568  . ASNHL (asymmetrical sensorineural hearing loss) 09/15/2014  . H/O adenomatous polyp of colon 08/07/2013  . Insomnia 01/21/2008  . Family history of cardiovascular disease 02/04/2007    Past Surgical History:  Procedure Laterality Date  . KIDNEY STONE SURGERY    . MIDDLE EAR SURGERY    . PENILE PROSTHESIS IMPLANT N/A 11/03/2014   Procedure: PENILE PROTHESIS INFLATABLE;  Surgeon: Royston Cowper, MD;  Location: [facility] ORS;  Service: Urology;  Laterality: N/A;  . TONSILLECTOMY AND ADENOIDECTOMY      Family History        Family Status  Relation Name Status  . Mother  Deceased at age 5       Cancer  . Father  Deceased at age 9       cancer  . Brother  Alive        His family history includes CAD in his brother; Cancer in his mother.      Allergies  Allergen Reactions  . Bupropion     insomnia  . Sertraline     Ejaculatory dysfunction  . Penicillins Rash     Current Outpatient Medications:  .  aspirin (BAYER ASPIRIN EC LOW DOSE) 81 MG EC tablet, Take 81 mg by mouth daily. Swallow whole., Disp: , Rfl:  .  atorvastatin (LIPITOR) 20 MG tablet, TAKE 1 TABLET BY MOUTH EVERY DAY, Disp: 90 tablet, Rfl: 3 .  cholecalciferol (VITAMIN D) 400 units TABS tablet, Take 400 Units by mouth., Disp: , Rfl:  .  CINNAMON PO, Take 2 capsules by mouth daily., Disp: , Rfl:  .  clonazePAM (KLONOPIN) 1 MG tablet, TAKE 1/2 TO 1 TABLET BY MOUTH EVERY MORNING AND 1 TABLET AT BEDTIME AS NEEDED FOR ANXIETY, Disp: 60 tablet, Rfl: 1 .  cyclobenzaprine (FLEXERIL) 5 MG tablet, Take 1 tablet (5 mg total) by mouth 3 (three) times daily as needed  for muscle spasms., Disp: 15 tablet, Rfl: 0 .  ketorolac (TORADOL) 10 MG tablet, Take 1 tablet (10 mg total) by mouth every 8 (eight) hours., Disp: 15 tablet, Rfl: 0 .  metFORMIN (GLUCOPHAGE) 1000 MG tablet, TAKE 1 TABLET BY MOUTH TWICE DAILY WITH A MEAL, Disp: 180 tablet, Rfl: 1 .  metoprolol succinate (TOPROL-XL) 50 MG 24 hr tablet, TAKE 1 TABLET BY MOUTH EVERY DAY WITH OR IMMEDIATELY FOLLOWING A MEAL, Disp: 90 tablet, Rfl: 0 .  Omega-3 Fatty Acids (OMEGA 3 500 PO), Take by mouth., Disp: , Rfl:  .  omeprazole (PRILOSEC) 40 MG capsule, One daily as needed for heartburn, Disp: 90 capsule, Rfl: 1 .  valACYclovir (VALTREX) 1000 MG tablet, TAKE 1 TABLET(1000 MG) BY MOUTH DAILY, Disp: 90 tablet, Rfl: 3 .  vitamin B-12 (CYANOCOBALAMIN) 1000 MCG tablet, Take 1,000 mcg by mouth daily., Disp: , Rfl:  .  vitamin E 400 UNIT capsule, Take 400 Units by mouth 2 (two) times daily., Disp: , Rfl:  .  blood glucose meter kit and supplies KIT, Dispense based on patient and insurance preference. Check sugar daily, Disp: 1 each, Rfl: 0 .  HYDROcodone-acetaminophen (NORCO/VICODIN) 5-325 MG tablet, One every 4-6 hours as needed for pain (Patient not taking: Reported on 08/01/2018), Disp: 20 tablet, Rfl: 0 .  mirtazapine (REMERON) 30 MG tablet, Take 1 tablet (30 mg total) by mouth at bedtime., Disp: 90 tablet, Rfl: 0 .  ONE TOUCH ULTRA TEST test strip, CHECK BLOOD SUGAR DAILY, Disp: 100 each, Rfl: 3 .  ONETOUCH DELICA LANCETS 76H MISC, TEST BLOOD SUGAR DAILY, Disp: 100 each, Rfl: 3 .  venlafaxine XR (EFFEXOR XR) 37.5 MG 24 hr capsule, Take 1 capsule (37.5 mg total) by mouth daily with breakfast., Disp: 90 capsule, Rfl: 0   Patient Care Team: Paulene Floor as PCP - General (Physician Assistant)    Objective:    Vitals: BP 118/71 (BP Location: Left Arm, Patient Position: Sitting, Cuff Size: Large)   Pulse 85   Temp 98.4 F (36.9 C) (Oral)   Resp 16   Ht 6' (1.829 m)   Wt 182 lb (82.6 kg)   BMI 24.68  kg/m    Vitals:   08/01/18 0912  BP: 118/71  Pulse: 85  Resp: 16  Temp: 98.4 F (36.9 C)  TempSrc: Oral  Weight: 182 lb (82.6 kg)  Height: 6' (1.829 m)     Physical Exam Constitutional:      Appearance: Normal appearance.  HENT:     Head: Normocephalic and atraumatic.     Right Ear: Tympanic membrane, ear canal and external ear normal.     Left Ear: Tympanic membrane, ear canal and external ear normal.     Nose: Nose normal.     Mouth/Throat:     Mouth: Mucous membranes are moist.     Pharynx: Oropharynx is clear.  Eyes:     Extraocular Movements: Extraocular movements intact.     Conjunctiva/sclera: Conjunctivae normal.     Pupils: Pupils are equal, round, and reactive to light.  Neck:     Musculoskeletal: Normal range of motion and neck supple.  Cardiovascular:     Rate and Rhythm: Normal rate and regular rhythm.     Pulses: Normal pulses.     Heart sounds: Normal heart sounds.  Pulmonary:     Effort: Pulmonary effort is normal.     Breath sounds: Normal breath sounds.  Abdominal:     General: Bowel sounds are normal.  Musculoskeletal: Normal range of motion.  Skin:    General: Skin is warm.  Neurological:     Mental Status: He is alert and oriented to person, place, and time.  Psychiatric:        Mood and Affect: Mood normal.        Behavior: Behavior  normal.        Thought Content: Thought content normal.        Judgment: Judgment normal.      Depression Screen PHQ 2/9 Scores 08/01/2018 05/17/2018 06/04/2017 05/26/2015  PHQ - 2 Score 4 2 3  0  PHQ- 9 Score 13 - 12 -       Assessment & Plan:     Routine Health Maintenance and Physical Exam  Exercise Activities and Dietary recommendations Goals   None     Immunization History  Administered Date(s) Administered  . Influenza-Unspecified 02/13/2015, 04/01/2016, 03/16/2017  . Pneumococcal Conjugate-13 05/30/2016  . Pneumococcal Polysaccharide-23 05/26/2015  . Td 06/04/2017  . Tdap 01/16/2007  .  Zoster 06/04/2012    Health Maintenance  Topic Date Due  . Hepatitis C Screening  October 15, 1950  . INFLUENZA VACCINE  12/13/2017  . HEMOGLOBIN A1C  02/01/2019  . OPHTHALMOLOGY EXAM  02/01/2019  . FOOT EXAM  08/01/2019  . URINE MICROALBUMIN  08/01/2019  . PNA vac Low Risk Adult (2 of 2 - PPSV23) 05/25/2020  . COLONOSCOPY  09/29/2026  . TETANUS/TDAP  06/05/2027     Discussed health benefits of physical activity, and encouraged him to engage in regular exercise appropriate for his age and condition.    1. Annual physical exam   2. Male hypogonadism  - Urine Microalbumin w/creat. ratio  3. Type 2 diabetes mellitus without complication, without long-term current use of insulin (HCC)  Controlled, continue current medications. Eye exam UTD. Does have some diabetic neuropathy.  - Lipid panel - CBC with Differential/Platelet - Comprehensive metabolic panel - Hemoglobin A1c  4. Other depression  Depression and insomnia uncontrolled. Will decrease remeron to have moe sedative effect and start effexor for both depression and diabetic neuropathy. Have counseled that I don't typically recommend or prescribe chronic benzodiazepines for insomnia due to risk of addiction, oversedation, falls. It does not seem to be working for him anyhow and we will plan to taper at follow up once the current medication changes have been instituted.  - TSH - mirtazapine (REMERON) 30 MG tablet; Take 1 tablet (30 mg total) by mouth at bedtime.  Dispense: 90 tablet; Refill: 0  6. Insomnia, unspecified type  - mirtazapine (REMERON) 30 MG tablet; Take 1 tablet (30 mg total) by mouth at bedtime.  Dispense: 90 tablet; Refill: 0  7. Hypertension associated with diabetes ([place])   8. Hyperlipidemia associated with type 2 diabetes mellitus ([place])  The entirety of the information documented in the History of Present Illness, Review of Systems and Physical Exam were personally obtained by me. Portions of this  information were initially documented by Lyndel Pleasure, CMA and reviewed by me for thoroughness and accuracy.   Return in about 2 months (around 10/01/2018) for depression and insomnia.      --------------------------------------------------------------------    Trinna Post, PA-C  [place]

## 2018-08-01 NOTE — Patient Instructions (Signed)
Serotonin Syndrome  Serotonin is a chemical in your body (neurotransmitter) that helps to control several functions, such as:  · Brain and nerve cell function.  · Mood and emotions.  · Memory.  · Eating.  · Sleeping.  · Sexual activity.  · Stress response.  Having too much serotonin in your body can cause serotonin syndrome. This condition can be harmful to your brain and nerve cells. This can be a life-threatening condition.  What are the causes?  This condition may be caused by taking medicines or drugs that increase the level of serotonin in your body, such as:  · Antidepressant medicines.  · Migraine medicines.  · Certain pain medicines.  · Certain drugs, including ecstasy, LSD, cocaine, and amphetamines.  · Over-the-counter cough or cold medicines that contain dextromethorphan.  · Certain herbal supplements, including St. John's wort, ginseng, and nutmeg.  This condition usually occurs when you take these medicines or drugs in combination, but it can also happen with a high dose of a single medicine or drug.  What increases the risk?  You are more likely to develop this condition if:  · You just started taking a medicine or drug that increases the level of serotonin in the body.  · You recently increased the dose of a medicine or drug that increases the level of serotonin in the body.  · You take more than one medicine or drug that increases the level of serotonin in the body.  What are the signs or symptoms?  Symptoms of this condition usually start within several hours of taking a medicine or drug. Symptoms may be mild or severe. Mild symptoms include:  · Sweating.  · Restlessness or agitation.  · Muscle twitching or stiffness.  · Rapid heart rate.  · Nausea and vomiting.  · Diarrhea.  · Headache.  · Shivering or goose bumps.  · Confusion.  Severe symptoms include:  · Irregular heartbeat.  · Seizures.  · Loss of consciousness.  · High fever.  How is this diagnosed?  This condition may be diagnosed based  on:  · Your medical history.   · A physical exam.  · Your prior use of drugs and medicines.  · Blood or urine tests. These may be used to rule out other causes of your symptoms.  How is this treated?  The treatment for this condition depends on the severity of your symptoms.  · For mild cases, stopping the medicine or drug that caused your condition is usually all that is needed.  · For moderate to severe cases, treatment in a hospital may be needed to prevent or manage life-threatening symptoms. This may include medicines to control your symptoms, IV fluids, interventions to support your breathing, and treatments to control your body temperature.  Follow these instructions at home:  Medicines    · Take over-the-counter and prescription medicines only as told by your health care provider. This is important.  · Check with your health care provider before you start taking any new prescriptions, over-the-counter medicines, herbs, or supplements.  · Avoid combining any medicines that can cause this condition to occur.  Lifestyle    · Maintain a healthy lifestyle.  ? Eat a healthy diet that includes plenty of vegetables, fruits, whole grains, low-fat dairy products, and lean protein. Do not eat a lot of foods that are high in fat, added sugars, or salt.  ? Get the right amount and quality of sleep. Most adults need 7-9 hours of sleep each night.  ?   Make time to exercise, even if it is only for short periods of time. Most adults should exercise for at least 150 minutes each week.  ? Do not drink alcohol.  ? Do not use illegal drugs, and do not take medicines for reasons other than they are prescribed.  General instructions  · Do not use any products that contain nicotine or tobacco, such as cigarettes and e-cigarettes. If you need help quitting, ask your health care provider.  · Keep all follow-up visits as told by your health care provider. This is important.  Contact a health care provider if:  · Your symptoms do not  improve or they get worse.  Get help right away if you:  · Have worsening confusion, severe headache, chest pain, high fever, seizures, or loss of consciousness.  · Experience serious side effects of medicine, such as swelling of your face, lips, tongue, or throat.  · Have serious thoughts about hurting yourself or others.  These symptoms may represent a serious problem that is an emergency. Do not wait to see if the symptoms will go away. Get medical help right away. Call your local emergency services (911 in the [place] Do not drive yourself to the hospital.  If you ever feel like you may hurt yourself or others, or have thoughts about taking your own life, get help right away. You can go to your nearest emergency department or call:  · Your local emergency services (911 in the [place]  · ·A suicide crisis helpline, such as the National Suicide Prevention Lifeline at 1-800-273-8255. This is open 24 hours a day.  Summary  · Serotonin is a brain chemical that helps to regulate the nervous system. High levels of serotonin in the body can cause serotonin syndrome, which is a very dangerous condition.  · This condition may be caused by taking medicines or drugs that increase the level of serotonin in your body.  · Treatment depends on the severity of your symptoms. For mild cases, stopping the medicine or drug that caused your condition is usually all that is needed.  · Check with your health care provider before you start taking any new prescriptions, over-the-counter medicines, herbs, or supplements.  This information is not intended to replace advice given to you by your health care provider. Make sure you discuss any questions you have with your health care provider.  Document Released: 06/08/2004 Document Revised: 06/08/2017 Document Reviewed: 06/08/2017  Elsevier Interactive Patient Education © 2019 Elsevier Inc.

## 2018-08-02 LAB — CBC WITH DIFFERENTIAL/PLATELET
Basophils Absolute: 0 10*3/uL (ref 0.0–0.2)
Basos: 1 %
EOS (ABSOLUTE): 0.2 10*3/uL (ref 0.0–0.4)
Eos: 4 %
Hematocrit: 36.1 % — ABNORMAL LOW (ref 37.5–51.0)
Hemoglobin: 12.7 g/dL — ABNORMAL LOW (ref 13.0–17.7)
Immature Grans (Abs): 0 10*3/uL (ref 0.0–0.1)
Immature Granulocytes: 0 %
Lymphocytes Absolute: 1.4 10*3/uL (ref 0.7–3.1)
Lymphs: 26 %
MCH: 32.6 pg (ref 26.6–33.0)
MCHC: 35.2 g/dL (ref 31.5–35.7)
MCV: 93 fL (ref 79–97)
Monocytes Absolute: 0.5 10*3/uL (ref 0.1–0.9)
Monocytes: 10 %
Neutrophils Absolute: 3.2 10*3/uL (ref 1.4–7.0)
Neutrophils: 59 %
Platelets: 199 10*3/uL (ref 150–450)
RBC: 3.89 x10E6/uL — ABNORMAL LOW (ref 4.14–5.80)
RDW: 13.5 % (ref 11.6–15.4)
WBC: 5.5 10*3/uL (ref 3.4–10.8)

## 2018-08-02 LAB — COMPREHENSIVE METABOLIC PANEL
ALT: 10 IU/L (ref 0–44)
AST: 12 IU/L (ref 0–40)
Albumin/Globulin Ratio: 2.2 (ref 1.2–2.2)
Albumin: 4.2 g/dL (ref 3.8–4.8)
Alkaline Phosphatase: 64 IU/L (ref 39–117)
BUN/Creatinine Ratio: 15 (ref 10–24)
BUN: 15 mg/dL (ref 8–27)
Bilirubin Total: 0.3 mg/dL (ref 0.0–1.2)
CO2: 20 mmol/L (ref 20–29)
Calcium: 9.3 mg/dL (ref 8.6–10.2)
Chloride: 106 mmol/L (ref 96–106)
Creatinine, Ser: 1 mg/dL (ref 0.76–1.27)
GFR calc Af Amer: 90 mL/min/{1.73_m2} (ref >59)
GFR calc non Af Amer: 78 mL/min/{1.73_m2} (ref >59)
Globulin, Total: 1.9 g/dL (ref 1.5–4.5)
Glucose: 122 mg/dL — ABNORMAL HIGH (ref 65–99)
Potassium: 4.5 mmol/L (ref 3.5–5.2)
Sodium: 141 mmol/L (ref 134–144)
Total Protein: 6.1 g/dL (ref 6.0–8.5)

## 2018-08-02 LAB — MICROALBUMIN / CREATININE URINE RATIO
Creatinine, Urine: 154.9 mg/dL
Microalb/Creat Ratio: 7 mg/g creat (ref 0–29)
Microalbumin, Urine: 11.5 ug/mL

## 2018-08-02 LAB — HEMOGLOBIN A1C
Est. average glucose Bld gHb Est-mCnc: 154 mg/dL
Hgb A1c MFr Bld: 7 % — ABNORMAL HIGH (ref 4.8–5.6)

## 2018-08-02 LAB — LIPID PANEL
Chol/HDL Ratio: 2.3 ratio (ref 0.0–5.0)
Cholesterol, Total: 111 mg/dL (ref 100–199)
HDL: 49 mg/dL (ref >39)
LDL Calculated: 43 mg/dL (ref 0–99)
Triglycerides: 97 mg/dL (ref 0–149)
VLDL Cholesterol Cal: 19 mg/dL (ref 5–40)

## 2018-08-02 LAB — TSH: TSH: 1.27 u[IU]/mL (ref 0.450–4.500)

## 2018-08-06 ENCOUNTER — Telehealth: Payer: Self-pay

## 2018-08-06 NOTE — Telephone Encounter (Signed)
Can you results his labs.

## 2018-08-06 NOTE — Telephone Encounter (Signed)
Resulted

## 2018-08-07 NOTE — Telephone Encounter (Signed)
Patient advised as directed below.Health screening form fax to [phone]

## 2018-08-07 NOTE — Telephone Encounter (Signed)
-----   Message from Trinna Post, [place] sent at 08/06/2018  4:40 PM EDT ----- Urine micro normal. Cholesterol well controlled. A1c slightly elevated but still controlled. Remaining labs stable.

## 2018-08-12 DIAGNOSIS — E1159 Type 2 diabetes mellitus with other circulatory complications: Secondary | ICD-10-CM | POA: Insufficient documentation

## 2018-08-12 DIAGNOSIS — E1169 Type 2 diabetes mellitus with other specified complication: Secondary | ICD-10-CM | POA: Insufficient documentation

## 2018-08-12 DIAGNOSIS — I10 Essential (primary) hypertension: Secondary | ICD-10-CM

## 2018-08-12 DIAGNOSIS — E785 Hyperlipidemia, unspecified: Secondary | ICD-10-CM

## 2018-08-12 DIAGNOSIS — I152 Hypertension secondary to endocrine disorders: Secondary | ICD-10-CM | POA: Insufficient documentation

## 2018-08-30 ENCOUNTER — Ambulatory Visit: Payer: Self-pay | Admitting: Physician Assistant

## 2018-09-03 ENCOUNTER — Ambulatory Visit (INDEPENDENT_AMBULATORY_CARE_PROVIDER_SITE_OTHER): Payer: BLUE CROSS/BLUE SHIELD | Admitting: Physician Assistant

## 2018-09-03 DIAGNOSIS — F3289 Other specified depressive episodes: Secondary | ICD-10-CM | POA: Diagnosis not present

## 2018-09-03 DIAGNOSIS — M5441 Lumbago with sciatica, right side: Secondary | ICD-10-CM | POA: Diagnosis not present

## 2018-09-03 DIAGNOSIS — G4700 Insomnia, unspecified: Secondary | ICD-10-CM | POA: Diagnosis not present

## 2018-09-03 DIAGNOSIS — G8929 Other chronic pain: Secondary | ICD-10-CM | POA: Diagnosis not present

## 2018-09-03 DIAGNOSIS — M25551 Pain in right hip: Secondary | ICD-10-CM | POA: Diagnosis not present

## 2018-09-03 DIAGNOSIS — E119 Type 2 diabetes mellitus without complications: Secondary | ICD-10-CM | POA: Diagnosis not present

## 2018-09-03 MED ORDER — CLONAZEPAM 0.5 MG PO TABS: 0.7500 mg | ORAL_TABLET | Freq: Every day | ORAL | 0 refills | Status: DC

## 2018-09-03 NOTE — Progress Notes (Signed)
Patient: Billy Hardin Male    DOB: October 31, 1950   68 y.o.   MRN: [id number] Visit Date: 09/03/2018  Today's Provider: Trinna Post, PA-C   Chief Complaint  Patient presents with  . Depression  . Insomnia   Subjective:    I, Billy Hardin CMA, am acting as a Education administrator for [employer], PA-C.   Virtual Visit via Video Note  I connected with Billy Hardin on 09/03/18 at  2:00 PM EDT by a video enabled telemedicine application and verified that I am speaking with the correct person using two identifiers.   I discussed the limitations of evaluation and management by telemedicine and the availability of in person appointments. The patient expressed understanding and agreed to proceed.  Vitals were not obtained during this visit. Patient declined depression screening. HPI  Depression, Follow-up  [name] last seen for this 1 monthsago. Changes made at last visit include: decrease Remeron from 45 mg to 30 mg daily and started Effexor 37.5 mg daily Hereports (excellent)compliance with treatment. He (is not)having side effects  [place]ports (good)tolerance of treatment. Current symptoms include: none     Insomnia Follow up  He presents today for follow up of insomnia. He was last seen for this 1 months ago. Management changes included none at last visit but plan to taper off medication at follow-up. He is not having adverse reaction from treatment.  Insomnia is getting better. He does not have difficulty FALLING asleep. He does not have difficulty STAYING asleep.  He is not taking OTC sleeping aid. [name] He is taking medications to help sleep. He is not drinking alcohol to help sleep. He is not using illicit drugs.  He reports that the changes in medications have helped him sleep better and have helped to even out his mood. He is falling asleep better and is having less night time awakenings. Patient has also been taking 1 mg clonazepam nightly for at least  one year for insomnia. He is willing to decrease this medication.  --------------------------------------------------------------------     Allergies  Allergen Reactions  . Bupropion     insomnia  . Sertraline     Ejaculatory dysfunction  . Penicillins Rash     Current Outpatient Medications:  .  aspirin (BAYER ASPIRIN EC LOW DOSE) 81 MG EC tablet, Take 81 mg by mouth daily. Swallow whole., Disp: , Rfl:  .  atorvastatin (LIPITOR) 20 MG tablet, TAKE 1 TABLET BY MOUTH EVERY DAY, Disp: 90 tablet, Rfl: 3 .  blood glucose meter kit and supplies KIT, Dispense based on patient and insurance preference. Check sugar daily, Disp: 1 each, Rfl: 0 .  cholecalciferol (VITAMIN D) 400 units TABS tablet, Take 400 Units by mouth., Disp: , Rfl:  .  CINNAMON PO, Take 2 capsules by mouth daily., Disp: , Rfl:  .  clonazePAM (KLONOPIN) 1 MG tablet, TAKE 1/2 TO 1 TABLET BY MOUTH EVERY MORNING AND 1 TABLET AT BEDTIME AS NEEDED FOR ANXIETY, Disp: 60 tablet, Rfl: 1 .  cyclobenzaprine (FLEXERIL) 5 MG tablet, Take 1 tablet (5 mg total) by mouth 3 (three) times daily as needed for muscle spasms., Disp: 15 tablet, Rfl: 0 .  HYDROcodone-acetaminophen (NORCO/VICODIN) 5-325 MG tablet, One every 4-6 hours as needed for pain, Disp: 20 tablet, Rfl: 0 .  ketorolac (TORADOL) 10 MG tablet, Take 1 tablet (10 mg total) by mouth every 8 (eight) hours., Disp: 15 tablet, Rfl: 0 .  metFORMIN (GLUCOPHAGE) 1000 MG tablet, TAKE 1  TABLET BY MOUTH TWICE DAILY WITH A MEAL, Disp: 180 tablet, Rfl: 1 .  metoprolol succinate (TOPROL-XL) 50 MG 24 hr tablet, TAKE 1 TABLET BY MOUTH EVERY DAY WITH OR IMMEDIATELY FOLLOWING A MEAL, Disp: 90 tablet, Rfl: 0 .  mirtazapine (REMERON) 30 MG tablet, Take 1 tablet (30 mg total) by mouth at bedtime., Disp: 90 tablet, Rfl: 0 .  Omega-3 Fatty Acids (OMEGA 3 500 PO), Take by mouth., Disp: , Rfl:  .  omeprazole (PRILOSEC) 40 MG capsule, One daily as needed for heartburn, Disp: 90 capsule, Rfl: 1 .  ONE TOUCH  ULTRA TEST test strip, CHECK BLOOD SUGAR DAILY, Disp: 100 each, Rfl: 3 .  ONETOUCH DELICA LANCETS 69I MISC, TEST BLOOD SUGAR DAILY, Disp: 100 each, Rfl: 3 .  valACYclovir (VALTREX) 1000 MG tablet, TAKE 1 TABLET(1000 MG) BY MOUTH DAILY, Disp: 90 tablet, Rfl: 3 .  venlafaxine XR (EFFEXOR XR) 37.5 MG 24 hr capsule, Take 1 capsule (37.5 mg total) by mouth daily with breakfast., Disp: 90 capsule, Rfl: 0 .  vitamin B-12 (CYANOCOBALAMIN) 1000 MCG tablet, Take 1,000 mcg by mouth daily., Disp: , Rfl:  .  vitamin E 400 UNIT capsule, Take 400 Units by mouth 2 (two) times daily., Disp: , Rfl:   Review of Systems  Social History   Tobacco Use  . Smoking status: Never Smoker  . Smokeless tobacco: Never Used  Substance Use Topics  . Alcohol use: Yes    Alcohol/week: 0.0 standard drinks    Comment: 1-2 drinks total of 2-3x a year      Objective:   There were no vitals taken for this visit. There were no vitals filed for this visit.   Physical Exam      Assessment & Plan    1. Insomnia, unspecified type  We will continue remeron at reduced dose of 30 mg nightly and also effexor. We will start to taper klonopin. I have explained that he cannot stop this medication cold [place] as he will experience potentially dangerous withdrawal. We will taper by 0.25 mg per month. Advised patient he may experience increase in anxiety or sleeplessness when coming off this and we can adjust taper accordingly in addition to his other medications. Follow up in one month for insomnia and depression.   - clonazePAM (KLONOPIN) 0.5 MG tablet; Take 1.5 tablets (0.75 mg total) by mouth at bedtime for 30 days.  Dispense: 45 tablet; Refill: 0  2. Other depression  - clonazePAM (KLONOPIN) 0.5 MG tablet; Take 1.5 tablets (0.75 mg total) by mouth at bedtime for 30 days.  Dispense: 45 tablet; Refill: 0  The entirety of the information documented in the History of Present Illness, Review of Systems and Physical Exam were  personally obtained by me. Portions of this information were initially documented by [facility], CMA and reviewed by me for thoroughness and accuracy.    Patient location: home Provider location: [place] office  Persons involved in the visit: patient, provider   Interactive audio and video communications were attempted, although failed due to patient's inability to connect to video. Continued visit with audio only interaction with patient agreement.     Trinna Post, PA-C  [facility]

## 2018-09-04 NOTE — Patient Instructions (Signed)

## 2018-10-06 ENCOUNTER — Encounter: Payer: Self-pay | Admitting: Physician Assistant

## 2018-10-08 ENCOUNTER — Telehealth: Payer: Self-pay | Admitting: Physician Assistant

## 2018-10-08 DIAGNOSIS — F3289 Other specified depressive episodes: Secondary | ICD-10-CM

## 2018-10-08 DIAGNOSIS — G4700 Insomnia, unspecified: Secondary | ICD-10-CM

## 2018-10-08 MED ORDER — CLONAZEPAM 0.5 MG PO TABS: 0.7500 mg | ORAL_TABLET | Freq: Every day | ORAL | 0 refills | Status: DC

## 2018-10-08 NOTE — Telephone Encounter (Signed)
[id number] 

## 2018-10-08 NOTE — Telephone Encounter (Signed)
Hi can we reach out to this patient and set up virtual visit, it's ok if it's telephone. He was supposed to have one month follow up thank you.

## 2018-10-09 NOTE — Telephone Encounter (Signed)
[id number] 

## 2018-10-15 NOTE — Telephone Encounter (Signed)
lmtcb

## 2018-10-18 NOTE — Telephone Encounter (Signed)
Patient advised as below.  

## 2018-10-20 ENCOUNTER — Other Ambulatory Visit: Payer: Self-pay | Admitting: Family Medicine

## 2018-10-20 DIAGNOSIS — G43709 Chronic migraine without aura, not intractable, without status migrainosus: Secondary | ICD-10-CM

## 2018-10-21 ENCOUNTER — Other Ambulatory Visit: Payer: Self-pay | Admitting: Physician Assistant

## 2018-10-21 NOTE — Telephone Encounter (Signed)
[facility] faxed refill request for the following medications:  atorvastatin (LIPITOR) 20 MG tablet  valACYclovir (VALTREX) 1000 MG tablet   Please advise.  Thanks, [employer]

## 2018-10-22 ENCOUNTER — Ambulatory Visit (INDEPENDENT_AMBULATORY_CARE_PROVIDER_SITE_OTHER): Payer: BC Managed Care – PPO | Admitting: Physician Assistant

## 2018-10-22 ENCOUNTER — Other Ambulatory Visit: Payer: Self-pay

## 2018-10-22 DIAGNOSIS — G4700 Insomnia, unspecified: Secondary | ICD-10-CM

## 2018-10-22 DIAGNOSIS — F3289 Other specified depressive episodes: Secondary | ICD-10-CM

## 2018-10-22 MED ORDER — VENLAFAXINE HCL ER 37.5 MG PO CP24: 37.5000 mg | ORAL_CAPSULE | Freq: Every day | ORAL | 0 refills | Status: DC

## 2018-10-22 MED ORDER — ATORVASTATIN CALCIUM 20 MG PO TABS: 20.0000 mg | ORAL_TABLET | Freq: Every day | ORAL | 3 refills | Status: DC

## 2018-10-22 MED ORDER — VALACYCLOVIR HCL 1 G PO TABS: ORAL_TABLET | ORAL | 3 refills | Status: DC

## 2018-10-22 NOTE — Progress Notes (Signed)
Subjective:    Patient ID: Billy Hardin, male    DOB: April 01, 1951, 68 y.o.   MRN: [id number]  Billy Hardin is a 68 y.o. male presenting on 10/22/2018 for Insomnia and Depression  Virtual Visit via Telephone Note  I connected with Billy Hardin on 10/22/18 at  8:20 AM EDT by telephone and verified that I am speaking with the correct person using two identifiers.   I discussed the limitations, risks, security and privacy concerns of performing an evaluation and management service by telephone and the availability of in person appointments. I also discussed with the patient that there may be a patient responsible charge related to this service. The patient expressed understanding and agreed to proceed.   Patient location: home Provider location: [place] office  Persons involved in the visit: patient, provider   HPI   Reports he is having trouble staying asleep. Decreased Remeron from 45 mg to 30 mg daily, added effexor 37.5 mg daily for depression. Reduced klonopin from 1 mg nightly to 0.75 mg nightly. He is currently taking 0.5 mg Klonopin nightly. Having trouble with sleep currently.   Prefers to call cell phone [phone] Social History   Tobacco Use  . Smoking status: Never Smoker  . Smokeless tobacco: Never Used  Substance Use Topics  . Alcohol use: Yes    Alcohol/week: 0.0 standard drinks    Comment: 1-2 drinks total of 2-3x a year  . Drug use: No    Review of Systems Per HPI unless specifically indicated above     Objective:    There were no vitals taken for this visit.  Wt Readings from Last 3 Encounters:  08/01/18 182 lb (82.6 kg)  05/17/18 182 lb 12.8 oz (82.9 kg)  04/29/18 177 lb 12.8 oz (80.6 kg)    Physical Exam Results for orders placed or performed in visit on 08/01/18  Lipid panel  Result Value Ref Range   Cholesterol, Total 111 100 - 199 mg/dL   Triglycerides 97 0 - 149 mg/dL   HDL 49 >39 mg/dL   VLDL Cholesterol Cal  19 5 - 40 mg/dL   LDL Calculated 43 0 - 99 mg/dL   Chol/HDL Ratio 2.3 0.0 - 5.0 ratio  CBC with Differential/Platelet  Result Value Ref Range   WBC 5.5 3.4 - 10.8 x10E3/uL   RBC 3.89 (L) 4.14 - 5.80 x10E6/uL   Hemoglobin 12.7 (L) 13.0 - 17.7 g/dL   Hematocrit 36.1 (L) 37.5 - 51.0 %   MCV 93 79 - 97 fL   MCH 32.6 26.6 - 33.0 pg   MCHC 35.2 31.5 - 35.7 g/dL   RDW 13.5 11.6 - 15.4 %   Platelets 199 150 - 450 x10E3/uL   Neutrophils 59 Not Estab. %   Lymphs 26 Not Estab. %   Monocytes 10 Not Estab. %   Eos 4 Not Estab. %   Basos 1 Not Estab. %   Neutrophils Absolute 3.2 1.4 - 7.0 x10E3/uL   Lymphocytes Absolute 1.4 0.7 - 3.1 x10E3/uL   Monocytes Absolute 0.5 0.1 - 0.9 x10E3/uL   EOS (ABSOLUTE) 0.2 0.0 - 0.4 x10E3/uL   Basophils Absolute 0.0 0.0 - 0.2 x10E3/uL   Immature Granulocytes 0 Not Estab. %   Immature Grans (Abs) 0.0 0.0 - 0.1 x10E3/uL  Comprehensive metabolic panel  Result Value Ref Range   Glucose 122 (H) 65 - 99 mg/dL   BUN 15 8 - 27 mg/dL   Creatinine, Ser 1.00 0.76 -  1.27 mg/dL   GFR calc non Af Amer 78 >59 mL/min/1.73   GFR calc Af Amer 90 >59 mL/min/1.73   BUN/Creatinine Ratio 15 10 - 24   Sodium 141 134 - 144 mmol/L   Potassium 4.5 3.5 - 5.2 mmol/L   Chloride 106 96 - 106 mmol/L   CO2 20 20 - 29 mmol/L   Calcium 9.3 8.6 - 10.2 mg/dL   Total Protein 6.1 6.0 - 8.5 g/dL   Albumin 4.2 3.8 - 4.8 g/dL   Globulin, Total 1.9 1.5 - 4.5 g/dL   Albumin/Globulin Ratio 2.2 1.2 - 2.2   Bilirubin Total 0.3 0.0 - 1.2 mg/dL   Alkaline Phosphatase 64 39 - 117 IU/L   AST 12 0 - 40 IU/L   ALT 10 0 - 44 IU/L  TSH  Result Value Ref Range   TSH 1.270 0.450 - 4.500 uIU/mL  Hemoglobin A1c  Result Value Ref Range   Hgb A1c MFr Bld 7.0 (H) 4.8 - 5.6 %   Est. average glucose Bld gHb Est-mCnc 154 mg/dL  Urine Microalbumin w/creat. ratio  Result Value Ref Range   Creatinine, Urine 154.9 Not Estab. mg/dL   Microalbumin, Urine 11.5 Not Estab. ug/mL   Microalb/Creat Ratio 7 0 - 29  mg/g creat      Assessment & Plan:  1. Other depression  - venlafaxine XR (EFFEXOR XR) 37.5 MG 24 hr capsule; Take 1 capsule (37.5 mg total) by mouth daily with breakfast.  Dispense: 90 capsule; Refill: 0  2. Insomnia, unspecified type  Will add trazodone, counseled on serotonin syndrome. Continue klonopin at 0.75 mg nightly, he had dropped down to 0.5mg  nightly.   - traZODone (DESYREL) 50 MG tablet; Take 0.5-1 tablets (25-50 mg total) by mouth at bedtime as needed for sleep.  Dispense: 30 tablet; Refill: 3   Follow up plan: No follow-ups on file.  Billy Collet, PA-C [facility] 11/08/2018, 5:43 PM

## 2018-10-24 ENCOUNTER — Telehealth: Payer: Self-pay | Admitting: Physician Assistant

## 2018-10-24 MED ORDER — TRAZODONE HCL 50 MG PO TABS: 25.0000 mg | ORAL_TABLET | Freq: Every evening | ORAL | 3 refills | Status: DC | PRN

## 2018-10-24 NOTE — Telephone Encounter (Signed)
Please let patient know I have sent him in sleep medication called trazadone. He can start with a half pill an hour before bed at night. Rarely this can cause an interaction with his medications called serotonin syndrome. He should watch out for fevers, chills, sweating, fast heart beat and diarrhea. Can schedule one month follow up.

## 2018-10-24 NOTE — Telephone Encounter (Signed)
Patient was advised.  

## 2018-11-08 NOTE — Patient Instructions (Signed)

## 2018-11-18 ENCOUNTER — Other Ambulatory Visit: Payer: Self-pay | Admitting: Physician Assistant

## 2018-11-18 DIAGNOSIS — F3289 Other specified depressive episodes: Secondary | ICD-10-CM

## 2018-11-18 DIAGNOSIS — G4700 Insomnia, unspecified: Secondary | ICD-10-CM

## 2018-11-20 ENCOUNTER — Telehealth: Payer: Self-pay | Admitting: Physician Assistant

## 2018-11-20 DIAGNOSIS — E119 Type 2 diabetes mellitus without complications: Secondary | ICD-10-CM

## 2018-11-20 NOTE — Telephone Encounter (Signed)
[facility] faxed refill request for the following medications:   metFORMIN (GLUCOPHAGE) 1000 MG tablet     Please advise.  

## 2018-11-21 DIAGNOSIS — M7551 Bursitis of right shoulder: Secondary | ICD-10-CM | POA: Diagnosis not present

## 2018-11-21 DIAGNOSIS — E119 Type 2 diabetes mellitus without complications: Secondary | ICD-10-CM | POA: Diagnosis not present

## 2018-11-21 DIAGNOSIS — G8929 Other chronic pain: Secondary | ICD-10-CM | POA: Diagnosis not present

## 2018-11-21 DIAGNOSIS — M25511 Pain in right shoulder: Secondary | ICD-10-CM | POA: Diagnosis not present

## 2018-11-21 MED ORDER — METFORMIN HCL 1000 MG PO TABS: ORAL_TABLET | ORAL | 1 refills | Status: DC

## 2018-12-17 ENCOUNTER — Ambulatory Visit (INDEPENDENT_AMBULATORY_CARE_PROVIDER_SITE_OTHER): Payer: BC Managed Care – PPO | Admitting: Physician Assistant

## 2018-12-17 ENCOUNTER — Other Ambulatory Visit: Payer: Self-pay

## 2018-12-17 ENCOUNTER — Encounter: Payer: Self-pay | Admitting: Physician Assistant

## 2018-12-17 VITALS — BP 141/80 | HR 77 | Temp 98.6°F | Resp 16 | Wt 179.0 lb

## 2018-12-17 DIAGNOSIS — F439 Reaction to severe stress, unspecified: Secondary | ICD-10-CM | POA: Diagnosis not present

## 2018-12-17 DIAGNOSIS — E1169 Type 2 diabetes mellitus with other specified complication: Secondary | ICD-10-CM

## 2018-12-17 DIAGNOSIS — E1159 Type 2 diabetes mellitus with other circulatory complications: Secondary | ICD-10-CM | POA: Diagnosis not present

## 2018-12-17 DIAGNOSIS — G4700 Insomnia, unspecified: Secondary | ICD-10-CM | POA: Diagnosis not present

## 2018-12-17 DIAGNOSIS — E785 Hyperlipidemia, unspecified: Secondary | ICD-10-CM

## 2018-12-17 DIAGNOSIS — E119 Type 2 diabetes mellitus without complications: Secondary | ICD-10-CM | POA: Diagnosis not present

## 2018-12-17 DIAGNOSIS — I10 Essential (primary) hypertension: Secondary | ICD-10-CM

## 2018-12-17 LAB — POCT GLYCOSYLATED HEMOGLOBIN (HGB A1C): Hemoglobin A1C: 7.1 % — AB (ref 4.0–5.6)

## 2018-12-17 NOTE — Patient Instructions (Signed)
Diabetes Mellitus and Exercise Exercising regularly is important for your overall health, especially when you have diabetes (diabetes mellitus). Exercising is not only about losing weight. It has many other health benefits, such as increasing muscle strength and bone density and reducing body fat and stress. This leads to improved fitness, flexibility, and endurance, all of which result in better overall health. Exercise has additional benefits for people with diabetes, including:  Reducing appetite.  Helping to lower and control blood glucose.  Lowering blood pressure.  Helping to control amounts of fatty substances (lipids) in the blood, such as cholesterol and triglycerides.  Helping the body to respond better to insulin (improving insulin sensitivity).  Reducing how much insulin the body needs.  Decreasing the risk for heart disease by: ? Lowering cholesterol and triglyceride levels. ? Increasing the levels of good cholesterol. ? Lowering blood glucose levels. What is my activity plan? Your health care provider or certified diabetes educator can help you make a plan for the type and frequency of exercise (activity plan) that works for you. Make sure that you:  Do at least 150 minutes of moderate-intensity or vigorous-intensity exercise each week. This could be brisk walking, biking, or water aerobics. ? Do stretching and strength exercises, such as yoga or weightlifting, at least 2 times a week. ? Spread out your activity over at least 3 days of the week.  Get some form of physical activity every day. ? Do not go more than 2 days in a row without some kind of physical activity. ? Avoid being inactive for more than 30 minutes at a time. Take frequent breaks to walk or stretch.  Choose a type of exercise or activity that you enjoy, and set realistic goals.  Start slowly, and gradually increase the intensity of your exercise over time. What do I need to know about managing my  diabetes?   Check your blood glucose before and after exercising. ? If your blood glucose is 240 mg/dL (13.3 mmol/L) or higher before you exercise, check your urine for ketones. If you have ketones in your urine, do not exercise until your blood glucose returns to normal. ? If your blood glucose is 100 mg/dL (5.6 mmol/L) or lower, eat a snack containing 15-20 grams of carbohydrate. Check your blood glucose 15 minutes after the snack to make sure that your level is above 100 mg/dL (5.6 mmol/L) before you start your exercise.  Know the symptoms of low blood glucose (hypoglycemia) and how to treat it. Your risk for hypoglycemia increases during and after exercise. Common symptoms of hypoglycemia can include: ? Hunger. ? Anxiety. ? Sweating and feeling clammy. ? Confusion. ? Dizziness or feeling light-headed. ? Increased heart rate or palpitations. ? Blurry vision. ? Tingling or numbness around the mouth, lips, or tongue. ? Tremors or shakes. ? Irritability.  Keep a rapid-acting carbohydrate snack available before, during, and after exercise to help prevent or treat hypoglycemia.  Avoid injecting insulin into areas of the body that are going to be exercised. For example, avoid injecting insulin into: ? The arms, when playing tennis. ? The legs, when jogging.  Keep records of your exercise habits. Doing this can help you and your health care provider adjust your diabetes management plan as needed. Write down: ? Food that you eat before and after you exercise. ? Blood glucose levels before and after you exercise. ? The type and amount of exercise you have done. ? When your insulin is expected to peak, if you use   insulin. Avoid exercising at times when your insulin is peaking.  When you start a new exercise or activity, work with your health care provider to make sure the activity is safe for you, and to adjust your insulin, medicines, or food intake as needed.  Drink plenty of water while  you exercise to prevent dehydration or heat stroke. Drink enough fluid to keep your urine clear or pale yellow. Summary  Exercising regularly is important for your overall health, especially when you have diabetes (diabetes mellitus).  Exercising has many health benefits, such as increasing muscle strength and bone density and reducing body fat and stress.  Your health care provider or certified diabetes educator can help you make a plan for the type and frequency of exercise (activity plan) that works for you.  When you start a new exercise or activity, work with your health care provider to make sure the activity is safe for you, and to adjust your insulin, medicines, or food intake as needed. This information is not intended to replace advice given to you by your health care provider. Make sure you discuss any questions you have with your health care provider. Document Released: 07/22/2003 Document Revised: 11/23/2016 Document Reviewed: 10/11/2015 Elsevier Patient Education  2020 Elsevier Inc.  

## 2018-12-17 NOTE — Progress Notes (Signed)
Patient: Billy Hardin Male    DOB: [birth year]   68 y.o.   MRN: [id number] Visit Date: 12/17/2018  Today's Provider: Trinna Post, PA-C   Chief Complaint  Patient presents with  . Diabetes   Subjective:     HPI  Follow up for diabetes  The patient was last seen for this 5 months ago. Changes made at last visit include no changes.  He reports excellent compliance with treatment. He feels that condition is Unchanged. He is not having side effects.   Anxiety and Depression  He continues with his klonopin taper. He is tapering down from 0.75 mg nightly to 0.5 mg nightly. He is taking trazodone for sleep. He is taking remeron 30 mg daily and effexor 37.5 mg daily.   Has been furloughed from his job at [employer], may lose insurance.  ------------------------------------------------------------------------------------   Allergies  Allergen Reactions  . Bupropion     insomnia  . Sertraline     Ejaculatory dysfunction  . Penicillins Rash     Current Outpatient Medications:  .  aspirin (BAYER ASPIRIN EC LOW DOSE) 81 MG EC tablet, Take 81 mg by mouth daily. Swallow whole., Disp: , Rfl:  .  atorvastatin (LIPITOR) 20 MG tablet, Take 1 tablet (20 mg total) by mouth daily., Disp: 90 tablet, Rfl: 3 .  blood glucose meter kit and supplies KIT, Dispense based on patient and insurance preference. Check sugar daily, Disp: 1 each, Rfl: 0 .  cholecalciferol (VITAMIN D) 400 units TABS tablet, Take 400 Units by mouth., Disp: , Rfl:  .  CINNAMON PO, Take 2 capsules by mouth daily., Disp: , Rfl:  .  clonazePAM (KLONOPIN) 0.5 MG tablet, TAKE 1 AND 1/2 TABLETS(0.75 MG) BY MOUTH AT BEDTIME, Disp: 45 tablet, Rfl: 0 .  cyclobenzaprine (FLEXERIL) 5 MG tablet, Take 1 tablet (5 mg total) by mouth 3 (three) times daily as needed for muscle spasms., Disp: 15 tablet, Rfl: 0 .  HYDROcodone-acetaminophen (NORCO/VICODIN) 5-325 MG tablet, One every 4-6 hours as needed for pain, Disp: 20 tablet,  Rfl: 0 .  ketorolac (TORADOL) 10 MG tablet, Take 1 tablet (10 mg total) by mouth every 8 (eight) hours., Disp: 15 tablet, Rfl: 0 .  meloxicam (MOBIC) 15 MG tablet, Take by mouth., Disp: , Rfl:  .  metFORMIN (GLUCOPHAGE) 1000 MG tablet, TAKE 1 TABLET BY MOUTH TWICE DAILY WITH A MEAL, Disp: 180 tablet, Rfl: 1 .  metoprolol succinate (TOPROL-XL) 50 MG 24 hr tablet, TAKE 1 TABLET BY MOUTH EVERY DAY WITH OR IMMEDIATELY FOLLOWING A MEAL, Disp: 90 tablet, Rfl: 0 .  mirtazapine (REMERON) 30 MG tablet, Take 1 tablet (30 mg total) by mouth at bedtime., Disp: 90 tablet, Rfl: 0 .  Omega-3 Fatty Acids (OMEGA 3 500 PO), Take by mouth., Disp: , Rfl:  .  omeprazole (PRILOSEC) 40 MG capsule, One daily as needed for heartburn, Disp: 90 capsule, Rfl: 1 .  ONE TOUCH ULTRA TEST test strip, CHECK BLOOD SUGAR DAILY, Disp: 100 each, Rfl: 3 .  ONETOUCH DELICA LANCETS 35W MISC, TEST BLOOD SUGAR DAILY, Disp: 100 each, Rfl: 3 .  traZODone (DESYREL) 50 MG tablet, Take 0.5-1 tablets (25-50 mg total) by mouth at bedtime as needed for sleep., Disp: 30 tablet, Rfl: 3 .  valACYclovir (VALTREX) 1000 MG tablet, TAKE 1 TABLET(1000 MG) BY MOUTH DAILY, Disp: 90 tablet, Rfl: 3 .  venlafaxine XR (EFFEXOR XR) 37.5 MG 24 hr capsule, Take 1 capsule (37.5 mg total) by  mouth daily with breakfast., Disp: 90 capsule, Rfl: 0 .  vitamin B-12 (CYANOCOBALAMIN) 1000 MCG tablet, Take 1,000 mcg by mouth daily., Disp: , Rfl:  .  vitamin E 400 UNIT capsule, Take 400 Units by mouth 2 (two) times daily., Disp: , Rfl:   Review of Systems  Constitutional: Negative.   Respiratory: Negative.   Cardiovascular: Negative.   Endocrine: Negative.   Neurological: Positive for numbness.    Social History   Tobacco Use  . Smoking status: Never Smoker  . Smokeless tobacco: Never Used  Substance Use Topics  . Alcohol use: Yes    Alcohol/week: 0.0 standard drinks    Comment: 1-2 drinks total of 2-3x a year      Objective:   BP (!) 141/80 (BP Location:  Left Arm, Patient Position: Sitting, Cuff Size: Normal)   Pulse 77   Temp 98.6 F (37 C) (Oral)   Resp 16   Wt 179 lb (81.2 kg)   BMI 24.28 kg/m  Vitals:   12/17/18 1100  BP: (!) 141/80  Pulse: 77  Resp: 16  Temp: 98.6 F (37 C)  TempSrc: Oral  Weight: 179 lb (81.2 kg)     Physical Exam Constitutional:      Appearance: Normal appearance.  Cardiovascular:     Rate and Rhythm: Normal rate and regular rhythm.     Heart sounds: Normal heart sounds.  Pulmonary:     Effort: Pulmonary effort is normal.     Breath sounds: Normal breath sounds.  Skin:    General: Skin is warm and dry.  Neurological:     Mental Status: He is alert and oriented to person, place, and time. Mental status is at baseline.  Psychiatric:        Mood and Affect: Mood normal.        Behavior: Behavior normal.      No results found for any visits on 12/17/18.     Assessment & Plan    1. Type 2 diabetes mellitus without complication, without long-term current use of insulin (HCC)  A1c is 7.1% which is still controlled though has been gradually increasing. If it increases higher will need to adjust medication.   - POCT glycosylated hemoglobin (Hb A1C)  2. Insomnia, unspecified type  Continue trazadone. Continue to observe for serotonin syndrome.  3. Situational stress  Continue current medications.   4. Hypertension associated with diabetes ([place])  Slightly elevated today, continue current medication.  5. Hyperlipidemia associated with type 2 diabetes mellitus (HCC)  Continue Lipitor 10 mg nightly.   F/u 4-6 months.  The entirety of the information documented in the History of Present Illness, Review of Systems and Physical Exam were personally obtained by me. Portions of this information were initially documented by Lynford Humphrey, CMA and reviewed by me for thoroughness and accuracy.      Trinna Post, PA-C  [facility]

## 2018-12-30 ENCOUNTER — Telehealth: Payer: Self-pay | Admitting: Physician Assistant

## 2018-12-30 DIAGNOSIS — G4700 Insomnia, unspecified: Secondary | ICD-10-CM

## 2018-12-30 MED ORDER — TRAZODONE HCL 50 MG PO TABS: 25.0000 mg | ORAL_TABLET | Freq: Every evening | ORAL | 3 refills | Status: DC | PRN

## 2018-12-30 NOTE — Telephone Encounter (Signed)
Refilled

## 2018-12-30 NOTE — Telephone Encounter (Signed)
Please review

## 2018-12-30 NOTE — Telephone Encounter (Signed)
Pt needs a refill on Trazodone 50 mg 90 days  [facility]  CB#  [phone]  [place]

## 2019-01-19 ENCOUNTER — Other Ambulatory Visit: Payer: Self-pay | Admitting: Physician Assistant

## 2019-01-19 DIAGNOSIS — F3289 Other specified depressive episodes: Secondary | ICD-10-CM

## 2019-01-19 DIAGNOSIS — G43709 Chronic migraine without aura, not intractable, without status migrainosus: Secondary | ICD-10-CM

## 2019-02-05 ENCOUNTER — Ambulatory Visit (INDEPENDENT_AMBULATORY_CARE_PROVIDER_SITE_OTHER): Payer: BC Managed Care – PPO

## 2019-02-05 ENCOUNTER — Other Ambulatory Visit: Payer: Self-pay

## 2019-02-05 DIAGNOSIS — Z23 Encounter for immunization: Secondary | ICD-10-CM

## 2019-02-07 LAB — HM DIABETES EYE EXAM

## 2019-02-10 ENCOUNTER — Other Ambulatory Visit: Payer: Self-pay | Admitting: Physician Assistant

## 2019-02-10 DIAGNOSIS — F3289 Other specified depressive episodes: Secondary | ICD-10-CM

## 2019-02-10 DIAGNOSIS — G4700 Insomnia, unspecified: Secondary | ICD-10-CM

## 2019-02-10 NOTE — Telephone Encounter (Signed)
[facility] faxed refill request for the following medications:  mirtazapine (REMERON) 30 MG tablet   90 day supply LOV: 12/17/2018 Please advise. Thanks TNP

## 2019-02-11 MED ORDER — MIRTAZAPINE 30 MG PO TABS: 30.0000 mg | ORAL_TABLET | Freq: Every day | ORAL | 0 refills | Status: DC

## 2019-02-13 ENCOUNTER — Encounter: Payer: Self-pay | Admitting: Physician Assistant

## 2019-04-15 DIAGNOSIS — G8929 Other chronic pain: Secondary | ICD-10-CM | POA: Diagnosis not present

## 2019-04-15 DIAGNOSIS — M25311 Other instability, right shoulder: Secondary | ICD-10-CM | POA: Diagnosis not present

## 2019-04-15 DIAGNOSIS — M25511 Pain in right shoulder: Secondary | ICD-10-CM | POA: Diagnosis not present

## 2019-04-15 DIAGNOSIS — E119 Type 2 diabetes mellitus without complications: Secondary | ICD-10-CM | POA: Diagnosis not present

## 2019-04-15 DIAGNOSIS — M7551 Bursitis of right shoulder: Secondary | ICD-10-CM | POA: Diagnosis not present

## 2019-04-16 DIAGNOSIS — M65811 Other synovitis and tenosynovitis, right shoulder: Secondary | ICD-10-CM | POA: Diagnosis not present

## 2019-04-16 DIAGNOSIS — S46011A Strain of muscle(s) and tendon(s) of the rotator cuff of right shoulder, initial encounter: Secondary | ICD-10-CM | POA: Diagnosis not present

## 2019-04-16 DIAGNOSIS — M19011 Primary osteoarthritis, right shoulder: Secondary | ICD-10-CM | POA: Diagnosis not present

## 2019-04-16 DIAGNOSIS — M67911 Unspecified disorder of synovium and tendon, right shoulder: Secondary | ICD-10-CM | POA: Diagnosis not present

## 2019-04-21 ENCOUNTER — Other Ambulatory Visit: Payer: Self-pay | Admitting: Physician Assistant

## 2019-04-21 DIAGNOSIS — G43709 Chronic migraine without aura, not intractable, without status migrainosus: Secondary | ICD-10-CM

## 2019-04-21 DIAGNOSIS — F3289 Other specified depressive episodes: Secondary | ICD-10-CM

## 2019-04-24 DIAGNOSIS — M25311 Other instability, right shoulder: Secondary | ICD-10-CM | POA: Diagnosis not present

## 2019-04-27 ENCOUNTER — Other Ambulatory Visit: Payer: Self-pay | Admitting: Physician Assistant

## 2019-04-27 DIAGNOSIS — G4700 Insomnia, unspecified: Secondary | ICD-10-CM

## 2019-04-28 ENCOUNTER — Other Ambulatory Visit: Payer: Self-pay | Admitting: Orthopedic Surgery

## 2019-04-28 ENCOUNTER — Other Ambulatory Visit: Payer: Self-pay

## 2019-04-28 ENCOUNTER — Encounter: Payer: Self-pay | Admitting: Orthopedic Surgery

## 2019-04-29 ENCOUNTER — Other Ambulatory Visit
Admission: RE | Admit: 2019-04-29 | Discharge: 2019-04-29 | Disposition: A | Discharge status: Home / Self Care | Payer: BC Managed Care – PPO | Source: Ambulatory Visit | Attending: Orthopedic Surgery | Admitting: Orthopedic Surgery

## 2019-04-29 DIAGNOSIS — Z20828 Contact with and (suspected) exposure to other viral communicable diseases: Secondary | ICD-10-CM | POA: Diagnosis not present

## 2019-04-29 DIAGNOSIS — Z01812 Encounter for preprocedural laboratory examination: Secondary | ICD-10-CM | POA: Diagnosis not present

## 2019-04-29 LAB — SARS CORONAVIRUS 2 (TAT 6-24 HRS): SARS Coronavirus 2: NEGATIVE

## 2019-05-02 ENCOUNTER — Encounter
Admission: RE | Disposition: A | Discharge status: Home / Self Care | Payer: Self-pay | Source: Home / Self Care | Attending: Orthopedic Surgery

## 2019-05-02 ENCOUNTER — Ambulatory Visit: Payer: BC Managed Care – PPO | Admitting: Anesthesiology

## 2019-05-02 ENCOUNTER — Encounter: Payer: Self-pay | Admitting: Orthopedic Surgery

## 2019-05-02 ENCOUNTER — Ambulatory Visit
Admission: RE | Admit: 2019-05-02 | Discharge: 2019-05-02 | Disposition: A | Discharge status: Home / Self Care | Payer: BC Managed Care – PPO | Attending: Orthopedic Surgery | Admitting: Orthopedic Surgery

## 2019-05-02 ENCOUNTER — Other Ambulatory Visit: Payer: Self-pay

## 2019-05-02 DIAGNOSIS — E119 Type 2 diabetes mellitus without complications: Secondary | ICD-10-CM | POA: Diagnosis not present

## 2019-05-02 DIAGNOSIS — M25811 Other specified joint disorders, right shoulder: Secondary | ICD-10-CM | POA: Diagnosis not present

## 2019-05-02 DIAGNOSIS — X58XXXA Exposure to other specified factors, initial encounter: Secondary | ICD-10-CM | POA: Diagnosis not present

## 2019-05-02 DIAGNOSIS — Z7982 Long term (current) use of aspirin: Secondary | ICD-10-CM | POA: Diagnosis not present

## 2019-05-02 DIAGNOSIS — M75121 Complete rotator cuff tear or rupture of right shoulder, not specified as traumatic: Secondary | ICD-10-CM | POA: Diagnosis not present

## 2019-05-02 DIAGNOSIS — I10 Essential (primary) hypertension: Secondary | ICD-10-CM | POA: Diagnosis not present

## 2019-05-02 DIAGNOSIS — S46211A Strain of muscle, fascia and tendon of other parts of biceps, right arm, initial encounter: Secondary | ICD-10-CM | POA: Insufficient documentation

## 2019-05-02 DIAGNOSIS — Z7984 Long term (current) use of oral hypoglycemic drugs: Secondary | ICD-10-CM | POA: Insufficient documentation

## 2019-05-02 DIAGNOSIS — M19011 Primary osteoarthritis, right shoulder: Secondary | ICD-10-CM | POA: Diagnosis not present

## 2019-05-02 DIAGNOSIS — M7521 Bicipital tendinitis, right shoulder: Secondary | ICD-10-CM | POA: Insufficient documentation

## 2019-05-02 DIAGNOSIS — M7541 Impingement syndrome of right shoulder: Secondary | ICD-10-CM | POA: Diagnosis not present

## 2019-05-02 DIAGNOSIS — M25511 Pain in right shoulder: Secondary | ICD-10-CM | POA: Diagnosis not present

## 2019-05-02 DIAGNOSIS — G8918 Other acute postprocedural pain: Secondary | ICD-10-CM | POA: Diagnosis not present

## 2019-05-02 DIAGNOSIS — Z791 Long term (current) use of non-steroidal anti-inflammatories (NSAID): Secondary | ICD-10-CM | POA: Insufficient documentation

## 2019-05-02 DIAGNOSIS — M75101 Unspecified rotator cuff tear or rupture of right shoulder, not specified as traumatic: Secondary | ICD-10-CM | POA: Insufficient documentation

## 2019-05-02 DIAGNOSIS — M65811 Other synovitis and tenosynovitis, right shoulder: Secondary | ICD-10-CM | POA: Diagnosis not present

## 2019-05-02 DIAGNOSIS — M75111 Incomplete rotator cuff tear or rupture of right shoulder, not specified as traumatic: Secondary | ICD-10-CM | POA: Diagnosis not present

## 2019-05-02 DIAGNOSIS — Z79899 Other long term (current) drug therapy: Secondary | ICD-10-CM | POA: Insufficient documentation

## 2019-05-02 DIAGNOSIS — S43431A Superior glenoid labrum lesion of right shoulder, initial encounter: Secondary | ICD-10-CM | POA: Insufficient documentation

## 2019-05-02 LAB — GLUCOSE, CAPILLARY
Glucose-Capillary: 141 mg/dL — ABNORMAL HIGH (ref 70–99)
Glucose-Capillary: 161 mg/dL — ABNORMAL HIGH (ref 70–99)

## 2019-05-02 SURGERY — SHOULDER ARTHROSCOPY WITH SUBACROMIAL DECOMPRESSION AND DISTAL CLAVICLE EXCISION
Anesthesia: General | Site: Shoulder | Laterality: Right

## 2019-05-02 MED ORDER — EPHEDRINE SULFATE 50 MG/ML IJ SOLN
INTRAMUSCULAR | Status: DC | PRN
  Administered 2019-05-02: 10 mg via INTRAVENOUS
  Administered 2019-05-02 (×2): 5 mg via INTRAVENOUS

## 2019-05-02 MED ORDER — DEXAMETHASONE SODIUM PHOSPHATE 4 MG/ML IJ SOLN
INTRAMUSCULAR | Status: DC | PRN
  Administered 2019-05-02: 4 mg via INTRAVENOUS

## 2019-05-02 MED ORDER — ONDANSETRON HCL 4 MG/2ML IJ SOLN
INTRAMUSCULAR | Status: DC | PRN
  Administered 2019-05-02: 4 mg via INTRAVENOUS

## 2019-05-02 MED ORDER — LACTATED RINGERS IV SOLN
INTRAVENOUS | Status: DC | PRN
  Administered 2019-05-02: 08:00:00 4 mL

## 2019-05-02 MED ORDER — CHLORHEXIDINE GLUCONATE 4 % EX LIQD: 60.0000 mL | Freq: Once | CUTANEOUS | Status: DC

## 2019-05-02 MED ORDER — BUPIVACAINE HCL (PF) 0.5 % IJ SOLN: INTRAMUSCULAR | Status: DC | PRN

## 2019-05-02 MED ORDER — LACTATED RINGERS IV SOLN: INTRAVENOUS | Status: DC

## 2019-05-02 MED ORDER — OXYCODONE HCL 5 MG PO TABS: 5.0000 mg | ORAL_TABLET | Freq: Once | ORAL | Status: DC | PRN

## 2019-05-02 MED ORDER — OXYCODONE HCL 5 MG/5ML PO SOLN: 5.0000 mg | Freq: Once | ORAL | Status: DC | PRN

## 2019-05-02 MED ORDER — ACETAMINOPHEN 500 MG PO TABS: 1000.0000 mg | ORAL_TABLET | Freq: Three times a day (TID) | ORAL | 2 refills | Status: AC

## 2019-05-02 MED ORDER — MIDAZOLAM HCL 5 MG/5ML IJ SOLN
INTRAMUSCULAR | Status: DC | PRN
  Administered 2019-05-02: 2 mg via INTRAVENOUS

## 2019-05-02 MED ORDER — GLYCOPYRROLATE 0.2 MG/ML IJ SOLN
INTRAMUSCULAR | Status: DC | PRN
  Administered 2019-05-02: .1 mg via INTRAVENOUS

## 2019-05-02 MED ORDER — FENTANYL CITRATE (PF) 100 MCG/2ML IJ SOLN
INTRAMUSCULAR | Status: DC | PRN
  Administered 2019-05-02: 50 ug via INTRAVENOUS

## 2019-05-02 MED ORDER — FENTANYL CITRATE (PF) 100 MCG/2ML IJ SOLN: 25.0000 ug | INTRAMUSCULAR | Status: DC | PRN

## 2019-05-02 MED ORDER — ONDANSETRON 4 MG PO TBDP: 4.0000 mg | ORAL_TABLET | Freq: Three times a day (TID) | ORAL | 0 refills | Status: DC | PRN

## 2019-05-02 MED ORDER — PROPOFOL 10 MG/ML IV BOLUS
INTRAVENOUS | Status: DC | PRN
  Administered 2019-05-02: 180 mg via INTRAVENOUS

## 2019-05-02 MED ORDER — OXYCODONE HCL 5 MG PO TABS: 5.0000 mg | ORAL_TABLET | ORAL | 0 refills | Status: DC | PRN

## 2019-05-02 MED ORDER — ASPIRIN EC 325 MG PO TBEC: 325.0000 mg | DELAYED_RELEASE_TABLET | Freq: Every day | ORAL | 0 refills | Status: AC

## 2019-05-02 MED ORDER — BUPIVACAINE LIPOSOME 1.3 % IJ SUSP
INTRAMUSCULAR | Status: DC | PRN
  Administered 2019-05-02: 266 mg via PERINEURAL

## 2019-05-02 MED ORDER — BUPIVACAINE HCL (PF) 0.5 % IJ SOLN
INTRAMUSCULAR | Status: DC | PRN
  Administered 2019-05-02: 100 mg via PERINEURAL

## 2019-05-02 MED ORDER — CLINDAMYCIN PHOSPHATE 900 MG/50ML IV SOLN
900.0000 mg | INTRAVENOUS | Status: AC
  Administered 2019-05-02: 900 mg via INTRAVENOUS

## 2019-05-02 MED ORDER — LIDOCAINE HCL (CARDIAC) PF 100 MG/5ML IV SOSY
PREFILLED_SYRINGE | INTRAVENOUS | Status: DC | PRN
  Administered 2019-05-02: 20 mg via INTRATRACHEAL

## 2019-05-02 MED ORDER — ONDANSETRON HCL 4 MG/2ML IJ SOLN: 4.0000 mg | Freq: Once | INTRAMUSCULAR | Status: DC | PRN

## 2019-05-02 SURGICAL SUPPLY — 53 items
ADAPTER IRRIG TUBE 2 SPIKE SOL (ADAPTER) ×6 IMPLANT
BUR BR 5.5 12 FLUTE (BURR) ×3 IMPLANT
BUR RADIUS 4.0X18.5 (BURR) ×3 IMPLANT
CANNULA 5.75X7CM (CANNULA) ×1
CANNULA PART THRD DISP 5.75X7 (CANNULA) ×2 IMPLANT
CHLORAPREP W/TINT 26 (MISCELLANEOUS) ×3 IMPLANT
COOLER POLAR GLACIER W/PUMP (MISCELLANEOUS) ×3 IMPLANT
COVER LIGHT HANDLE UNIVERSAL (MISCELLANEOUS) ×6 IMPLANT
COVER WAND RF STERILE (DRAPES) ×2 IMPLANT
DRAPE IMP U-DRAPE 54X76 (DRAPES) ×6 IMPLANT
DRAPE INCISE IOBAN 66X45 STRL (DRAPES) ×3 IMPLANT
DRAPE SHEET LG 3/4 BI-LAMINATE (DRAPES) ×3 IMPLANT
DRAPE U-SHAPE 48X52 POLY STRL (PACKS) ×4 IMPLANT
DRSG TEGADERM 4X4.75 (GAUZE/BANDAGES/DRESSINGS) ×9 IMPLANT
ELECT REM PT RETURN 9FT ADLT (ELECTROSURGICAL) ×3
ELECTRODE REM PT RTRN 9FT ADLT (ELECTROSURGICAL) ×1 IMPLANT
GAUZE XEROFORM 1X8 LF (GAUZE/BANDAGES/DRESSINGS) ×3 IMPLANT
GLOVE BIO SURGEON STRL SZ7.5 (GLOVE) ×6 IMPLANT
GLOVE BIOGEL PI IND STRL 8 (GLOVE) ×1 IMPLANT
GLOVE BIOGEL PI INDICATOR 8 (GLOVE) ×4
GOWN STRL REIN 2XL XLG LVL4 (GOWN DISPOSABLE) ×3 IMPLANT
GOWN STRL REUS W/ TWL LRG LVL3 (GOWN DISPOSABLE) ×1 IMPLANT
GOWN STRL REUS W/TWL LRG LVL3 (GOWN DISPOSABLE) ×2
IMP SYSTEM BRIDGE 4.75X19.1 (Anchor) ×3 IMPLANT
IMPL SYSTEM BRIDGE 4.75X19.1 (Anchor) IMPLANT
IV LACTATED RINGER IRRG 3000ML (IV SOLUTION) ×20
IV LR IRRIG 3000ML ARTHROMATIC (IV SOLUTION) ×8 IMPLANT
KIT STABILIZATION SHOULDER (MISCELLANEOUS) ×3 IMPLANT
KIT TURNOVER KIT A (KITS) ×3 IMPLANT
MANIFOLD NEPTUNE II (INSTRUMENTS) ×3 IMPLANT
MASK FACE SPIDER DISP (MASK) ×3 IMPLANT
MAT GRAY ABSORB FLUID 28X50 (MISCELLANEOUS) ×6 IMPLANT
NDL SAFETY ECLIPSE 18X1.5 (NEEDLE) ×1 IMPLANT
NDL SCORPION MULTI FIRE (NEEDLE) IMPLANT
NEEDLE HYPO 18GX1.5 SHARP (NEEDLE) ×2
NEEDLE SCORPION MULTI FIRE (NEEDLE) ×3 IMPLANT
PACK ARTHROSCOPY SHOULDER (MISCELLANEOUS) ×3 IMPLANT
PAD WRAPON POLAR SHDR UNIV (MISCELLANEOUS) ×1 IMPLANT
PENCIL SMOKE EVACUATOR (MISCELLANEOUS) ×2 IMPLANT
SET TUBE SUCT SHAVER OUTFL 24K (TUBING) ×3 IMPLANT
SET TUBE TIP INTRA-ARTICULAR (MISCELLANEOUS) ×3 IMPLANT
SPONGE GAUZE 2X2 8PLY STER LF (GAUZE/BANDAGES/DRESSINGS) ×3
SPONGE GAUZE 2X2 8PLY STRL LF (GAUZE/BANDAGES/DRESSINGS) ×6 IMPLANT
STAPLER SKIN PROX 35W (STAPLE) ×2 IMPLANT
SUT ETHILON 3-0 FS-10 30 BLK (SUTURE) ×3
SUT VIC AB 0 CT1 36 (SUTURE) ×3 IMPLANT
SUT VIC AB 2-0 CT2 27 (SUTURE) ×3 IMPLANT
SUTURE EHLN 3-0 FS-10 30 BLK (SUTURE) ×1 IMPLANT
SYSTEM FBRTK BICEPS 1.9 DRILL (Anchor) ×2 IMPLANT
TAPE MICROFOAM 4IN (TAPE) ×3 IMPLANT
TUBING ARTHRO INFLOW-ONLY STRL (TUBING) ×3 IMPLANT
WAND WEREWOLF FLOW 90D (MISCELLANEOUS) ×3 IMPLANT
WRAPON POLAR PAD SHDR UNIV (MISCELLANEOUS) ×3

## 2019-05-02 NOTE — Discharge Instructions (Signed)
Information for Discharge Teaching: EXPAREL (bupivacaine liposome injectable suspension)   Your surgeon or anesthesiologist gave you EXPAREL(bupivacaine) to help control your pain after surgery.   EXPAREL is a local anesthetic that provides pain relief by numbing the tissue around the surgical site.  EXPAREL is designed to release pain medication over time and can control pain for up to 72 hours.  Depending on how you respond to EXPAREL, you may require less pain medication during your recovery.  Possible side effects:  Temporary loss of sensation or ability to move in the area where bupivacaine was injected.  Nausea, vomiting, constipation  Rarely, numbness and tingling in your mouth or lips, lightheadedness, or anxiety may occur.  Call your doctor right away if you think you may be experiencing any of these sensations, or if you have other questions regarding possible side effects.  Follow all other discharge instructions given to you by your surgeon or nurse. Eat a healthy diet and drink plenty of water or other fluids.  If you return to the hospital for any reason within 96 hours following the administration of EXPAREL, it is important for health care providers to know that you have received this anesthetic. A teal colored band has been placed on your arm with the date, time and amount of EXPAREL you have received in order to alert and inform your health care providers. Please leave this armband in place for the full 96 hours following administration, and then you may remove the band.Post-Op Instructions - Rotator Cuff Repair  1. Bracing: You will wear a shoulder immobilizer or sling for 6 weeks.   2. Driving: No driving for 3 weeks post-op. When driving, do not wear the immobilizer. Ideally, we recommend no driving for 6 weeks while sling is in place as one arm will be immobilized.   3. Activity: No active lifting for 2 months. Wrist, hand, and elbow motion only. Avoid lifting the  upper arm away from the body except for hygiene. You are permitted to bend and straighten the elbow passively only (no active elbow motion). You may use your hand and wrist for typing, writing, and managing utensils (cutting food). Do not lift more than a coffee cup for 8 weeks.  When sleeping or resting, inclined positions (recliner chair or wedge pillow) and a pillow under the forearm for support may provide better comfort for up to 4 weeks.  Avoid long distance travel for 4 weeks.  Return to normal activities after rotator cuff repair repair normally takes 6 months on average. If rehab goes very well, may be able to do most activities at 4 months, except overhead or contact sports.  4. Physical Therapy: Begins 3-4 days after surgery, and proceed 1 time per week for the first 6 weeks, then 1-2 times per week from weeks 6-20 post-op.  5. Medications:  - You will be provided a prescription for narcotic pain medicine. After surgery, take 1-2 narcotic tablets every 4 hours if needed for severe pain.  - A prescription for anti-nausea medication will be provided in case the narcotic medicine causes nausea - take 1 tablet every 6 hours only if nauseated.   - Take tylenol 1000 mg (2 Extra Strength tablets or 3 regular strength) every 8 hours for pain.  May decrease or stop tylenol 5 days after surgery if you are having minimal pain. - Take ASA 325mg /day x 2 weeks to help prevent DVTs/PEs (blood clots).  - DO NOT take ANY nonsteroidal anti-inflammatory pain medications (Advil, Motrin,  Ibuprofen, Aleve, Naproxen, or Naprosyn). These medicines can inhibit healing of your shoulder repair.    If you are taking prescription medication for anxiety, depression, insomnia, muscle spasm, chronic pain, or for attention deficit disorder, you are advised that you are at a higher risk of adverse effects with use of narcotics post-op, including narcotic addiction/dependence, depressed breathing, death. If you use  non-prescribed substances: alcohol, marijuana, cocaine, heroin, methamphetamines, etc., you are at a higher risk of adverse effects with use of narcotics post-op, including narcotic addiction/dependence, depressed breathing, death. You are advised that taking > 50 morphine milligram equivalents (MME) of narcotic pain medication per day results in twice the risk of overdose or death. For your prescription provided: oxycodone 5 mg - taking more than 6 tablets per day would result in > 50 morphine milligram equivalents (MME) of narcotic pain medication. Be advised that we will prescribe narcotics short-term, for acute post-operative pain only - 3 weeks for major operations such as shoulder repair/reconstruction surgeries.     6. Post-Op Appointment:  Your first post-op appointment will be 10-14 days post-op.  7. Work or School: For most, but not all procedures, we advise staying out of work or school for at least 1 to 2 weeks in order to recover from the stress of surgery and to allow time for healing.   If you need a work or school note this can be provided.   8. Smoking: If you are a smoker, you need to refrain from smoking in the postoperative period. The nicotine in cigarettes will inhibit healing of your shoulder repair and decrease the chance of successful repair. Similarly, nicotine containing products (gum, patches) should be avoided.   Post-operative Brace: Apply and remove the brace you received as you were instructed to at the time of fitting and as described in detail as the braces instructions for use indicate.  Wear the brace for the period of time prescribed by your physician.  The brace can be cleaned with soap and water and allowed to air dry only.  Should the brace result in increased pain, decreased feeling (numbness/tingling), increased swelling or an overall worsening of your medical condition, please contact your doctor immediately.  If an emergency situation occurs as a result  of wearing the brace after normal business hours, please dial 911 and seek immediate medical attention.  Let your doctor know if you have any further questions about the brace issued to you. Refer to the shoulder sling instructions for use if you have any questions regarding the correct fit of your shoulder sling.  [facility] for Troubleshooting: [phone]  Video that illustrates how to properly use a shoulder sling: "Instructions for Proper Use of an Orthopaedic Sling" [email or web address]        General Anesthesia, Adult, Care After This sheet gives you information about how to care for yourself after your procedure. Your health care provider may also give you more specific instructions. If you have problems or questions, contact your health care provider. What can I expect after the procedure? After the procedure, the following side effects are common:  Pain or discomfort at the IV site.  Nausea.  Vomiting.  Sore throat.  Trouble concentrating.  Feeling cold or chills.  Weak or tired.  Sleepiness and fatigue.  Soreness and body aches. These side effects can affect parts of the body that were not involved in surgery. Follow these instructions at home:  For at least 24 hours after the procedure:  Have a responsible  adult stay with you. It is important to have someone help care for you until you are awake and alert.  Rest as needed.  Do not: ? Participate in activities in which you could fall or become injured. ? Drive. ? Use heavy machinery. ? Drink alcohol. ? Take sleeping pills or medicines that cause drowsiness. ? Make important decisions or sign legal documents. ? Take care of children on your own. Eating and drinking  Follow any instructions from your health care provider about eating or drinking restrictions.  When you feel hungry, start by eating small amounts of foods that are soft and easy to digest (bland), such as  toast. Gradually return to your regular diet.  Drink enough fluid to keep your urine pale yellow.  If you vomit, rehydrate by drinking water, juice, or clear broth. General instructions  If you have sleep apnea, surgery and certain medicines can increase your risk for breathing problems. Follow instructions from your health care provider about wearing your sleep device: ? Anytime you are sleeping, including during daytime naps. ? While taking prescription pain medicines, sleeping medicines, or medicines that make you drowsy.  Return to your normal activities as told by your health care provider. Ask your health care provider what activities are safe for you.  Take over-the-counter and prescription medicines only as told by your health care provider.  If you smoke, do not smoke without supervision.  Keep all follow-up visits as told by your health care provider. This is important. Contact a health care provider if:  You have nausea or vomiting that does not get better with medicine.  You cannot eat or drink without vomiting.  You have pain that does not get better with medicine.  You are unable to pass urine.  You develop a skin rash.  You have a fever.  You have redness around your IV site that gets worse. Get help right away if:  You have difficulty breathing.  You have chest pain.  You have blood in your urine or stool, or you vomit blood. Summary  After the procedure, it is common to have a sore throat or nausea. It is also common to feel tired.  Have a responsible adult stay with you for the first 24 hours after general anesthesia. It is important to have someone help care for you until you are awake and alert.  When you feel hungry, start by eating small amounts of foods that are soft and easy to digest (bland), such as toast. Gradually return to your regular diet.  Drink enough fluid to keep your urine pale yellow.  Return to your normal activities as told by  your health care provider. Ask your health care provider what activities are safe for you. This information is not intended to replace advice given to you by your health care provider. Make sure you discuss any questions you have with your health care provider. Document Released: 08/07/2000 Document Revised: 05/04/2017 Document Reviewed: 12/15/2016 Elsevier Patient Education  2020 [employer].

## 2019-05-02 NOTE — Progress Notes (Signed)
[facility] with right, ultrasound guided, interscalene  block. Side rails up, monitors on throughout procedure. See vital signs in flow sheet. Tolerated Procedure well.

## 2019-05-02 NOTE — Op Note (Signed)
SURGERY DATE:05/02/2019    PRE-OP DIAGNOSIS:  1. Right subacromial impingement 2. Right  proximal biceps rupture 3. Right  full-thickness rotator cuff tear 4. Right acromioclavicular joint osteoarthritis  5. Right biceps tendinopathy   POST-OP DIAGNOSIS: 1. Right subacromial impingement 2. Right  proximal biceps rupture 3. Right  full-thickness rotator cuff tear 4. Right acromioclavicular joint osteoarthritis  5. Right biceps tendinopathy   PROCEDURES:  1. Right mini-open rotator cuff repair 2. Right open biceps tenodesis 3. Right arthroscopic distal clavicle excision 4. Right arthroscopic extensive debridement of shoulder (glenohumeral and subacromial spaces) 5. Right arthroscopic subacromial decompression   SURGEON: Billy Mulligan, MD  ASSISTANT: Anitra Lauth, PA   ANESTHESIA: Gen with Exparil interscalene block   ESTIMATED BLOOD LOSS: 25cc   DRAINS:  none   TOTAL IV FLUIDS: per anesthesia      SPECIMENS: none   IMPLANTS:  - Arthrex 4.75m SwiveLock x 4 (SpeedBridge Kit) - Arthrex 1.835mKnotless FiberTak   OPERATIVE FINDINGS:  Examination under anesthesia: A careful examination under anesthesia was performed.  Passive range of motion was: FF: 150; ER at side: 45; ER in abduction: 90; IR in abduction: 50.  Anterior load shift: NT.  Posterior load shift: NT.  Sulcus in neutral: NT.  Sulcus in ER: NT.     Intra-operative findings: A thorough arthroscopic examination of the shoulder was performed.  The findings are: 1. Biceps tendon: proximal tendon thickening with small split tear 2. Superior labrum: injected with surrounding synovitis with type II SLAP tear 3. Posterior labrum and capsule: normal 4. Inferior capsule and inferior recess: normal 5. Glenoid cartilage surface: Grade 1 changes with mild surface fibrillation posteriorly 6. Supraspinatus attachment: full-thickness tear 7. Posterior rotator cuff attachment: normal 8. Humeral head articular cartilage:  normal 9. Rotator interval: significant synovitis 10: Subscapularis tendon:  Normal 11. Anterior labrum: degenerative 12. IGHL: normal   OPERATIVE REPORT:    Indications for procedure:Billy Hardin a [age].o. male with chronic shoulder pain for at least 6 months.  Has failed non-operative management including activity modification, physical therapy, and medical management. Clinical exam and MRI were suggestive of  full-thickness rotator cuff tear, proximal biceps tendinopathy, subacromial impingement, and acromioclavicular joint arthritis. After discussion of risks, benefits, and alternatives to surgery, the patient elected to proceed.    Procedure in detail:   I identified Billy Hardin the pre-operative holding area.  I marked the operative shoulder with my initials. I reviewed the risks and benefits of the proposed surgical intervention, and the patient (and/or patient's guardian) wished to proceed.  Anesthesia was then performed with an Exparil interscalene block.  The patient was transferred to the operative suite and placed in the beach chair position.     SCDs were placed on the lower extremities. Appropriate IV antibiotics were administered prior to incision. The operative upper extremity was then prepped and draped in standard fashion. A time out was performed confirming the correct extremity, correct patient, and correct procedure.    I then created a standard posterior portal with an 11 blade. The glenohumeral joint was easily entered with a blunt trochar and the arthroscope introduced. The findings of diagnostic arthroscopy are described above. I debrided synovitic tissue about the rotator interval and anterior and superior labrum. I then coagulated the inflamed synovium to obtain hemostasis and reduce the risk of post-operative swelling using an Arthrocare radiofrequency device.  Next, I used arthroscopic scissors to cut the proximal biceps tendon at its insertion on  the  superior labrum.  A shaver was used to debride the stump down.   Next, the arthroscope was then introduced into the subacromial space. A direct lateral portal was created with an 11-blade after spinal needle localization. An extensive subacromial bursectomy was performed using a combination of the shaver and Arthrocare wand. The entire acromial undersurface was exposed and the CA ligament was subperiosteally elevated to expose the anterior acromial hook. A 5.42m barrel burr was used to create a flat anterior and lateral aspect of the acromion, converting it from a Type 2 to a Type 1 acromion. Care was made to keep the deltoid fascia intact.   I then turned my attention to the arthroscopic distal clavicle excision. I identified the acromioclavicular joint. Surrounding bursal tissue was debrided and the edges of the joint were identified. I used the 5.51mbarrel burr to remove the distal clavicle parallel to the edge of the acromion. I was able to fit two widths of the burr into the space between the distal clavicle and acromion, signifying that I had removed ~1177mf distal clavicle.  The supraspinatus was visualized from the lateral portal and a full-thickness tear was noted.  Hemostasis was achieved with an Arthrocare wand. Fluid was evacuated from the shoulder.    A longitudinal incision from the anterolateral acromion ~7cm in length was made overlying the raphe between the anterior and middle heads of the deltoid. The raphe was identified and it was incised. The subacromial space was identified. Any remaining bursa was excised. The rotator cuff tear was identified. It was a small U-shaped tear.  We then turned our attention to the biceps tenodesis. The arm was externally rotated.  The bicipital groove was identified.  A 15 blade was used to make a cut overlying the biceps tendon, and the tendon was removed using a right angle clamp.  The base of the bicipital groove was identified and cleared of soft  tissue.  A FiberTak anchor was placed in the bicipital groove.  The biceps tendon was held at the appropriate amount of tension.  One set of sutures was passed through the biceps anchor with one limb passed in a simple fashion and the second limb passed in a simple plus locking stitch pattern.  This was repeated for the other set of sutures.  This construct allowed for shuttling the biceps tendon down to the bone.  The sutures were tied and cut.  The diseased portion of the proximal biceps was then excised.   The rotator cuff footprint was cleared of soft tissue.  Two Arthrex speed bridge medial row 4.75 mm anchors were placed at the anterior and posterior aspects of the tear just lateral to the articular margin.  The combined strand from each anchor was passed through the supraspinatus. Additionally the #2 FiberWire sutures were also passed through the supraspinatus. Next, Two SwiveLock anchors were placed for the lateral row anchors with one limb of each suture from the medial row anchor in each lateral row anchor.  This allowed for a crossed and linked double row repair with excellent reapproximation and compression of the rotator cuff over its footprint. The construct was stable with external and internal rotation.          The wound was thoroughly irrigated.  The deltoid split was closed with 0 Vicryl.  The subdermal layer was closed with 2-0 Vicryl.  The skin was closed with staples. The portals were closed with 3-0 Nylon. Xeroform was applied to the incisions. A  sterile dressing was applied, followed by a Polar Care sleeve and a SlingShot shoulder immobilizer/sling. The patient was awakened from anesthesia without difficulty and was transferred to the PACU in stable condition.    Of note, assistance from a PA was essential to performing the surgery.  PA was present for the entire surgery.  PA assisted with patient positioning, retraction, instrumentation, and wound closure. The surgery would have been  more difficult and had longer operative time without PA assistance.     COMPLICATIONS: none   DISPOSITION: plan for discharge home after recovery in PACU     POSTOPERATIVE PLAN: Remain in sling (except hygiene and elbow/wrist/hand RoM exercises as instructed by PT) x6 weeks and NWB for this time. PT to begin 3-4 days after surgery. Rotator cuff repair rehab protocol. ASA 329m daily x 2 weeks for DVT ppx.

## 2019-05-02 NOTE — H&P (Signed)
Paper H&P to be scanned into permanent record. H&P reviewed. No significant changes noted.  

## 2019-05-02 NOTE — Anesthesia Procedure Notes (Signed)
Anesthesia Regional Block: Interscalene brachial plexus block   Pre-Anesthetic Checklist: ,, timeout performed, Correct Patient, Correct Site, Correct Laterality, Correct Procedure, Correct Position, site marked, Risks and benefits discussed,  Surgical consent,  Pre-op evaluation,  At surgeon's request and post-op pain management  Laterality: Right  Prep: chloraprep       Needles:  Injection technique: Single-shot  Needle Type: Stimiplex     Needle Length: 5cm  Needle Gauge: 22     Additional Needles:   Procedures:,,,, ultrasound used (permanent image in chart),,,,  Narrative:  Injection made incrementally with aspirations every 5 mL.  Performed by: Personally   Additional Notes: Functioning IV was confirmed and monitors applied. Ultrasound guidance: relevant anatomy identified, needle position confirmed, local anesthetic spread visualized around nerve(s)., vascular puncture avoided.  Image printed for medical record.  Negative aspiration and no paresthesias; incremental administration of local anesthetic. The patient tolerated the procedure well. Vitals signes recorded in RN notes.

## 2019-05-02 NOTE — Transfer of Care (Signed)
Immediate Anesthesia Transfer of Care Note  Patient: Billy Hardin  Procedure(s) Performed: SHOULDER ARTHROSCOPY WITH MINI OPEN ROTATOR CUFF REPAIR, BICEPS TENODESIS, DISTAL CLAVICLE EXCISION AND SUBACROMIAL DECOMPRESSION. (Right Shoulder)  Patient Location: PACU  Anesthesia Type: General  Level of Consciousness: awake, alert  and patient cooperative  Airway and Oxygen Therapy: Patient Spontanous Breathing and Patient connected to supplemental oxygen  Post-op Assessment: Post-op Vital signs reviewed, Patient's Cardiovascular Status Stable, Respiratory Function Stable, Patent Airway and No signs of Nausea or vomiting  Post-op Vital Signs: Reviewed and stable  Complications: No apparent anesthesia complications

## 2019-05-02 NOTE — Anesthesia Preprocedure Evaluation (Addendum)
Anesthesia Evaluation  Patient identified by MRN, date of birth, ID band Patient awake    Reviewed: Allergy & Precautions, H&P , NPO status , Patient's Chart, lab work & pertinent test results, reviewed documented beta blocker date and time   History of Anesthesia Complications (+) history of anesthetic complications (urinary retention)  Airway Mallampati: II  TM Distance: >3 FB Neck ROM: full    Dental no notable dental hx.    Pulmonary neg pulmonary ROS,    Pulmonary exam normal breath sounds clear to auscultation       Cardiovascular Exercise Tolerance: Good hypertension,  Rhythm:regular Rate:Normal     Neuro/Psych  Headaches, negative psych ROS   GI/Hepatic negative GI ROS, Neg liver ROS,   Endo/Other  diabetes, Type 2  Renal/GU negative Renal ROS  negative genitourinary   Musculoskeletal   Abdominal   Peds  Hematology negative hematology ROS (+)   Anesthesia Other Findings   Reproductive/Obstetrics negative OB ROS                             Anesthesia Physical Anesthesia Plan  ASA: II  Anesthesia Plan: General   Post-op Pain Management: GA combined w/ Regional for post-op pain   Induction:   PONV Risk Score and Plan: 2 and Ondansetron and Dexamethasone  Airway Management Planned:   Additional Equipment:   Intra-op Plan:   Post-operative Plan:   Informed Consent: I have reviewed the patients History and Physical, chart, labs and discussed the procedure including the risks, benefits and alternatives for the proposed anesthesia with the patient or authorized representative who has indicated his/her understanding and acceptance.     Dental Advisory Given  Plan Discussed with: CRNA  Anesthesia Plan Comments:        Anesthesia Quick Evaluation

## 2019-05-02 NOTE — Anesthesia Postprocedure Evaluation (Signed)
Anesthesia Post Note  Patient: Billy Hardin  Procedure(s) Performed: SHOULDER ARTHROSCOPY WITH MINI OPEN ROTATOR CUFF REPAIR, BICEPS TENODESIS, DISTAL CLAVICLE EXCISION AND SUBACROMIAL DECOMPRESSION. (Right Shoulder)     Patient location during evaluation: PACU Anesthesia Type: General and Regional Level of consciousness: awake and alert Pain management: pain level controlled Vital Signs Assessment: post-procedure vital signs reviewed and stable Respiratory status: spontaneous breathing, nonlabored ventilation, respiratory function stable and patient connected to nasal cannula oxygen Cardiovascular status: blood pressure returned to baseline and stable Postop Assessment: no apparent nausea or vomiting Anesthetic complications: no    Alisa Graff

## 2019-05-02 NOTE — Anesthesia Procedure Notes (Signed)
Procedure Name: LMA Insertion Date/Time: 05/02/2019 7:41 AM Performed by: Cameron Ali, CRNA Pre-anesthesia Checklist: Patient identified, Emergency Drugs available, Suction available, Timeout performed and Patient being monitored Patient Re-evaluated:Patient Re-evaluated prior to induction Oxygen Delivery Method: Circle system utilized Preoxygenation: Pre-oxygenation with 100% oxygen Induction Type: IV induction LMA: LMA inserted LMA Size: 4.0 Number of attempts: 1 Placement Confirmation: positive ETCO2 and breath sounds checked- equal and bilateral Tube secured with: Tape Dental Injury: Teeth and Oropharynx as per pre-operative assessment

## 2019-05-05 DIAGNOSIS — M25611 Stiffness of right shoulder, not elsewhere classified: Secondary | ICD-10-CM | POA: Diagnosis not present

## 2019-05-05 DIAGNOSIS — M25511 Pain in right shoulder: Secondary | ICD-10-CM | POA: Diagnosis not present

## 2019-05-05 DIAGNOSIS — M25311 Other instability, right shoulder: Secondary | ICD-10-CM | POA: Diagnosis not present

## 2019-05-05 DIAGNOSIS — M6281 Muscle weakness (generalized): Secondary | ICD-10-CM | POA: Diagnosis not present

## 2019-05-09 ENCOUNTER — Other Ambulatory Visit: Payer: Self-pay | Admitting: Physician Assistant

## 2019-05-09 DIAGNOSIS — E119 Type 2 diabetes mellitus without complications: Secondary | ICD-10-CM

## 2019-05-09 DIAGNOSIS — G4700 Insomnia, unspecified: Secondary | ICD-10-CM

## 2019-05-09 DIAGNOSIS — F3289 Other specified depressive episodes: Secondary | ICD-10-CM

## 2019-05-12 NOTE — Telephone Encounter (Signed)
Requested Prescriptions  Pending Prescriptions Disp Refills  . metFORMIN (GLUCOPHAGE) 1000 MG tablet [Pharmacy Med Name: METFORMIN 1000MG TABLETS] 180 tablet 1    Sig: TAKE 1 TABLET BY MOUTH TWICE DAILY WITH A MEAL     Endocrinology:  Diabetes - Biguanides Passed - 05/09/2019  3:17 AM      Passed - Cr in normal range and within 360 days    Creat  Date Value Ref Range Status  02/06/2017 0.95 0.70 - 1.25 mg/dL Final    Comment:    For patients >10 years of age, the reference limit for Creatinine is approximately 13% higher for people identified as African-American. .    Creatinine, Ser  Date Value Ref Range Status  08/01/2018 1.00 0.76 - 1.27 mg/dL Final         Passed - HBA1C is between 0 and 7.9 and within 180 days    Hemoglobin A1C  Date Value Ref Range Status  12/17/2018 7.1 (A) 4.0 - 5.6 % Final  11/04/2012 6.8 (H) 4.2 - 6.3 % Final    Comment:    The American Diabetes Association recommends that a primary goal of therapy should be <7% and that physicians should reevaluate the treatment regimen in patients with HbA1c values consistently >8%.    Hgb A1c MFr Bld  Date Value Ref Range Status  08/01/2018 7.0 (H) 4.8 - 5.6 % Final    Comment:             Prediabetes: 5.7 - 6.4          Diabetes: >6.4          Glycemic control for adults with diabetes: <7.0          Passed - eGFR in normal range and within 360 days    GFR, Est African American  Date Value Ref Range Status  02/06/2017 97 > OR = 60 mL/min/1.5m Final   GFR calc Af Amer  Date Value Ref Range Status  08/01/2018 90 >59 mL/min/1.73 Final   GFR, Est Non African American  Date Value Ref Range Status  02/06/2017 84 > OR = 60 mL/min/1.769mFinal   GFR calc non Af Amer  Date Value Ref Range Status  08/01/2018 78 >59 mL/min/1.73 Final         Passed - Valid encounter within last 6 months    Recent Outpatient Visits          4 months ago Type 2 diabetes mellitus without complication, without  long-term [facility] use of [facility]   6 months ago Other depression   [facility]   8 months ago Insomnia, unspecified type   [facility]   9 months ago Annual physical exam   [facility] [place] 12 months ago Chronic pain of left lower extremity   [place]           . mirtazapine (REMERON) 30 MG tablet [Pharmacy Med Name: MIRTAZAPINE 30MG TABLETS] 90 tablet 0    Sig: TAKE 1 TABLET(30 MG) BY MOUTH AT BEDTIME     Psychiatry: Antidepressants - mirtazapine Passed - 05/09/2019  3:17 AM      Passed - AST in normal range and within 360 days    AST  Date Value Ref Range Status  08/01/2018 12 0 - 40 IU/L Final  SGOT(AST)  Date Value Ref Range Status  05/27/2014 15 15 - 37 Unit/L Final         Passed - ALT in normal range and within 360 days    ALT  Date Value Ref Range Status  08/01/2018 10 0 - 44 IU/L Final   SGPT (ALT)  Date Value Ref Range Status  05/27/2014 35 U/L Final    Comment:    14-63 NOTE: New Reference Range 12/02/13          Passed - Triglycerides in normal range and within 360 days    Triglycerides  Date Value Ref Range Status  08/01/2018 97 0 - 149 mg/dL Final  11/04/2012 296 (H) 0 - 200 mg/dL Final         Passed - Total Cholesterol in normal range and within 360 days    Cholesterol, Total  Date Value Ref Range Status  08/01/2018 111 100 - 199 mg/dL Final   Cholesterol  Date Value Ref Range Status  11/04/2012 165 0 - 200 mg/dL Final         Passed - WBC in normal range and within 360 days    WBC  Date Value Ref Range Status  08/01/2018 5.5 3.4 - 10.8 x10E3/uL Final  02/06/2017 5.4 3.8 - 10.8 Thousand/uL Final         Passed - Completed PHQ-2 or PHQ-9 in the last 360 days.      Passed - Valid encounter within last 6 months    Recent  Outpatient Visits          4 months ago Type 2 diabetes mellitus without complication, without long-term current use of insulin [facility])   [facility] [place], [place], [place]   6 months ago Other depression   [place], [place], [place]   8 months ago Insomnia, unspecified type   [facility] Carles Collet M, [place]   9 months ago Annual physical exam   [place], [place]   12 months ago Chronic pain of left lower extremity   [place], [place]

## 2019-07-21 ENCOUNTER — Encounter: Payer: Self-pay | Admitting: Physician Assistant

## 2019-07-21 ENCOUNTER — Other Ambulatory Visit: Payer: Self-pay | Admitting: Physician Assistant

## 2019-07-21 ENCOUNTER — Telehealth: Payer: Self-pay

## 2019-07-21 DIAGNOSIS — F3289 Other specified depressive episodes: Secondary | ICD-10-CM

## 2019-07-22 ENCOUNTER — Other Ambulatory Visit: Payer: Self-pay

## 2019-07-22 DIAGNOSIS — F3289 Other specified depressive episodes: Secondary | ICD-10-CM

## 2019-07-22 MED ORDER — VENLAFAXINE HCL ER 37.5 MG PO CP24: 37.5000 mg | ORAL_CAPSULE | Freq: Every day | ORAL | 0 refills | Status: DC

## 2019-07-22 NOTE — Telephone Encounter (Signed)
90 days given. Needs follow up, over due for DM and chronic follow up.

## 2019-07-22 NOTE — Telephone Encounter (Signed)
Patient send a request via MyChart for refill Venlafaxine XR 37.5 mg. L.O.V. was on 12/17/2018 and no upcoming appointments.

## 2019-07-22 NOTE — Telephone Encounter (Signed)
Patient was advised and schedule appointment for 07/29/2019 @ 8:00 AM.

## 2019-07-29 ENCOUNTER — Encounter: Payer: Self-pay | Admitting: Physician Assistant

## 2019-07-29 ENCOUNTER — Other Ambulatory Visit: Payer: Self-pay

## 2019-07-29 ENCOUNTER — Ambulatory Visit (INDEPENDENT_AMBULATORY_CARE_PROVIDER_SITE_OTHER): Payer: Medicare Other | Admitting: Physician Assistant

## 2019-07-29 VITALS — BP 122/74 | HR 83 | Temp 98.2°F | Wt 181.0 lb

## 2019-07-29 DIAGNOSIS — E119 Type 2 diabetes mellitus without complications: Secondary | ICD-10-CM | POA: Diagnosis not present

## 2019-07-29 DIAGNOSIS — E1159 Type 2 diabetes mellitus with other circulatory complications: Secondary | ICD-10-CM

## 2019-07-29 DIAGNOSIS — G4700 Insomnia, unspecified: Secondary | ICD-10-CM

## 2019-07-29 DIAGNOSIS — I10 Essential (primary) hypertension: Secondary | ICD-10-CM

## 2019-07-29 DIAGNOSIS — E1169 Type 2 diabetes mellitus with other specified complication: Secondary | ICD-10-CM

## 2019-07-29 DIAGNOSIS — E785 Hyperlipidemia, unspecified: Secondary | ICD-10-CM

## 2019-07-29 DIAGNOSIS — F3289 Other specified depressive episodes: Secondary | ICD-10-CM

## 2019-07-29 MED ORDER — TRAZODONE HCL 50 MG PO TABS: 25.0000 mg | ORAL_TABLET | Freq: Every evening | ORAL | 3 refills | Status: DC | PRN

## 2019-07-29 MED ORDER — BELSOMRA 10 MG PO TABS: 10.0000 mg | ORAL_TABLET | Freq: Every day | ORAL | 1 refills | Status: DC

## 2019-07-29 NOTE — Progress Notes (Signed)
Patient: Billy Hardin Male    DOB: 05/30/1950   69 y.o.   MRN: [id number] Visit Date: 07/29/2019  Today's Provider: Trinna Post, PA-C   Chief Complaint  Patient presents with  . Hyperlipidemia  . Hypertension  . Diabetes   Subjective:     HPI    Diabetes Mellitus Type II, Follow-up:   Lab Results  Component Value Date   HGBA1C 7.1 (A) 12/17/2018   HGBA1C 7.0 (H) 08/01/2018   HGBA1C 6.7 (A) 04/29/2018   Last seen for diabetes 7 months ago.  Management since then includes No changes. He reports excellent compliance with treatment. He is not having side effects.  Current symptoms include none and have been stable. Home blood sugar records: Pt only checks his blood sugar occasionally.    Episodes of hypoglycemia? no   Most Recent Eye Exam: UTD Weight trend: stable Current diet: in general, a "healthy" diet   Current exercise: Not as much  ------------------------------------------------------------------------   Hypertension, follow-up:  BP Readings from Last 3 Encounters:  05/02/19 (!) 159/78  12/17/18 (!) 141/80  08/01/18 118/71    He was last seen for hypertension 7 months ago.  BP at that visit was 141/80. Management since that visit includes No changes He reports excellent compliance with treatment. He is not having side effects.  He is not exercising. He is adherent to low salt diet.   Outside blood pressures are normal. He is experiencing none.  Patient denies chest pain, fatigue, lower extremity edema and palpitations.   Cardiovascular risk factors include advanced age (older than 30 for men, 6 for women), diabetes mellitus, dyslipidemia, hypertension and male gender.  Use of agents associated with hypertension: none.   ------------------------------------------------------------------------    Lipid/Cholesterol, Follow-up:   Last seen for this 7 months ago.  Management since that visit includes No changes.  Last Lipid  Panel:    Component Value Date/Time   CHOL 111 08/01/2018 1008   CHOL 165 11/04/2012 2101   TRIG 97 08/01/2018 1008   TRIG 296 (H) 11/04/2012 2101   HDL 49 08/01/2018 1008   HDL 41 11/04/2012 2101   CHOLHDL 2.3 08/01/2018 1008   VLDL 59 (H) 11/04/2012 2101   LDLCALC 43 08/01/2018 1008   LDLCALC 65 11/04/2012 2101    He reports excellent compliance with treatment. He is not having side effects.   Wt Readings from Last 3 Encounters:  07/29/19 181 lb (82.1 kg)  05/02/19 180 lb (81.6 kg)  12/17/18 179 lb (81.2 kg)   Recently had rotator cuff repair on right shoulder in 04/2019. He is currently in physical therapy.   Insomnia: Falls asleep at midnight and wakes up at 8 AM. Reports waking up intermittently. Reports he does not snore and has not been told he stops breathing. He is taking remeron 30 mg QHS, effexor 37.5 mg QHS and tradazone 50 mg QHS.   Depression: Reports improvement in mood after stopping working. He was laid off last year during the pandemic and then ultimately let go.  ------------------------------------------------------------------------    Allergies  Allergen Reactions  . Bupropion     insomnia  . Sertraline     Ejaculatory dysfunction  . Penicillins Rash     Current Outpatient Medications:  .  acetaminophen (TYLENOL) 500 MG tablet, Take 2 tablets (1,000 mg total) by mouth every 8 (eight) hours., Disp: 90 tablet, Rfl: 2 .  atorvastatin (LIPITOR) 20 MG tablet, Take 1 tablet (20 mg total)  by mouth daily., Disp: 90 tablet, Rfl: 3 .  blood glucose meter kit and supplies KIT, Dispense based on patient and insurance preference. Check sugar daily, Disp: 1 each, Rfl: 0 .  cholecalciferol (VITAMIN D) 400 units TABS tablet, Take 400 Units by mouth., Disp: , Rfl:  .  CINNAMON PO, Take 2 capsules by mouth daily., Disp: , Rfl:  .  metFORMIN (GLUCOPHAGE) 1000 MG tablet, TAKE 1 TABLET BY MOUTH TWICE DAILY WITH A MEAL, Disp: 180 tablet, Rfl: 1 .  metoprolol succinate  (TOPROL-XL) 50 MG 24 hr tablet, TAKE 1 TABLET BY MOUTH EVERY DAY WITH OR IMMEDIATELY FOLLOWING A MEAL, Disp: 90 tablet, Rfl: 0 .  mirtazapine (REMERON) 30 MG tablet, TAKE 1 TABLET(30 MG) BY MOUTH AT BEDTIME, Disp: 90 tablet, Rfl: 0 .  Omega-3 Fatty Acids (OMEGA 3 500 PO), Take by mouth., Disp: , Rfl:  .  omeprazole (PRILOSEC) 40 MG capsule, One daily as needed for heartburn (Patient not taking: Reported on 04/28/2019), Disp: 90 capsule, Rfl: 1 .  ondansetron (ZOFRAN ODT) 4 MG disintegrating tablet, Take 1 tablet (4 mg total) by mouth every 8 (eight) hours as needed for nausea or vomiting., Disp: 20 tablet, Rfl: 0 .  ONE TOUCH ULTRA TEST test strip, CHECK BLOOD SUGAR DAILY, Disp: 100 each, Rfl: 3 .  ONETOUCH DELICA LANCETS 16L MISC, TEST BLOOD SUGAR DAILY, Disp: 100 each, Rfl: 3 .  oxyCODONE (ROXICODONE) 5 MG immediate release tablet, Take 1-2 tablets (5-10 mg total) by mouth every 4 (four) hours as needed (pain)., Disp: 30 tablet, Rfl: 0 .  valACYclovir (VALTREX) 1000 MG tablet, TAKE 1 TABLET(1000 MG) BY MOUTH DAILY, Disp: 90 tablet, Rfl: 3 .  venlafaxine XR (EFFEXOR-XR) 37.5 MG 24 hr capsule, Take 1 capsule (37.5 mg total) by mouth daily with breakfast., Disp: 90 capsule, Rfl: 0 .  vitamin B-12 (CYANOCOBALAMIN) 1000 MCG tablet, Take 1,000 mcg by mouth daily., Disp: , Rfl:  .  vitamin E 400 UNIT capsule, Take 400 Units by mouth 2 (two) times daily., Disp: , Rfl:   Review of Systems  Constitutional: Negative.   Respiratory: Negative.   Cardiovascular: Negative.   Gastrointestinal: Negative.   Musculoskeletal: Negative.   Neurological: Negative for dizziness, light-headedness and headaches.    Social History   Tobacco Use  . Smoking status: Never Smoker  . Smokeless tobacco: Never Used  Substance Use Topics  . Alcohol use: Yes    Alcohol/week: 0.0 standard drinks    Comment: 1-2 drinks total of 2-3x a year      Objective:   Temp 98.2 F (36.8 C) (Temporal)   Wt 181 lb (82.1 kg)    BMI 24.55 kg/m  Vitals:   07/29/19 0813  Temp: 98.2 F (36.8 C)  TempSrc: Temporal  Weight: 181 lb (82.1 kg)  Body mass index is 24.55 kg/m.   Physical Exam Constitutional:      Appearance: Normal appearance.  Cardiovascular:     Rate and Rhythm: Normal rate and regular rhythm.     Heart sounds: Normal heart sounds.  Pulmonary:     Effort: Pulmonary effort is normal.     Breath sounds: Normal breath sounds.  Skin:    General: Skin is warm and dry.  Neurological:     Mental Status: He is alert and oriented to person, place, and time. Mental status is at baseline.  Psychiatric:        Mood and Affect: Mood normal.        Behavior: Behavior  normal.      No results found for any visits on 07/29/19.     Assessment & Plan    1. Hypertension associated with diabetes ([place])  Controlled, continue medications. Check labs as below. F/u 6 months chronic. Advised about welcome to medicare visit which we can schedule today.   - HgB A1c - Lipid Profile - Comprehensive Metabolic Panel (CMET)  2. Type 2 diabetes mellitus without complication, without long-term current use of insulin (HCC)  - POCT glycosylated hemoglobin (Hb A1C) - POCT UA - Microalbumin - HgB A1c - Lipid Profile - Comprehensive Metabolic Panel (CMET) - Urine Microalbumin w/creat. ratio  3. Hyperlipidemia associated with type 2 diabetes mellitus (HCC)  - HgB A1c - Lipid Profile - Comprehensive Metabolic Panel (CMET)  4. Insomnia, unspecified type  Change trazadone to belsomra. Advised it can take some time to go through insurance.   - Suvorexant (BELSOMRA) 10 MG TABS; Take 10 mg by mouth at bedtime.  Dispense: 30 tablet; Refill: 1  5. Other depression  Continue remeron and effexor.   The entirety of the information documented in the History of Present Illness, Review of Systems and Physical Exam were personally obtained by me. Portions of this information were initially documented by Ashley Royalty, CMA  and reviewed by me for thoroughness and accuracy.        Trinna Post, PA-C  [facility]

## 2019-07-29 NOTE — Patient Instructions (Signed)
Diabetes Mellitus and Exercise Exercising regularly is important for your overall health, especially when you have diabetes (diabetes mellitus). Exercising is not only about losing weight. It has many other health benefits, such as increasing muscle strength and bone density and reducing body fat and stress. This leads to improved fitness, flexibility, and endurance, all of which result in better overall health. Exercise has additional benefits for people with diabetes, including:  Reducing appetite.  Helping to lower and control blood glucose.  Lowering blood pressure.  Helping to control amounts of fatty substances (lipids) in the blood, such as cholesterol and triglycerides.  Helping the body to respond better to insulin (improving insulin sensitivity).  Reducing how much insulin the body needs.  Decreasing the risk for heart disease by: ? Lowering cholesterol and triglyceride levels. ? Increasing the levels of good cholesterol. ? Lowering blood glucose levels. What is my activity plan? Your health care provider or certified diabetes educator can help you make a plan for the type and frequency of exercise (activity plan) that works for you. Make sure that you:  Do at least 150 minutes of moderate-intensity or vigorous-intensity exercise each week. This could be brisk walking, biking, or water aerobics. ? Do stretching and strength exercises, such as yoga or weightlifting, at least 2 times a week. ? Spread out your activity over at least 3 days of the week.  Get some form of physical activity every day. ? Do not go more than 2 days in a row without some kind of physical activity. ? Avoid being inactive for more than 30 minutes at a time. Take frequent breaks to walk or stretch.  Choose a type of exercise or activity that you enjoy, and set realistic goals.  Start slowly, and gradually increase the intensity of your exercise over time. What do I need to know about managing my  diabetes?   Check your blood glucose before and after exercising. ? If your blood glucose is 240 mg/dL (13.3 mmol/L) or higher before you exercise, check your urine for ketones. If you have ketones in your urine, do not exercise until your blood glucose returns to normal. ? If your blood glucose is 100 mg/dL (5.6 mmol/L) or lower, eat a snack containing 15-20 grams of carbohydrate. Check your blood glucose 15 minutes after the snack to make sure that your level is above 100 mg/dL (5.6 mmol/L) before you start your exercise.  Know the symptoms of low blood glucose (hypoglycemia) and how to treat it. Your risk for hypoglycemia increases during and after exercise. Common symptoms of hypoglycemia can include: ? Hunger. ? Anxiety. ? Sweating and feeling clammy. ? Confusion. ? Dizziness or feeling light-headed. ? Increased heart rate or palpitations. ? Blurry vision. ? Tingling or numbness around the mouth, lips, or tongue. ? Tremors or shakes. ? Irritability.  Keep a rapid-acting carbohydrate snack available before, during, and after exercise to help prevent or treat hypoglycemia.  Avoid injecting insulin into areas of the body that are going to be exercised. For example, avoid injecting insulin into: ? The arms, when playing tennis. ? The legs, when jogging.  Keep records of your exercise habits. Doing this can help you and your health care provider adjust your diabetes management plan as needed. Write down: ? Food that you eat before and after you exercise. ? Blood glucose levels before and after you exercise. ? The type and amount of exercise you have done. ? When your insulin is expected to peak, if you use   insulin. Avoid exercising at times when your insulin is peaking.  When you start a new exercise or activity, work with your health care provider to make sure the activity is safe for you, and to adjust your insulin, medicines, or food intake as needed.  Drink plenty of water while  you exercise to prevent dehydration or heat stroke. Drink enough fluid to keep your urine clear or pale yellow. Summary  Exercising regularly is important for your overall health, especially when you have diabetes (diabetes mellitus).  Exercising has many health benefits, such as increasing muscle strength and bone density and reducing body fat and stress.  Your health care provider or certified diabetes educator can help you make a plan for the type and frequency of exercise (activity plan) that works for you.  When you start a new exercise or activity, work with your health care provider to make sure the activity is safe for you, and to adjust your insulin, medicines, or food intake as needed. This information is not intended to replace advice given to you by your health care provider. Make sure you discuss any questions you have with your health care provider. Document Revised: 11/23/2016 Document Reviewed: 10/11/2015 Elsevier Patient Education  2020 Elsevier Inc.  

## 2019-07-30 LAB — LIPID PANEL
Chol/HDL Ratio: 2.5 ratio (ref 0.0–5.0)
Cholesterol, Total: 128 mg/dL (ref 100–199)
HDL: 52 mg/dL (ref >39)
LDL Chol Calc (NIH): 50 mg/dL (ref 0–99)
Triglycerides: 156 mg/dL — ABNORMAL HIGH (ref 0–149)
VLDL Cholesterol Cal: 26 mg/dL (ref 5–40)

## 2019-07-30 LAB — COMPREHENSIVE METABOLIC PANEL
ALT: 12 IU/L (ref 0–44)
AST: 13 IU/L (ref 0–40)
Albumin/Globulin Ratio: 2.6 — ABNORMAL HIGH (ref 1.2–2.2)
Albumin: 4.7 g/dL (ref 3.8–4.8)
Alkaline Phosphatase: 62 IU/L (ref 39–117)
BUN/Creatinine Ratio: 12 (ref 10–24)
BUN: 14 mg/dL (ref 8–27)
Bilirubin Total: 0.4 mg/dL (ref 0.0–1.2)
CO2: 23 mmol/L (ref 20–29)
Calcium: 9.7 mg/dL (ref 8.6–10.2)
Chloride: 103 mmol/L (ref 96–106)
Creatinine, Ser: 1.21 mg/dL (ref 0.76–1.27)
GFR calc Af Amer: 71 mL/min/{1.73_m2} (ref >59)
GFR calc non Af Amer: 61 mL/min/{1.73_m2} (ref >59)
Globulin, Total: 1.8 g/dL (ref 1.5–4.5)
Glucose: 134 mg/dL — ABNORMAL HIGH (ref 65–99)
Potassium: 3.9 mmol/L (ref 3.5–5.2)
Sodium: 142 mmol/L (ref 134–144)
Total Protein: 6.5 g/dL (ref 6.0–8.5)

## 2019-07-30 LAB — MICROALBUMIN / CREATININE URINE RATIO
Creatinine, Urine: 113.4 mg/dL
Microalb/Creat Ratio: 4 mg/g creat (ref 0–29)
Microalbumin, Urine: 4.9 ug/mL

## 2019-07-30 LAB — HEMOGLOBIN A1C
Est. average glucose Bld gHb Est-mCnc: 160 mg/dL
Hgb A1c MFr Bld: 7.2 % — ABNORMAL HIGH (ref 4.8–5.6)

## 2019-08-25 ENCOUNTER — Encounter: Payer: Self-pay | Admitting: Physician Assistant

## 2019-08-25 ENCOUNTER — Telehealth: Payer: Self-pay

## 2019-08-25 DIAGNOSIS — G4700 Insomnia, unspecified: Secondary | ICD-10-CM

## 2019-08-25 DIAGNOSIS — F3289 Other specified depressive episodes: Secondary | ICD-10-CM

## 2019-08-25 DIAGNOSIS — G43709 Chronic migraine without aura, not intractable, without status migrainosus: Secondary | ICD-10-CM

## 2019-08-25 MED ORDER — METOPROLOL SUCCINATE ER 50 MG PO TB24: ORAL_TABLET | ORAL | 1 refills | Status: DC

## 2019-08-25 MED ORDER — MIRTAZAPINE 30 MG PO TABS: ORAL_TABLET | ORAL | 1 refills | Status: DC

## 2019-08-25 NOTE — Telephone Encounter (Signed)
Patient send a message via MyChart requesting Metoprolol and Mirtazapine. L.O.V. was on 07/29/2019 and medication send into pharmacy.

## 2019-08-27 MED ORDER — MIRTAZAPINE 30 MG PO TABS: ORAL_TABLET | ORAL | 1 refills | Status: DC

## 2019-08-27 MED ORDER — METOPROLOL SUCCINATE ER 50 MG PO TB24: ORAL_TABLET | ORAL | 1 refills | Status: DC

## 2019-08-27 NOTE — Telephone Encounter (Signed)
Can we call the pharmacy and see if they suggest more affordable sleep medication alternatives? He has tried trazadone without success. Belsmora was unaffordable to him. He has been tapered off klonopin and I do not wish to restart benzodiazepines. Do they have any suggestions? Thanks.

## 2019-08-28 ENCOUNTER — Telehealth: Payer: Self-pay

## 2019-08-28 NOTE — Telephone Encounter (Signed)
Per [facility] patient should call his insurance company to see what medication they will cover in place of Belsomra. Attempted to contact patient, no answer left a voicemail. [place] for [facility] to advise patient.

## 2019-08-29 NOTE — Telephone Encounter (Signed)
Attempted to call pt.  Left detailed message on home and cell phone re: Pharmacy recommending pt. to call their insurance company, to inquire about what medication they will cover, in place of Belsomra.  Advised to call office if any further questions.

## 2019-09-01 NOTE — Telephone Encounter (Signed)
Attempted to call pt.  Left vm on mobile and home phone to call office to discuss one of his medications.

## 2019-09-01 NOTE — Telephone Encounter (Signed)
Pt returned call, verbalizes understanding. States he will contact his insurance co as directed.

## 2019-10-15 IMAGING — CR DG HIP (WITH OR WITHOUT PELVIS) 2-3V*R*
1 series · 3 of 3 positions shown · non-contrast
Comparison: Pelvic CT 11/19/2015.

CLINICAL DATA: Right hip pain for 1 or 2 days.  No known injury.

EXAM:
DG HIP (WITH OR WITHOUT PELVIS) 2-3V RIGHT

[Series 1: dg hip unilat w or w/o pelvis 2-3 views  · non-contrast · 0.14mm/px · 3 of 3 slices shown]
[im 1/3]
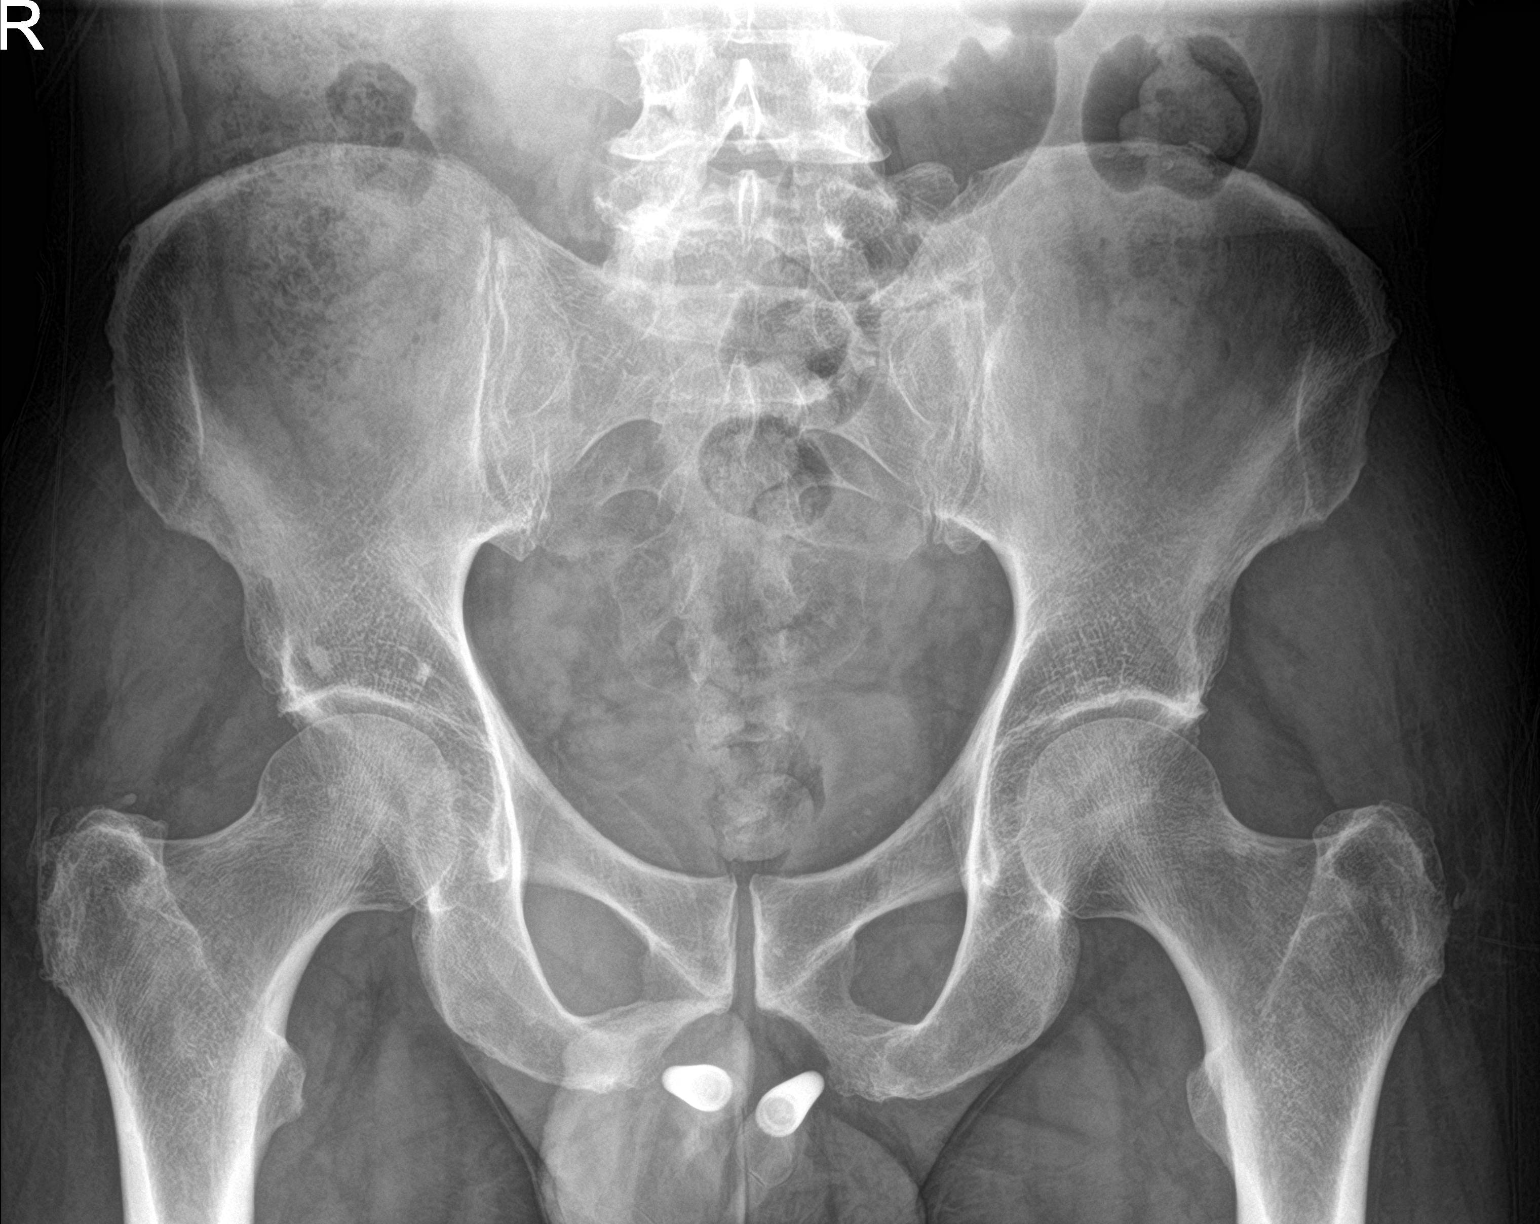
[im 2/3]
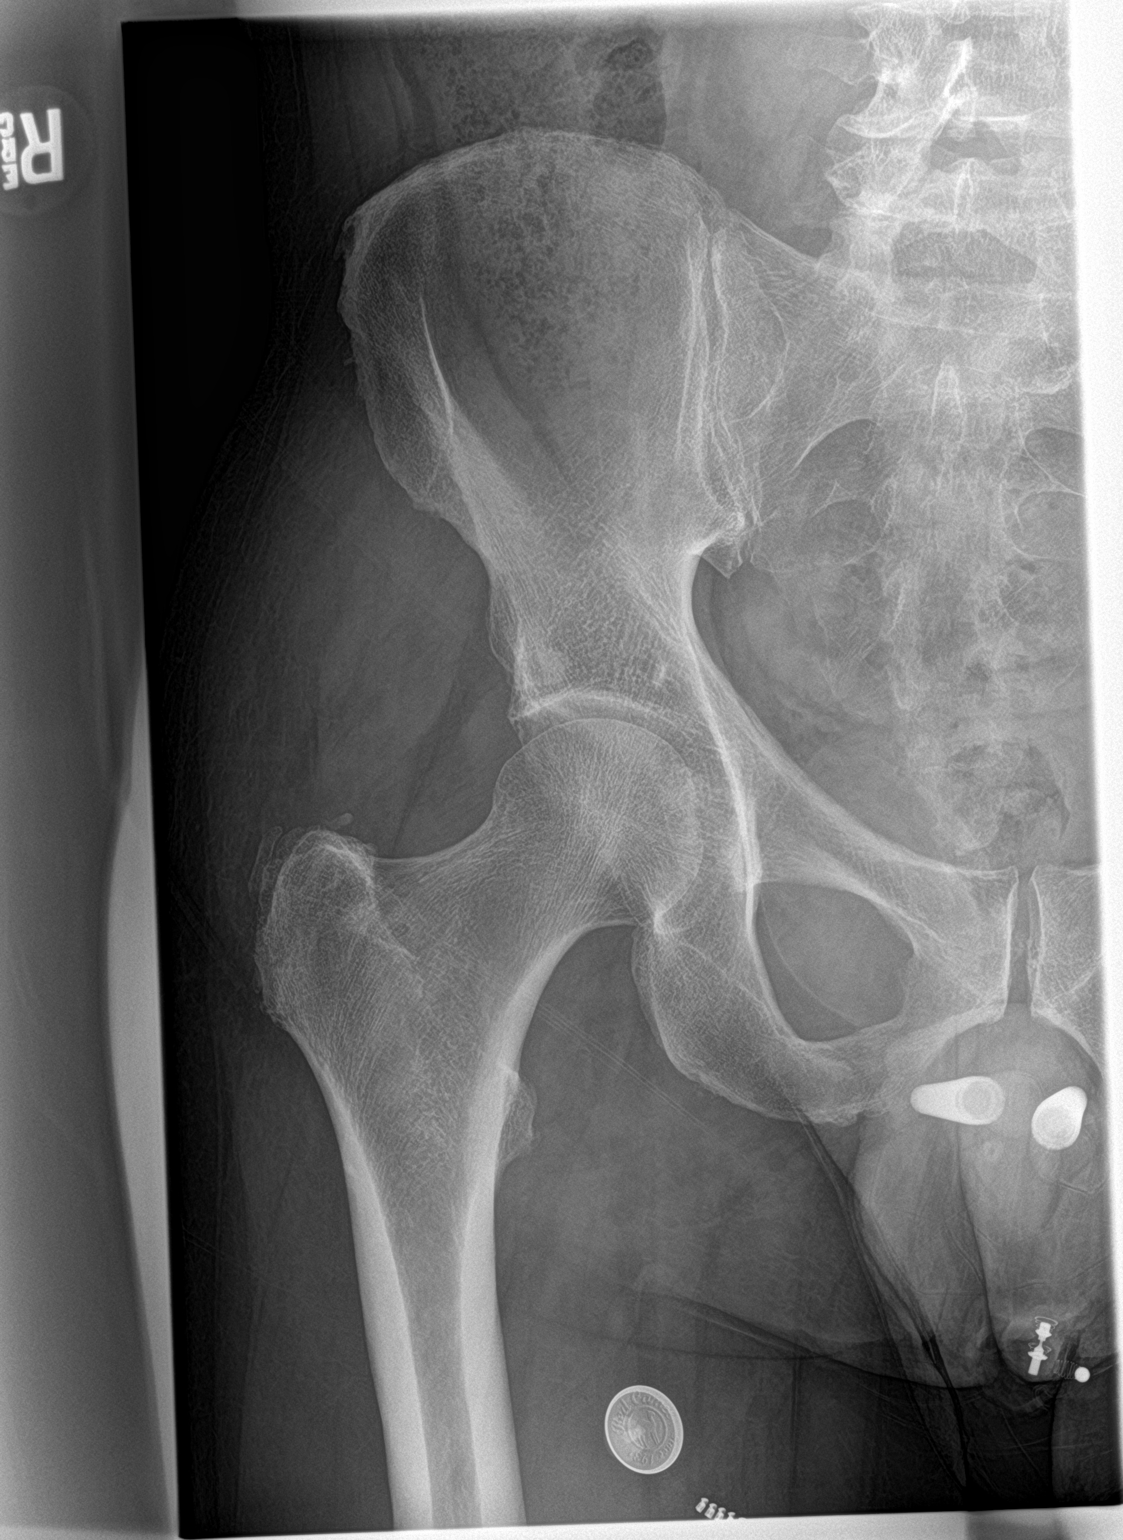
[im 3/3]
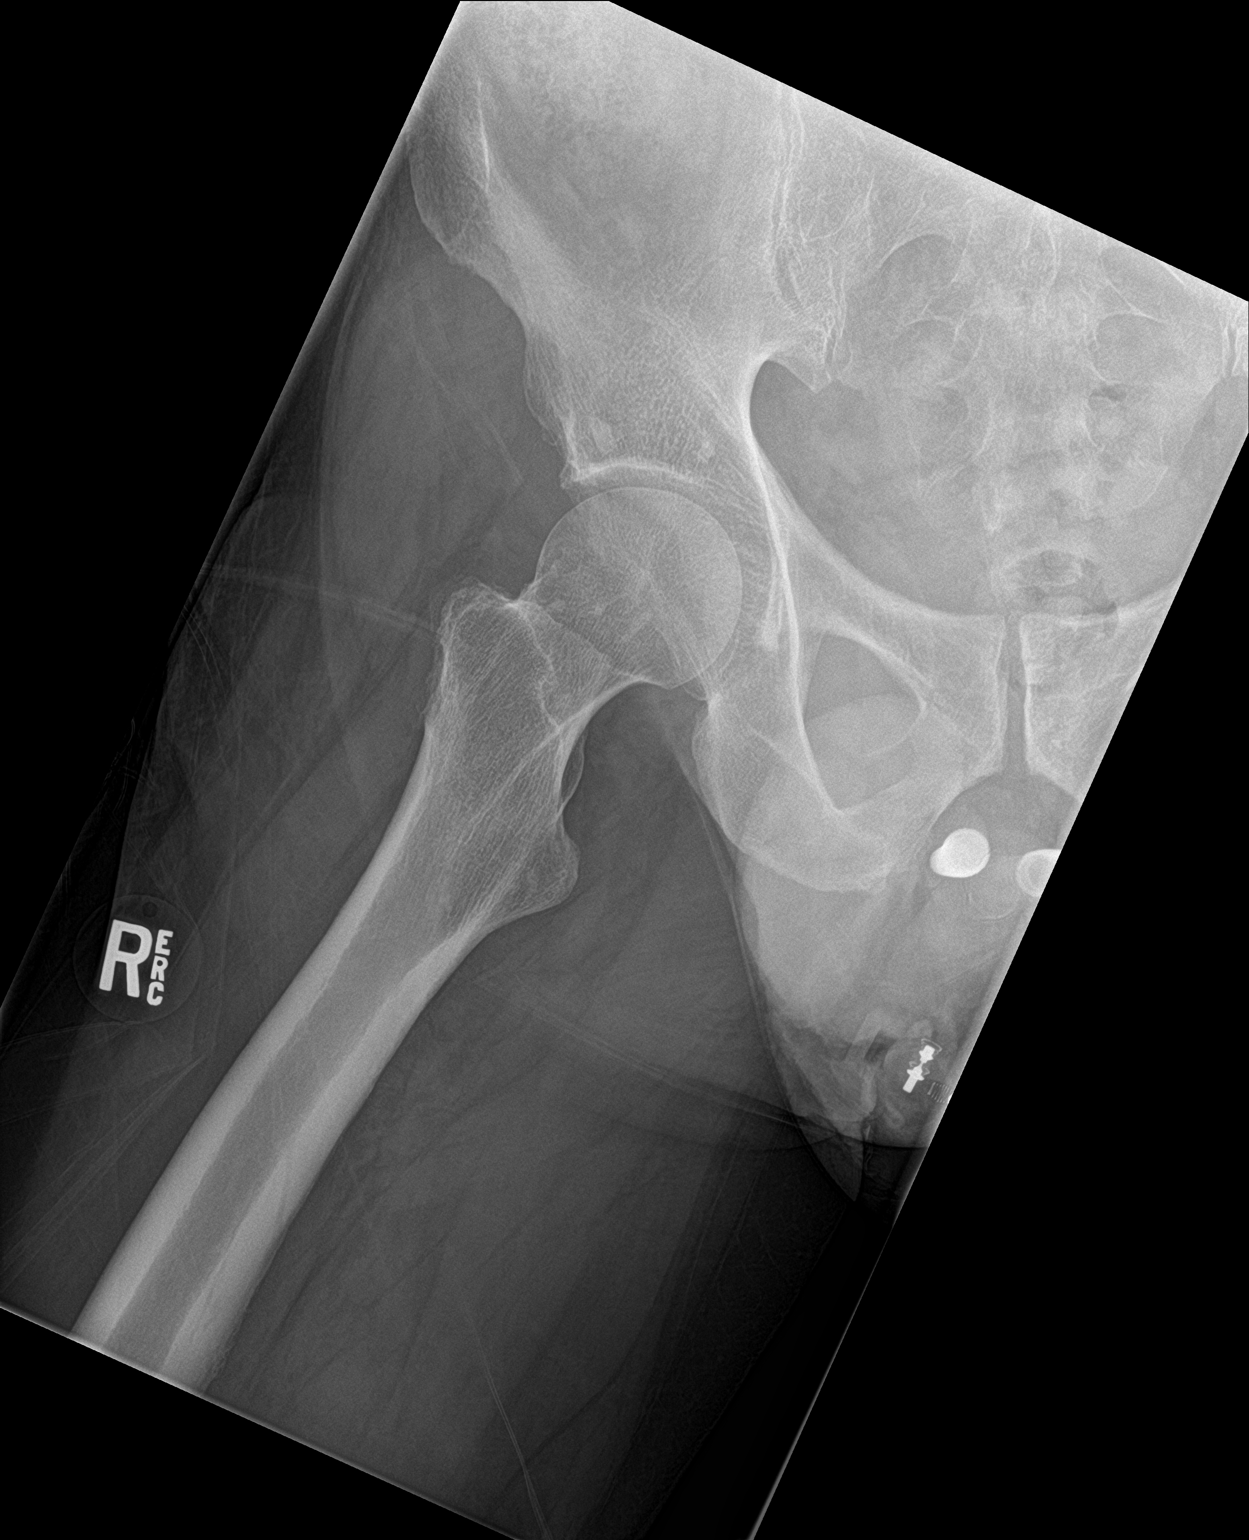

[3 of 3 positions shown; findings below may reference images not displayed]

FINDINGS: The mineralization and alignment are normal. There is no evidence of
acute fracture or dislocation. No evidence of femoral head avascular
necrosis. The hip joint spaces are maintained. There are stable
small bone islands in the right acetabulum. Left-sided lumbosacral
assimilation joint and penile prosthesis noted.
IMPRESSION: No acute osseous findings or significant arthropathic changes.

## 2019-10-27 ENCOUNTER — Other Ambulatory Visit: Payer: Self-pay | Admitting: Physician Assistant

## 2019-10-30 ENCOUNTER — Encounter: Payer: Medicare Other | Admitting: Physician Assistant

## 2019-11-05 NOTE — Progress Notes (Signed)
Annual Wellness Visit     Patient: Billy Hardin, Male    DOB: [birth year], 69 y.o.   MRN: [id number] Visit Date: 11/06/2019  Today's Provider: Trinna Post, PA-C   Chief Complaint  Patient presents with  . Welcome to Medicare   Subjective    Billy Hardin is a 69 y.o. male who presents today for his Initial Medicare Wellness Visit. He reports consuming a general diet. The patient does not participate in regular exercise at present. He generally feels fairly well. He reports sleeping fairly well. He does not have additional problems to discuss today.         Medications: Outpatient Medications Prior to Visit  Medication Sig  . atorvastatin (LIPITOR) 20 MG tablet Take 1 tablet by mouth once daily  . blood glucose meter kit and supplies KIT Dispense based on patient and insurance preference. Check sugar daily  . cholecalciferol (VITAMIN D) 400 units TABS tablet Take 400 Units by mouth.  [name] CINNAMON PO Take 2 capsules by mouth daily.  . metFORMIN (GLUCOPHAGE) 1000 MG tablet TAKE 1 TABLET BY MOUTH TWICE DAILY WITH A MEAL  . metoprolol succinate (TOPROL-XL) 50 MG 24 hr tablet Take with or immediately following a meal.  . mirtazapine (REMERON) 30 MG tablet TAKE 1 TABLET(30 MG) BY MOUTH AT BEDTIME  . Omega-3 Fatty Acids (OMEGA 3 500 PO) Take by mouth.  [name] omeprazole (PRILOSEC) 40 MG capsule One daily as needed for heartburn  . ONE TOUCH ULTRA TEST test strip CHECK BLOOD SUGAR DAILY  . ONETOUCH DELICA LANCETS 94I MISC TEST BLOOD SUGAR DAILY  . vitamin B-12 (CYANOCOBALAMIN) 1000 MCG tablet Take 1,000 mcg by mouth daily.  . vitamin E 400 UNIT capsule Take 400 Units by mouth 2 (two) times daily.  . [DISCONTINUED] valACYclovir (VALTREX) 1000 MG tablet TAKE 1 TABLET(1000 MG) BY MOUTH DAILY  . acetaminophen (TYLENOL) 500 MG tablet Take 2 tablets (1,000 mg total) by mouth every 8 (eight) hours. (Patient not taking: Reported on 11/06/2019)  . Suvorexant (BELSOMRA) 10 MG TABS Take 10  mg by mouth at bedtime. (Patient not taking: Reported on 11/06/2019)  . venlafaxine XR (EFFEXOR-XR) 37.5 MG 24 hr capsule Take 1 capsule (37.5 mg total) by mouth daily with breakfast.   No facility-administered medications prior to visit.    Allergies  Allergen Reactions  . Bupropion     insomnia  . Sertraline     Ejaculatory dysfunction  . Penicillins Rash    Patient Care Team: Paulene Floor as PCP - General (Physician Assistant)  Review of Systems  Constitutional: Negative.   HENT: Negative.   Eyes: Negative.   Respiratory: Negative.   Cardiovascular: Negative.   Gastrointestinal: Negative.   Endocrine: Negative.   Genitourinary: Negative.   Musculoskeletal: Negative.   Skin: Negative.   Allergic/Immunologic: Negative.   Neurological: Negative.   Hematological: Negative.   Psychiatric/Behavioral: Negative.       Objective    Vitals: BP 140/84 (BP Location: Left Arm, Patient Position: Sitting, Cuff Size: Normal)   Pulse 82   Temp 98.5 F (36.9 C)   Resp 16   Ht 6' (1.829 m)   Wt 185 lb (83.9 kg)   BMI 25.09 kg/m    Physical Exam Constitutional:      Appearance: Normal appearance.  Cardiovascular:     Rate and Rhythm: Normal rate and regular rhythm.     Pulses: Normal pulses.     Heart sounds: Normal heart sounds.  Pulmonary:     Effort: Pulmonary effort is normal.     Breath sounds: Normal breath sounds.  Abdominal:     General: Bowel sounds are normal.  Skin:    General: Skin is warm and dry.  Neurological:     Mental Status: He is alert and oriented to person, place, and time. Mental status is at baseline.  Psychiatric:        Mood and Affect: Mood normal.        Behavior: Behavior normal.     Most recent functional status assessment: In your present state of health, do you have any difficulty performing the following activities: 11/06/2019  Hearing? Y  Vision? Y  Difficulty concentrating or making decisions? N  Walking or climbing  stairs? N  Dressing or bathing? N  Doing errands, shopping? N  Some recent data might be hidden   Most recent fall risk assessment: Fall Risk  11/06/2019  Falls in the past year? 0  Number falls in past yr: 0  Comment -  Injury with Fall? 0  Comment -  Follow up Falls evaluation completed    Most recent depression screenings: PHQ 2/9 Scores 11/06/2019 07/29/2019  PHQ - 2 Score 0 0  PHQ- 9 Score - 5   Most recent cognitive screening: Cognitive Testing - 6-CIT  Correct? Score   What year is it? yes 0 0 or 4  What month is it? yes 0 0 or 3  Memorize:    Pia Mau,  42,  [address],  [place],      What time is it? (within 1 hour) yes 0 0 or 3  Count backwards from 20 yes 0 0, 2, or 4  Name the months of the year yes 0 0, 2, or 4  Repeat name & address above yes 0 0, 2, 4, 6, 8, or 10       TOTAL SCORE  0/28   Interpretation:  Normal  Normal (0-7) Abnormal (8-28)   Most recent Audit-C alcohol use screening Alcohol Use Disorder Test (AUDIT) 11/06/2019  1. How often do you have a drink containing alcohol? 1  2. How many drinks containing alcohol do you have on a typical day when you are drinking? 0  3. How often do you have six or more drinks on one occasion? 0  AUDIT-C Score 1  Alcohol Brief Interventions/Follow-up AUDIT Score <7 follow-up not indicated   A score of 3 or more in women, and 4 or more in men indicates increased risk for alcohol abuse, EXCEPT if all of the points are from question 1   No results found for any visits on 11/06/19.  Assessment & Plan    1. Welcome to Medicare preventive visit - EKG 12-Lead  2. Lip lesion  - valACYclovir (VALTREX) 1000 MG tablet; TAKE 1 TABLET(1000 MG) BY MOUTH DAILY  Dispense: 90 tablet; Refill: 3  Annual wellness visit done today including the all of the following: Reviewed patient's Family Medical History Reviewed and updated list of patient's medical providers Assessment of cognitive impairment was done Assessed  patient's functional ability Established a written schedule for health screening [place] Completed and Reviewed  Exercise Activities and Dietary recommendations Goals   None     Immunization History  Administered Date(s) Administered  . Fluad Quad(high Dose 65+) 02/05/2019  . Influenza-Unspecified 02/13/2015, 04/01/2016, 03/16/2017  . Pneumococcal Conjugate-13 05/30/2016  . Pneumococcal Polysaccharide-23 05/26/2015  . Td 06/04/2017  . Tdap 01/16/2007  .  Zoster 06/04/2012    Health Maintenance  Topic Date Due  . Hepatitis C Screening  Never done  . COVID-19 Vaccine (1) Never done  . INFLUENZA VACCINE  12/14/2019  . HEMOGLOBIN A1C  01/29/2020  . OPHTHALMOLOGY EXAM  02/07/2020  . PNA vac Low Risk Adult (2 of 2 - PPSV23) 05/25/2020  . FOOT EXAM  07/28/2020  . URINE MICROALBUMIN  07/28/2020  . COLONOSCOPY  09/29/2026  . TETANUS/TDAP  06/05/2027     Discussed health benefits of physical activity, and encouraged him to engage in regular exercise appropriate for his age and condition.     Return in about 1 year (around 11/05/2020).       Paulene Floor  [facility] [phone] (phone) [phone] (fax)  [place]

## 2019-11-06 ENCOUNTER — Ambulatory Visit (INDEPENDENT_AMBULATORY_CARE_PROVIDER_SITE_OTHER): Payer: Medicare Other | Admitting: Physician Assistant

## 2019-11-06 ENCOUNTER — Other Ambulatory Visit: Payer: Self-pay

## 2019-11-06 ENCOUNTER — Encounter: Payer: Self-pay | Admitting: Physician Assistant

## 2019-11-06 VITALS — BP 140/84 | HR 82 | Temp 98.5°F | Resp 16 | Ht 72.0 in | Wt 185.0 lb

## 2019-11-06 DIAGNOSIS — Z0000 Encounter for general adult medical examination without abnormal findings: Secondary | ICD-10-CM | POA: Diagnosis not present

## 2019-11-06 DIAGNOSIS — K130 Diseases of lips: Secondary | ICD-10-CM | POA: Diagnosis not present

## 2019-11-06 MED ORDER — VALACYCLOVIR HCL 1 G PO TABS: ORAL_TABLET | ORAL | 3 refills | Status: DC

## 2019-11-24 ENCOUNTER — Other Ambulatory Visit: Payer: Self-pay | Admitting: Physician Assistant

## 2019-11-24 DIAGNOSIS — F3289 Other specified depressive episodes: Secondary | ICD-10-CM

## 2019-12-02 ENCOUNTER — Other Ambulatory Visit: Payer: Self-pay | Admitting: Physician Assistant

## 2019-12-02 DIAGNOSIS — E119 Type 2 diabetes mellitus without complications: Secondary | ICD-10-CM

## 2019-12-02 NOTE — Telephone Encounter (Signed)
Requested Prescriptions  Pending Prescriptions Disp Refills  . metFORMIN (GLUCOPHAGE) 1000 MG tablet [Pharmacy Med Name: metFORMIN HCl 1000 MG Oral Tablet] 180 tablet 0    Sig: TAKE 1 TABLET BY MOUTH TWICE DAILY WITH A MEAL     Endocrinology:  Diabetes - Biguanides Passed - 12/02/2019  9:18 AM      Passed - Cr in normal range and within 360 days    Creat  Date Value Ref Range Status  02/06/2017 0.95 0.70 - 1.25 mg/dL Final    Comment:    For patients >31 years of age, the reference limit for Creatinine is approximately 13% higher for people identified as African-American. .    Creatinine, Ser  Date Value Ref Range Status  07/29/2019 1.21 0.76 - 1.27 mg/dL Final         Passed - HBA1C is between 0 and 7.9 and within 180 days    Hemoglobin A1C  Date Value Ref Range Status  11/04/2012 6.8 (H) 4.2 - 6.3 % Final    Comment:    The American Diabetes Association recommends that a primary goal of therapy should be <7% and that physicians should reevaluate the treatment regimen in patients with HbA1c values consistently >8%.    Hgb A1c MFr Bld  Date Value Ref Range Status  07/29/2019 7.2 (H) 4.8 - 5.6 % Final    Comment:             Prediabetes: 5.7 - 6.4          Diabetes: >6.4          Glycemic control for adults with diabetes: <7.0          Passed - eGFR in normal range and within 360 days    GFR, Est African American  Date Value Ref Range Status  02/06/2017 97 > OR = 60 mL/min/1.77m Final   GFR calc Af Amer  Date Value Ref Range Status  07/29/2019 71 >59 mL/min/1.73 Final   GFR, Est Non African American  Date Value Ref Range Status  02/06/2017 84 > OR = 60 mL/min/1.742mFinal   GFR calc non Af Amer  Date Value Ref Range Status  07/29/2019 61 >59 mL/min/1.73 Final         Passed - Valid encounter within last 6 months    Recent Outpatient Visits          3 weeks ago [facility]   [facility] CorporationAdriana M, PA-C   4  months ago [facility]  [place]   11 months ago Type 2 diabetes mellitus without complication, [facility] long-term [facility] use [facility]  [facility] [place] 1 year ago Other depression   [place] 1 year ago Insomnia, unspecified type   [facility]Wendee BeaversPA-C      Future Appointments            In 1 month PoTerrilee CroakAdWendee BeaversPA-C [facility]

## 2019-12-23 ENCOUNTER — Other Ambulatory Visit: Payer: Self-pay | Admitting: Physician Assistant

## 2019-12-23 DIAGNOSIS — G4700 Insomnia, unspecified: Secondary | ICD-10-CM

## 2019-12-25 ENCOUNTER — Other Ambulatory Visit: Payer: Self-pay | Admitting: Physician Assistant

## 2019-12-25 DIAGNOSIS — G4700 Insomnia, unspecified: Secondary | ICD-10-CM

## 2019-12-28 ENCOUNTER — Other Ambulatory Visit: Payer: Self-pay | Admitting: Physician Assistant

## 2019-12-28 DIAGNOSIS — G4700 Insomnia, unspecified: Secondary | ICD-10-CM

## 2020-01-25 ENCOUNTER — Other Ambulatory Visit: Payer: Self-pay | Admitting: Physician Assistant

## 2020-01-25 NOTE — Telephone Encounter (Signed)
Requested Prescriptions  Pending Prescriptions Disp Refills   atorvastatin (LIPITOR) 20 MG tablet [Pharmacy Med Name: Atorvastatin Calcium 20 MG Oral Tablet] 90 tablet 0    Sig: Take 1 tablet by mouth once daily     Cardiovascular:  Antilipid - Statins Failed - 01/25/2020 11:33 AM      Failed - LDL in normal range and within 360 days    Ldl Cholesterol, Calc  Date Value Ref Range Status  11/04/2012 65 0 - 100 mg/dL Final   LDL Chol Calc (NIH)  Date Value Ref Range Status  07/29/2019 50 0 - 99 mg/dL Final         Failed - Triglycerides in normal range and within 360 days    Triglycerides  Date Value Ref Range Status  07/29/2019 156 (H) 0 - 149 mg/dL Final  11/04/2012 296 (H) 0 - 200 mg/dL Final         Passed - Total Cholesterol in normal range and within 360 days    Cholesterol, Total  Date Value Ref Range Status  07/29/2019 128 100 - 199 mg/dL Final   Cholesterol  Date Value Ref Range Status  11/04/2012 165 0 - 200 mg/dL Final         Passed - HDL in normal range and within 360 days    HDL Cholesterol  Date Value Ref Range Status  11/04/2012 41 40 - 60 mg/dL Final   HDL  Date Value Ref Range Status  07/29/2019 52 >39 mg/dL Final         Passed - Patient is not pregnant      Passed - Valid encounter within last 12 months    Recent Outpatient Visits          2 months ago [facility] to [employer] preventive visit   [employer], Adriana M, PA-C   6 months ago Hypertension associated with diabetes [facility])   [place], [place], PA-C   1 year ago Type 2 diabetes mellitus without complication, without long-term current use of insulin [facility])   [facility] [place], [place], [place]   1 year ago Other depression   [place], [place], [place]   1 year ago Insomnia, unspecified type   [place], Wendee Beavers, PA-C      Future Appointments            In 4 days Trinna Post, PA-C [employer]

## 2020-01-28 NOTE — Progress Notes (Signed)
Established patient visit   Patient: Billy Hardin   DOB: Oct 03, 1950   69 y.o. Male  MRN: [id number] Visit Date: 01/29/2020  Today's healthcare provider: Trinna Post, PA-C   Chief Complaint  Patient presents with  . Diabetes  I,Porsha C McClurkin,acting as a scribe for [employer], PA-C.,have documented all relevant documentation on the behalf of Trinna Post, PA-C,as directed by  Trinna Post, PA-C while in the presence of Trinna Post, PA-C.  Subjective    HPI  Diabetes Mellitus Type II, Follow-up  Lab Results  Component Value Date   HGBA1C 7.2 (H) 07/29/2019   HGBA1C 7.1 (A) 12/17/2018   HGBA1C 7.0 (H) 08/01/2018   Wt Readings from Last 3 Encounters:  01/29/20 181 lb 6.4 oz (82.3 kg)  11/06/19 185 lb (83.9 kg)  07/29/19 181 lb (82.1 kg)   Last seen for diabetes 6 months ago.  Management since then includes no changes. He reports good compliance with treatment. He is not having side effects.  Symptoms: No fatigue No foot ulcerations  No appetite changes No nausea  No paresthesia of the feet  No polydipsia  No polyuria No visual disturbances   No vomiting     Home blood sugar records: fasting range: 180's-190's  Episodes of hypoglycemia? No    Current insulin regiment: none Most Recent Eye Exam: UTD Current exercise: no regular exercise Current diet habits: well balanced  Pertinent Labs: Lab Results  Component Value Date   CHOL 128 07/29/2019   HDL 52 07/29/2019   LDLCALC 50 07/29/2019   TRIG 156 (H) 07/29/2019   CHOLHDL 2.5 07/29/2019   Lab Results  Component Value Date   NA 142 07/29/2019   K 3.9 07/29/2019   CREATININE 1.21 07/29/2019   GFRNONAA 61 07/29/2019   GFRAA 71 07/29/2019   GLUCOSE 134 (H) 07/29/2019     --------------------------------------------------------------------------------------------------- Lipid/Cholesterol, Follow-up  Last lipid panel Other pertinent labs  Lab Results  Component Value  Date   CHOL 128 07/29/2019   HDL 52 07/29/2019   LDLCALC 50 07/29/2019   TRIG 156 (H) 07/29/2019   CHOLHDL 2.5 07/29/2019   Lab Results  Component Value Date   ALT 12 07/29/2019   AST 13 07/29/2019   PLT 199 08/01/2018   TSH 1.270 08/01/2018     He was last seen for this 6 months ago.  Management since that visit includes continue statin.  He reports excellent compliance with treatment. He is not having side effects.   Symptoms: No chest pain No chest pressure/discomfort  No dyspnea No lower extremity edema  No numbness or tingling of extremity No orthopnea  No palpitations No paroxysmal nocturnal dyspnea  No speech difficulty No syncope   Current diet: in general, a "healthy" diet   Current exercise: aerobics  The ASCVD Risk score (Elderon., et al., 2013) failed to calculate for the following reasons:   The valid total cholesterol range is 130 to 320 mg/dL  ---------------------------------------------------------------------------------------------------  Hypertension, follow-up  BP Readings from Last 3 Encounters:  01/29/20 134/74  11/06/19 140/84  07/29/19 122/74   Wt Readings from Last 3 Encounters:  01/29/20 181 lb 6.4 oz (82.3 kg)  11/06/19 185 lb (83.9 kg)  07/29/19 181 lb (82.1 kg)     He was last seen for hypertension 6 months ago.  BP at that visit was normal. Management since that visit includes continue metoprolol.  He reports excellent compliance with treatment. He is  not having side effects.   He is following a Regular diet. He is exercising. He does not smoke.  Use of agents associated with hypertension: none.   Outside blood pressures are not checked. Symptoms: No chest pain No chest pressure  No palpitations No syncope  No dyspnea No orthopnea  No paroxysmal nocturnal dyspnea No lower extremity edema   Pertinent labs: Lab Results  Component Value Date   CHOL 128 07/29/2019   HDL 52 07/29/2019   LDLCALC 50 07/29/2019   TRIG  156 (H) 07/29/2019   CHOLHDL 2.5 07/29/2019   Lab Results  Component Value Date   NA 142 07/29/2019   K 3.9 07/29/2019   CREATININE 1.21 07/29/2019   GFRNONAA 61 07/29/2019   GFRAA 71 07/29/2019   GLUCOSE 134 (H) 07/29/2019     The ASCVD Risk score (Goff DC Jr., et al., 2013) failed to calculate for the following reasons:   The valid total cholesterol range is 130 to 320 mg/dL   ---------------------------------------------------------------------------------------------------  Depression, Follow-up  He  was last seen for this 6 months ago. Changes made at last visit include continue remeron, and effexor. He was switched from trazodone to belsomra however belsomra was unaffordable to him. He has since had trouble sleeping and has resumed trazodone.   He reports good compliance with treatment. He is not having side effects.   He reports good tolerance of treatment. Current symptoms include: insomnia He feels he is Worse since last visit.  Depression screen [facility] 2/9 01/29/2020 11/06/2019 07/29/2019  Decreased Interest 0 0 0  Down, Depressed, Hopeless 0 0 0  PHQ - 2 Score 0 0 0  Altered sleeping 3 - 3  Tired, decreased energy 1 - 1  Change in appetite 0 - 1  Feeling bad or failure about yourself  0 - 0  Trouble concentrating 0 - 0  Moving slowly or fidgety/restless 0 - 0  Suicidal thoughts 0 - 0  PHQ-9 Score 4 - 5  Difficult doing work/chores Not difficult at all - Not difficult at all    -----------------------------------------------------------------------------------------     Medications: Outpatient Medications Prior to Visit  Medication Sig  . atorvastatin (LIPITOR) 20 MG tablet Take 1 tablet by mouth once daily  . blood glucose meter kit and supplies KIT Dispense based on patient and insurance preference. Check sugar daily  . cholecalciferol (VITAMIN D) 400 units TABS tablet Take 400 Units by mouth.  [name] CINNAMON PO Take 2 capsules by mouth daily.  . metFORMIN  (GLUCOPHAGE) 1000 MG tablet TAKE 1 TABLET BY MOUTH TWICE DAILY WITH A MEAL  . metoprolol succinate (TOPROL-XL) 50 MG 24 hr tablet Take with or immediately following a meal.  . mirtazapine (REMERON) 30 MG tablet TAKE 1 TABLET(30 MG) BY MOUTH AT BEDTIME  . Omega-3 Fatty Acids (OMEGA 3 500 PO) Take by mouth.  [name] omeprazole (PRILOSEC) 40 MG capsule One daily as needed for heartburn  . ONE TOUCH ULTRA TEST test strip CHECK BLOOD SUGAR DAILY  . ONETOUCH DELICA LANCETS 96V MISC TEST BLOOD SUGAR DAILY  . valACYclovir (VALTREX) 1000 MG tablet TAKE 1 TABLET(1000 MG) BY MOUTH DAILY  . venlafaxine XR (EFFEXOR-XR) 37.5 MG 24 hr capsule TAKE 1 CAPSULE BY MOUTH ONCE DAILY WITH BREAKFAST  . vitamin B-12 (CYANOCOBALAMIN) 1000 MCG tablet Take 1,000 mcg by mouth daily.  . vitamin E 400 UNIT capsule Take 400 Units by mouth 2 (two) times daily.  [name] acetaminophen (TYLENOL) 500 MG tablet Take 2 tablets (1,000 mg  total) by mouth every 8 (eight) hours. (Patient not taking: Reported on 11/06/2019)  . [DISCONTINUED] Suvorexant (BELSOMRA) 10 MG TABS Take 10 mg by mouth at bedtime. (Patient not taking: Reported on 11/06/2019)   No facility-administered medications prior to visit.    Review of Systems  Constitutional: Negative.   Respiratory: Negative.   Cardiovascular: Negative.   Hematological: Negative.       Objective    BP 134/74 (BP Location: Left Arm, Patient Position: Sitting, Cuff Size: Normal)   Pulse 87   Temp 98.1 F (36.7 C) (Oral)   Wt 181 lb 6.4 oz (82.3 kg)   SpO2 96%   BMI 24.60 kg/m    Physical Exam Constitutional:      Appearance: Normal appearance.  Cardiovascular:     Rate and Rhythm: Normal rate and regular rhythm.     Heart sounds: Normal heart sounds.  Pulmonary:     Effort: Pulmonary effort is normal.     Breath sounds: Normal breath sounds.  Skin:    General: Skin is warm and dry.  Neurological:     Mental Status: He is alert and oriented to person, place, and time. Mental  status is at baseline.  Psychiatric:        Mood and Affect: Mood normal.        Behavior: Behavior normal.       No results found for any visits on 01/29/20.  Assessment & Plan    1. Type 2 diabetes mellitus without complication, without long-term current use of insulin ([place])  Uncontrolled with last A1c 8.6% Continue current medications. Add rybelsus, increase to 7 mg daily after one month.  UTD on vaccines, eye exam, foot exam On ACEi On Statin Discussed diet and exercise F/u in 1-2 months  - POCT glycosylated hemoglobin (Hb A1C) - Semaglutide (RYBELSUS) 3 MG TABS; Take one pill daily.  Dispense: 30 tablet; Refill: 0  2. Hyperlipidemia associated with type 2 diabetes mellitus ([place])  Previously well controlled Continue statin Repeat FLP and CMP next visit Goal LDL < 70   3. Hypertension associated with diabetes ([place])  Well controlled Continue current medications Recheck metabolic panel F/u in 1-2 months   4. Insomnia, unspecified type  He is on remeron and effexor. He has been trialed on belsomra however this is unaffordable to him. He is having trouble sleeping and since resumed trazadone. He has been cautioned about serotonin syndrome and to keep trazodone at a low dose.   5. Other depression   6. Influenza vaccination administered at current visit     No follow-ups on file.      ITrinna Post, PA-C, have reviewed all documentation for this visit. The documentation on 01/29/20 for the exam, diagnosis, procedures, and orders are all accurate and complete.  The entirety of the information documented in the History of Present Illness, Review of Systems and Physical Exam were personally obtained by me. Portions of this information were initially documented by [facility] and reviewed by me for thoroughness and accuracy.     Paulene Floor  [facility] [phone] (phone) [phone] (fax)  [place]

## 2020-01-29 ENCOUNTER — Ambulatory Visit (INDEPENDENT_AMBULATORY_CARE_PROVIDER_SITE_OTHER): Payer: Medicare Other | Admitting: Physician Assistant

## 2020-01-29 ENCOUNTER — Other Ambulatory Visit: Payer: Self-pay

## 2020-01-29 ENCOUNTER — Encounter: Payer: Self-pay | Admitting: Physician Assistant

## 2020-01-29 VITALS — BP 134/74 | HR 87 | Temp 98.1°F | Wt 181.4 lb

## 2020-01-29 DIAGNOSIS — E785 Hyperlipidemia, unspecified: Secondary | ICD-10-CM

## 2020-01-29 DIAGNOSIS — E119 Type 2 diabetes mellitus without complications: Secondary | ICD-10-CM

## 2020-01-29 DIAGNOSIS — E1159 Type 2 diabetes mellitus with other circulatory complications: Secondary | ICD-10-CM | POA: Diagnosis not present

## 2020-01-29 DIAGNOSIS — F3289 Other specified depressive episodes: Secondary | ICD-10-CM

## 2020-01-29 DIAGNOSIS — I10 Essential (primary) hypertension: Secondary | ICD-10-CM

## 2020-01-29 DIAGNOSIS — E1169 Type 2 diabetes mellitus with other specified complication: Secondary | ICD-10-CM | POA: Diagnosis not present

## 2020-01-29 DIAGNOSIS — Z23 Encounter for immunization: Secondary | ICD-10-CM | POA: Diagnosis not present

## 2020-01-29 DIAGNOSIS — G4700 Insomnia, unspecified: Secondary | ICD-10-CM | POA: Diagnosis not present

## 2020-01-29 LAB — POCT GLYCOSYLATED HEMOGLOBIN (HGB A1C)
Est. average glucose Bld gHb Est-mCnc: 200
Hemoglobin A1C: 8.6 % — AB (ref 4.0–5.6)

## 2020-01-29 MED ORDER — TRAZODONE HCL 50 MG PO TABS: 25.0000 mg | ORAL_TABLET | Freq: Every evening | ORAL | 1 refills | Status: DC | PRN

## 2020-01-29 MED ORDER — RYBELSUS 3 MG PO TABS: ORAL_TABLET | ORAL | 0 refills | Status: DC

## 2020-02-01 ENCOUNTER — Encounter: Payer: Self-pay | Admitting: Physician Assistant

## 2020-02-01 DIAGNOSIS — E119 Type 2 diabetes mellitus without complications: Secondary | ICD-10-CM

## 2020-02-02 MED ORDER — DAPAGLIFLOZIN PROPANEDIOL 5 MG PO TABS: 5.0000 mg | ORAL_TABLET | Freq: Every day | ORAL | 0 refills | Status: DC

## 2020-02-10 LAB — HM DIABETES EYE EXAM

## 2020-02-23 ENCOUNTER — Other Ambulatory Visit: Payer: Self-pay | Admitting: Physician Assistant

## 2020-02-23 DIAGNOSIS — G43709 Chronic migraine without aura, not intractable, without status migrainosus: Secondary | ICD-10-CM

## 2020-02-23 DIAGNOSIS — F3289 Other specified depressive episodes: Secondary | ICD-10-CM

## 2020-02-25 ENCOUNTER — Other Ambulatory Visit: Payer: BLUE CROSS/BLUE SHIELD

## 2020-02-25 DIAGNOSIS — Z20822 Contact with and (suspected) exposure to covid-19: Secondary | ICD-10-CM

## 2020-02-26 LAB — SARS-COV-2, NAA 2 DAY TAT

## 2020-02-26 LAB — NOVEL CORONAVIRUS, NAA: SARS-CoV-2, NAA: DETECTED — AB

## 2020-02-27 ENCOUNTER — Telehealth: Payer: Self-pay | Admitting: Nurse Practitioner

## 2020-02-27 ENCOUNTER — Ambulatory Visit: Payer: Self-pay | Admitting: Physician Assistant

## 2020-02-27 NOTE — Telephone Encounter (Signed)
Called to Discuss with patient about Covid symptoms and the use of the monoclonal antibody infusion for those with mild to moderate Covid symptoms and at a high risk of hospitalization.     Pt appears to qualify for this infusion due to co-morbid conditions and/or a member of an at-risk group in accordance with the FDA Emergency Use Authorization.    Unable to reach pt- left voicemail and sent message via Mychart.

## 2020-02-29 ENCOUNTER — Other Ambulatory Visit: Payer: Self-pay | Admitting: Nurse Practitioner

## 2020-02-29 ENCOUNTER — Other Ambulatory Visit: Payer: Self-pay

## 2020-02-29 ENCOUNTER — Encounter: Payer: Self-pay | Admitting: Nurse Practitioner

## 2020-02-29 ENCOUNTER — Ambulatory Visit
Admission: EM | Admit: 2020-02-29 | Discharge: 2020-02-29 | Disposition: A | Discharge status: Home / Self Care | Payer: Medicare Other

## 2020-02-29 DIAGNOSIS — E1169 Type 2 diabetes mellitus with other specified complication: Secondary | ICD-10-CM

## 2020-02-29 DIAGNOSIS — E785 Hyperlipidemia, unspecified: Secondary | ICD-10-CM

## 2020-02-29 DIAGNOSIS — E1159 Type 2 diabetes mellitus with other circulatory complications: Secondary | ICD-10-CM

## 2020-02-29 DIAGNOSIS — E119 Type 2 diabetes mellitus without complications: Secondary | ICD-10-CM

## 2020-02-29 DIAGNOSIS — U071 COVID-19: Secondary | ICD-10-CM

## 2020-02-29 DIAGNOSIS — R5382 Chronic fatigue, unspecified: Secondary | ICD-10-CM

## 2020-02-29 NOTE — Progress Notes (Signed)
I connected by phone with Billy Hardin on 02/29/2020 at 3:56 PM to discuss the potential use of a new treatment for mild to moderate COVID-19 viral infection in non-hospitalized patients.  This patient is a 69 y.o. male that meets the FDA criteria for Emergency Use Authorization of COVID monoclonal antibody casirivimab/imdevimab or bamlanivimab/eteseviamb.  Has a (+) direct SARS-CoV-2 viral test result  Has mild or moderate COVID-19   Is NOT hospitalized due to COVID-19  Is within 10 days of symptom onset  Has at least one of the high risk factor(s) for progression to severe COVID-19 and/or hospitalization as defined in EUA.  Specific high risk criteria : Older age (>/= 69 yo), BMI > 25 and Diabetes, HTN  Sx onset 10/12    I have spoken and communicated the following to the patient or parent/caregiver regarding COVID monoclonal antibody treatment:  1. FDA has authorized the emergency use for the treatment of mild to moderate COVID-19 in adults and pediatric patients with positive results of direct SARS-CoV-2 viral testing who are 21 years of age and older weighing at least 40 kg, and who are at high risk for progressing to severe COVID-19 and/or hospitalization.  2. The significant known and potential risks and benefits of COVID monoclonal antibody, and the extent to which such potential risks and benefits are unknown.  3. Information on available alternative treatments and the risks and benefits of those alternatives, including clinical trials.  4. Patients treated with COVID monoclonal antibody should continue to self-isolate and use infection control measures (e.g., wear mask, isolate, social distance, avoid sharing personal items, clean and disinfect "high touch" surfaces, and frequent handwashing) according to CDC guidelines.   5. The patient or parent/caregiver has the option to accept or refuse COVID monoclonal antibody treatment.  After reviewing this information with the  patient, the patient has agreed to receive one of the available covid 19 monoclonal antibodies and will be provided an appropriate fact sheet prior to infusion. Orma Render, NP 02/29/2020 3:56 PM

## 2020-02-29 NOTE — ED Triage Notes (Signed)
Pt c/o nasal congestion, headache, runny nose, fever (103). Started about 3 days ago. He tested positive for covid

## 2020-03-01 ENCOUNTER — Ambulatory Visit (HOSPITAL_COMMUNITY)
Admission: RE | Admit: 2020-03-01 | Discharge: 2020-03-01 | Disposition: A | Discharge status: Home / Self Care | Payer: Medicare Other | Source: Ambulatory Visit | Attending: Pulmonary Disease | Admitting: Pulmonary Disease

## 2020-03-01 DIAGNOSIS — Z23 Encounter for immunization: Secondary | ICD-10-CM | POA: Insufficient documentation

## 2020-03-01 DIAGNOSIS — U071 COVID-19: Secondary | ICD-10-CM | POA: Diagnosis present

## 2020-03-01 DIAGNOSIS — R5382 Chronic fatigue, unspecified: Secondary | ICD-10-CM | POA: Diagnosis present

## 2020-03-01 DIAGNOSIS — E1159 Type 2 diabetes mellitus with other circulatory complications: Secondary | ICD-10-CM | POA: Insufficient documentation

## 2020-03-01 DIAGNOSIS — E785 Hyperlipidemia, unspecified: Secondary | ICD-10-CM | POA: Insufficient documentation

## 2020-03-01 DIAGNOSIS — E1169 Type 2 diabetes mellitus with other specified complication: Secondary | ICD-10-CM | POA: Diagnosis present

## 2020-03-01 DIAGNOSIS — E119 Type 2 diabetes mellitus without complications: Secondary | ICD-10-CM | POA: Insufficient documentation

## 2020-03-01 DIAGNOSIS — I152 Hypertension secondary to endocrine disorders: Secondary | ICD-10-CM | POA: Diagnosis present

## 2020-03-01 MED ORDER — SODIUM CHLORIDE 0.9 % IV SOLN: Freq: Once | INTRAVENOUS | Status: AC

## 2020-03-01 MED ORDER — METHYLPREDNISOLONE SODIUM SUCC 125 MG IJ SOLR: 125.0000 mg | Freq: Once | INTRAMUSCULAR | Status: DC | PRN

## 2020-03-01 MED ORDER — DIPHENHYDRAMINE HCL 50 MG/ML IJ SOLN: 50.0000 mg | Freq: Once | INTRAMUSCULAR | Status: DC | PRN

## 2020-03-01 MED ORDER — EPINEPHRINE 0.3 MG/0.3ML IJ SOAJ: 0.3000 mg | Freq: Once | INTRAMUSCULAR | Status: DC | PRN

## 2020-03-01 MED ORDER — ALBUTEROL SULFATE HFA 108 (90 BASE) MCG/ACT IN AERS: 2.0000 | INHALATION_SPRAY | Freq: Once | RESPIRATORY_TRACT | Status: DC | PRN

## 2020-03-01 MED ORDER — FAMOTIDINE IN NACL 20-0.9 MG/50ML-% IV SOLN: 20.0000 mg | Freq: Once | INTRAVENOUS | Status: DC | PRN

## 2020-03-01 MED ORDER — SODIUM CHLORIDE 0.9 % IV SOLN: INTRAVENOUS | Status: DC | PRN

## 2020-03-01 NOTE — Progress Notes (Signed)
  Diagnosis: COVID-19  Physician:Dr Wright  Procedure: Covid Infusion Clinic Med: bamlanivimab\etesevimab infusion - Provided patient with bamlanimivab\etesevimab fact sheet for patients, parents and caregivers prior to infusion.  Complications: No immediate complications noted.  Discharge: Discharged home   Kevontae Burgoon Apple 03/01/2020  

## 2020-03-01 NOTE — Discharge Instructions (Signed)

## 2020-03-02 ENCOUNTER — Other Ambulatory Visit: Payer: Self-pay | Admitting: Physician Assistant

## 2020-03-02 DIAGNOSIS — E119 Type 2 diabetes mellitus without complications: Secondary | ICD-10-CM

## 2020-03-16 ENCOUNTER — Encounter: Payer: Self-pay | Admitting: Emergency Medicine

## 2020-03-16 ENCOUNTER — Ambulatory Visit
Admission: EM | Admit: 2020-03-16 | Discharge: 2020-03-16 | Disposition: A | Discharge status: Home / Self Care | Payer: Medicare Other | Attending: Emergency Medicine | Admitting: Emergency Medicine

## 2020-03-16 ENCOUNTER — Other Ambulatory Visit: Payer: Self-pay

## 2020-03-16 DIAGNOSIS — S39012A Strain of muscle, fascia and tendon of lower back, initial encounter: Secondary | ICD-10-CM

## 2020-03-16 MED ORDER — METHOCARBAMOL 500 MG PO TABS: 500.0000 mg | ORAL_TABLET | Freq: Two times a day (BID) | ORAL | 0 refills | Status: DC

## 2020-03-16 MED ORDER — IBUPROFEN 600 MG PO TABS: 600.0000 mg | ORAL_TABLET | Freq: Four times a day (QID) | ORAL | 0 refills | Status: DC | PRN

## 2020-03-16 NOTE — ED Triage Notes (Signed)
Patient states he pulled a muscle in his back on Saturday when he was working on something.

## 2020-03-16 NOTE — ED Provider Notes (Signed)
[facility]    CSN: [id number] Arrival date & time: 03/16/20  1118      History   Chief Complaint Chief Complaint  Patient presents with  . Back Pain    HPI Billy Hardin is a 69 y.o. male.   69 year old gentleman here for evaluation of left low back pain.  Patient reports that he was working on a wooden box and when he went to flip it over 3 days ago he felt a sharp pain in his left low back.  Movement makes the pain worse, especially when transitioning from sitting to standing or vice versa.  Rest makes the pain better.  He has been using ice and Tylenol at home which has not been helping.  He says that he feels the pain radiate to the top of his left thigh.  Patient states that at first he wondered if he had jarred a kidney stone loose but denies blood in the urine, frequency, urgency, sweats, nausea, or radiation of the pain.     Past Medical History:  Diagnosis Date  . Chronic migraine without aura without status migrainosus, not intractable 05/30/2016  . Complication of anesthesia    had catheter 4 days unable to void  . Deaf    Rt ear  . Diabetes mellitus without complication ([place])   . Elevated lipids   . Herpes   . Insomnia   . Seasonal allergies   . TMJ (dislocation of temporomandibular joint)   . Uses hearing aid    Left    Patient Active Problem List   Diagnosis Date Noted  . Hypertension associated with diabetes ([place]) 08/12/2018  . Hyperlipidemia associated with type 2 diabetes mellitus ([place]) 08/12/2018  . History of migraine headaches 04/29/2018  . Chronic fatigue, unspecified 02/08/2017  . Depression 05/30/2016  . Male hypogonadism 05/25/2015  . Genital herpes 05/25/2015  . Situational stress 05/25/2015  . History of kidney stones 05/25/2015  . Diabetes mellitus type 2, uncomplicated ([place]) 42/59/5638  . ASNHL (asymmetrical sensorineural hearing loss) 09/15/2014  . H/O adenomatous polyp of colon 08/07/2013  . Insomnia 01/21/2008  .  Family history of cardiovascular disease 02/04/2007    Past Surgical History:  Procedure Laterality Date  . KIDNEY STONE SURGERY    . MIDDLE EAR SURGERY    . PENILE PROSTHESIS IMPLANT N/A 11/03/2014   Procedure: PENILE PROTHESIS INFLATABLE;  Surgeon: Royston Cowper, MD;  Location: [facility];  Service: Urology;  Laterality: N/A;  . TONSILLECTOMY AND ADENOIDECTOMY         Home Medications    Prior to Admission medications   Medication Sig Start Date End Date Taking? Authorizing Provider  acetaminophen (TYLENOL) 500 MG tablet Take 2 tablets (1,000 mg total) by mouth every 8 (eight) hours. 05/02/19 05/01/20 Yes Leim Fabry, MD  atorvastatin (LIPITOR) 20 MG tablet Take 1 tablet by mouth once daily 01/25/20  Yes Pollak, Adriana M, PA-C  blood glucose meter kit and supplies KIT Dispense based on patient and insurance preference. Check sugar daily 07/01/15  Yes Carmon Ginsberg, [place]  cholecalciferol (VITAMIN D) 400 units TABS tablet Take 400 Units by mouth.   Yes [provider]  CINNAMON PO Take 2 capsules by mouth daily.   Yes [provider]  dapagliflozin propanediol (FARXIGA) 5 MG TABS tablet Take 1 tablet (5 mg total) by mouth daily before breakfast. 02/02/20  Yes Pollak, Adriana M, PA-C  metFORMIN (GLUCOPHAGE) 1000 MG tablet TAKE 1 TABLET BY MOUTH TWICE DAILY WITH A  MEAL 03/02/20  Yes Carles Collet M, PA-C  metoprolol succinate (TOPROL-XL) 50 MG 24 hr tablet TAKE 1 TABLET BY MOUTH ONCE DAILY WITH  OR  IMMEDIATELY  FOLLOWING  A  MEAL 02/23/20  Yes Terrilee Croak, Adriana M, PA-C  mirtazapine (REMERON) 30 MG tablet TAKE 1 TABLET(30 MG) BY MOUTH AT BEDTIME 08/27/19  Yes Pollak, Adriana M, PA-C  Omega-3 Fatty Acids (OMEGA 3 500 PO) Take by mouth.   Yes [provider]  omeprazole (PRILOSEC) 40 MG capsule One daily as needed for heartburn 11/01/17  Yes Carmon Ginsberg, PA  ONE TOUCH ULTRA TEST test strip CHECK BLOOD SUGAR DAILY 06/10/16  Yes Carmon Ginsberg, PA  ONETOUCH DELICA  LANCETS 20E MISC TEST BLOOD SUGAR DAILY 07/15/16  Yes Carmon Ginsberg, PA  Semaglutide (RYBELSUS) 3 MG TABS Take one pill daily. 01/29/20  Yes Trinna Post, PA-C  traZODone (DESYREL) 50 MG tablet Take 0.5-1 tablets (25-50 mg total) by mouth at bedtime as needed for sleep. 01/29/20  Yes Trinna Post, PA-C  valACYclovir (VALTREX) 1000 MG tablet TAKE 1 TABLET(1000 MG) BY MOUTH DAILY 11/06/19  Yes Carles Collet M, PA-C  venlafaxine XR (EFFEXOR-XR) 37.5 MG 24 hr capsule TAKE 1 CAPSULE BY MOUTH ONCE DAILY WITH BREAKFAST 02/23/20  Yes Carles Collet M, PA-C  vitamin B-12 (CYANOCOBALAMIN) 1000 MCG tablet Take 1,000 mcg by mouth daily.   Yes [provider]  vitamin E 400 UNIT capsule Take 400 Units by mouth 2 (two) times daily.   Yes [provider]  ibuprofen (ADVIL) 600 MG tablet Take 1 tablet (600 mg total) by mouth every 6 (six) hours as needed. 03/16/20   Margarette Canada, NP  methocarbamol (ROBAXIN) 500 MG tablet Take 1 tablet (500 mg total) by mouth 2 (two) times daily. 03/16/20   Margarette Canada, NP    Family History Family History  Problem Relation Age of Onset  . Cancer Mother   . CAD Brother     Social History Social History   Tobacco Use  . Smoking status: Never Smoker  . Smokeless tobacco: Never Used  Vaping Use  . Vaping Use: Never used  Substance Use Topics  . Alcohol use: Yes    Alcohol/week: 0.0 standard drinks    Comment: 1-2 drinks total of 2-3x a year  . Drug use: No     Allergies   Bupropion, Sertraline, and Penicillins   Review of Systems Review of Systems  Constitutional: Negative for activity change, appetite change, diaphoresis and fever.  Gastrointestinal: Negative for nausea and vomiting.  Genitourinary: Negative for flank pain, frequency, hematuria, testicular pain and urgency.  Musculoskeletal: Positive for back pain and myalgias. Negative for arthralgias.  Hematological: Negative.   Psychiatric/Behavioral: Negative.      Physical  Exam Triage Vital Signs ED Triage Vitals  Enc Vitals Group     BP 03/16/20 1137 104/62     Pulse Rate 03/16/20 1137 89     Resp 03/16/20 1137 18     Temp 03/16/20 1137 98 F (36.7 C)     Temp Source 03/16/20 1137 Oral     SpO2 03/16/20 1137 100 %     Weight 03/16/20 1136 181 lb 7 oz (82.3 kg)     Height 03/16/20 1136 6' (1.829 m)     Head Circumference --      Peak Flow --      Pain Score 03/16/20 1136 7     Pain Loc --      Pain Edu? --  Excl. in GC? --    No data found.  Updated Vital Signs BP 104/62 (BP Location: Right Arm)   Pulse 89   Temp 98 F (36.7 C) (Oral)   Resp 18   Ht 6' (1.829 m)   Wt 181 lb 7 oz (82.3 kg)   SpO2 100%   BMI 24.61 kg/m   Visual Acuity Right Eye Distance:   Left Eye Distance:   Bilateral Distance:    Right Eye Near:   Left Eye Near:    Bilateral Near:     Physical Exam Vitals and nursing note reviewed.  Constitutional:      General: He is in acute distress.     Appearance: Normal appearance. He is not ill-appearing, toxic-appearing or diaphoretic.     Comments: Patient appears in pain.  HENT:     Head: Normocephalic and atraumatic.  Eyes:     General: No scleral icterus.    Extraocular Movements: Extraocular movements intact.     Conjunctiva/sclera: Conjunctivae normal.     Pupils: Pupils are equal, round, and reactive to light.  Cardiovascular:     Rate and Rhythm: Normal rate and regular rhythm.     Pulses: Normal pulses.     Heart sounds: Normal heart sounds. No murmur heard.  No gallop.   Pulmonary:     Effort: Pulmonary effort is normal.     Breath sounds: Normal breath sounds. No wheezing, rhonchi or rales.  Musculoskeletal:        General: Tenderness and signs of injury present. No swelling. Normal range of motion.     Cervical back: Normal range of motion and neck supple. No tenderness.     Comments: Patient has tenderness to his left low back.  There is spasm and the body of the latissimus dorsi.  There is no  erythema, edema, or ecchymosis noted in the overlying skin.  Pain increases with flexion and extension as well as lateral rotation.  Skin:    General: Skin is warm and dry.     Capillary Refill: Capillary refill takes less than 2 seconds.     Findings: No bruising or erythema.  Neurological:     General: No focal deficit present.     Mental Status: He is alert and oriented to person, place, and time.  Psychiatric:        Mood and Affect: Mood normal.        Behavior: Behavior normal.        Thought Content: Thought content normal.        Judgment: Judgment normal.      UC Treatments / Results  Labs (all labs ordered are listed, but only abnormal results are displayed) Labs Reviewed - No data to display  EKG   Radiology No results found.  Procedures Procedures (including critical care time)  Medications Ordered in UC Medications - No data to display  Initial Impression / Assessment and Plan / UC Course  I have reviewed the triage vital signs and the nursing notes.  Pertinent labs & imaging results that were available during my care of the patient were reviewed by me and considered in my medical decision making (see chart for details).   Patient is coming in for evaluation of left low back pain after flipping a wooden box over 3 days ago.  Patient has tenderness and spasm to his left low back.  He has been using Tylenol and ice which has not been helping.  Physical exam consistent with  low back strain.  Will treat with NSAIDs, muscle relaxers, moist heat, and home physical therapy.  To follow-up with his PCP for continued symptoms.   Final Clinical Impressions(s) / UC Diagnoses   Final diagnoses:  Strain of lumbar region, initial encounter     Discharge Instructions     Take the ibuprofen every 6 hours with food on a scheduled for the next 2 days and then on an as-needed basis.  Take that with the methocarbamol every 6 hours on a schedule for the next 2 days and  then as needed.  Use moist heat on your low back.  The easiest way to do this is to wet a hand towel under warm tap water, wring it out, and apply it to your back with a heating pad over top.  Do not leave it on for longer than 20 minutes at a time.  You can do this 2-3 times a day.  All of the back exercises given to you in your discharge paperwork.  If your symptoms continue then you need to follow-up with your primary care provider.    ED Prescriptions    Medication Sig Dispense Auth. Provider   methocarbamol (ROBAXIN) 500 MG tablet Take 1 tablet (500 mg total) by mouth 2 (two) times daily. 20 tablet Margarette Canada, NP   ibuprofen (ADVIL) 600 MG tablet Take 1 tablet (600 mg total) by mouth every 6 (six) hours as needed. 30 tablet Margarette Canada, NP     PDMP not reviewed this encounter.   Margarette Canada, NP 03/16/20 1216

## 2020-03-16 NOTE — Discharge Instructions (Addendum)
Take the ibuprofen every 6 hours with food on a scheduled for the next 2 days and then on an as-needed basis.  Take that with the methocarbamol every 6 hours on a schedule for the next 2 days and then as needed.  Use moist heat on your low back.  The easiest way to do this is to wet a hand towel under warm tap water, wring it out, and apply it to your back with a heating pad over top.  Do not leave it on for longer than 20 minutes at a time.  You can do this 2-3 times a day.  All of the back exercises given to you in your discharge paperwork.  If your symptoms continue then you need to follow-up with your primary care provider.

## 2020-03-31 ENCOUNTER — Other Ambulatory Visit: Payer: Self-pay

## 2020-03-31 ENCOUNTER — Emergency Department
Admission: EM | Admit: 2020-03-31 | Discharge: 2020-03-31 | Disposition: A | Discharge status: Home / Self Care | Payer: Medicare Other | Attending: Emergency Medicine | Admitting: Emergency Medicine

## 2020-03-31 DIAGNOSIS — I10 Essential (primary) hypertension: Secondary | ICD-10-CM | POA: Insufficient documentation

## 2020-03-31 DIAGNOSIS — S61214A Laceration without foreign body of right ring finger without damage to nail, initial encounter: Secondary | ICD-10-CM | POA: Diagnosis not present

## 2020-03-31 DIAGNOSIS — W268XXA Contact with other sharp object(s), not elsewhere classified, initial encounter: Secondary | ICD-10-CM | POA: Diagnosis not present

## 2020-03-31 DIAGNOSIS — Z79899 Other long term (current) drug therapy: Secondary | ICD-10-CM | POA: Insufficient documentation

## 2020-03-31 DIAGNOSIS — Z7984 Long term (current) use of oral hypoglycemic drugs: Secondary | ICD-10-CM | POA: Diagnosis not present

## 2020-03-31 DIAGNOSIS — S61216A Laceration without foreign body of right little finger without damage to nail, initial encounter: Secondary | ICD-10-CM | POA: Diagnosis not present

## 2020-03-31 DIAGNOSIS — S61212A Laceration without foreign body of right middle finger without damage to nail, initial encounter: Secondary | ICD-10-CM | POA: Insufficient documentation

## 2020-03-31 DIAGNOSIS — Y990 Civilian activity done for income or pay: Secondary | ICD-10-CM | POA: Diagnosis not present

## 2020-03-31 DIAGNOSIS — E119 Type 2 diabetes mellitus without complications: Secondary | ICD-10-CM | POA: Diagnosis not present

## 2020-03-31 MED ORDER — SULFAMETHOXAZOLE-TRIMETHOPRIM 800-160 MG PO TABS: 1.0000 | ORAL_TABLET | Freq: Two times a day (BID) | ORAL | 0 refills | Status: DC

## 2020-03-31 MED ORDER — LIDOCAINE HCL (PF) 1 % IJ SOLN
20.0000 mL | Freq: Once | INTRAMUSCULAR | Status: AC
  Administered 2020-03-31: 20 mL
  Filled 2020-03-31: qty 20

## 2020-03-31 NOTE — ED Triage Notes (Signed)
Lacerations to right hand 3-5th digits today with piece of wire. Bleeding controlled. Pt alert and oriented X4, cooperative, RR even and unlabored, color WNL. Pt in NAD.

## 2020-03-31 NOTE — ED Provider Notes (Signed)
[facility] Emergency Department Provider Note  ____________________________________________  Time seen: Approximately 3:16 PM  I have reviewed the triage vital signs and the nursing notes.   HISTORY  Chief Complaint Laceration    HPI Billy Hardin is a 69 y.o. male who presents the emergency department complaining of lacerations to the third, fourth, fifth digits of the right hand.  Patient states that he was using a grinding wheel to cut small branches out of a tree.  Patient states that he did not see a metal wire that was in 1 of a tree branches and when the grinding wheel struck the wire it caused the wire to work around striking his hand.  Patient sustained lacerations to the dorsal aspect of the third, fourth, fifth digits.  Full range of motion intact to all digits.  On review of patient's medical records in epic, his last tetanus shot was 2 years ago though patient does not remember his last tetanus.  Patient with no bleeding or clotting disorders.  No other complaints at this time.  No medications prior to arrival.  Medical history as described below with no complaints with chronic medical issues.        Past Medical History:  Diagnosis Date  . Chronic migraine without aura without status migrainosus, not intractable 05/30/2016  . Complication of anesthesia    had catheter 4 days unable to void  . Deaf    Rt ear  . Diabetes mellitus without complication ([place])   . Elevated lipids   . Herpes   . Insomnia   . Seasonal allergies   . TMJ (dislocation of temporomandibular joint)   . Uses hearing aid    Left    Patient Active Problem List   Diagnosis Date Noted  . Hypertension associated with diabetes ([place]) 08/12/2018  . Hyperlipidemia associated with type 2 diabetes mellitus ([place]) 08/12/2018  . History of migraine headaches 04/29/2018  . Chronic fatigue, unspecified 02/08/2017  . Depression 05/30/2016  . Male hypogonadism 05/25/2015  .  Genital herpes 05/25/2015  . Situational stress 05/25/2015  . History of kidney stones 05/25/2015  . Diabetes mellitus type 2, uncomplicated ([place]) 64/33/2951  . ASNHL (asymmetrical sensorineural hearing loss) 09/15/2014  . H/O adenomatous polyp of colon 08/07/2013  . Insomnia 01/21/2008  . Family history of cardiovascular disease 02/04/2007    Past Surgical History:  Procedure Laterality Date  . KIDNEY STONE SURGERY    . MIDDLE EAR SURGERY    . PENILE PROSTHESIS IMPLANT N/A 11/03/2014   Procedure: PENILE PROTHESIS INFLATABLE;  Surgeon: Royston Cowper, MD;  Location: [facility] ORS;  Service: Urology;  Laterality: N/A;  . TONSILLECTOMY AND ADENOIDECTOMY      Prior to Admission medications   Medication Sig Start Date End Date Taking? Authorizing Provider  acetaminophen (TYLENOL) 500 MG tablet Take 2 tablets (1,000 mg total) by mouth every 8 (eight) hours. 05/02/19 05/01/20  Leim Fabry, MD  atorvastatin (LIPITOR) 20 MG tablet Take 1 tablet by mouth once daily 01/25/20   Trinna Post, PA-C  blood glucose meter kit and supplies KIT Dispense based on patient and insurance preference. Check sugar daily 07/01/15   Carmon Ginsberg, PA  cholecalciferol (VITAMIN D) 400 units TABS tablet Take 400 Units by mouth.    [provider]  CINNAMON PO Take 2 capsules by mouth daily.    [provider]  dapagliflozin propanediol (FARXIGA) 5 MG TABS tablet Take 1 tablet (5 mg total) by mouth daily before breakfast.  02/02/20   Trinna Post, PA-C  ibuprofen (ADVIL) 600 MG tablet Take 1 tablet (600 mg total) by mouth every 6 (six) hours as needed. 03/16/20   Margarette Canada, NP  metFORMIN (GLUCOPHAGE) 1000 MG tablet TAKE 1 TABLET BY MOUTH TWICE DAILY WITH A MEAL 03/02/20   Carles Collet M, PA-C  methocarbamol (ROBAXIN) 500 MG tablet Take 1 tablet (500 mg total) by mouth 2 (two) times daily. 03/16/20   Margarette Canada, NP  metoprolol succinate (TOPROL-XL) 50 MG 24 hr tablet TAKE 1 TABLET BY MOUTH  ONCE DAILY WITH  OR  IMMEDIATELY  FOLLOWING  A  MEAL 02/23/20   Carles Collet M, PA-C  mirtazapine (REMERON) 30 MG tablet TAKE 1 TABLET(30 MG) BY MOUTH AT BEDTIME 08/27/19   Trinna Post, PA-C  Omega-3 Fatty Acids (OMEGA 3 500 PO) Take by mouth.    [provider]  omeprazole (PRILOSEC) 40 MG capsule One daily as needed for heartburn 11/01/17   Carmon Ginsberg, PA  ONE TOUCH ULTRA TEST test strip CHECK BLOOD SUGAR DAILY 06/10/16   Carmon Ginsberg, PA  [facility] DELICA LANCETS 53I MISC TEST BLOOD SUGAR DAILY 07/15/16   Carmon Ginsberg, PA  Semaglutide (RYBELSUS) 3 MG TABS Take one pill daily. 01/29/20   Trinna Post, PA-C  sulfamethoxazole-trimethoprim (BACTRIM DS) 800-160 MG tablet Take 1 tablet by mouth 2 (two) times daily. 03/31/20   Zayneb Baucum, Charline Bills, PA-C  traZODone (DESYREL) 50 MG tablet Take 0.5-1 tablets (25-50 mg total) by mouth at bedtime as needed for sleep. 01/29/20   Trinna Post, PA-C  valACYclovir (VALTREX) 1000 MG tablet TAKE 1 TABLET(1000 MG) BY MOUTH DAILY 11/06/19   Trinna Post, PA-C  venlafaxine XR (EFFEXOR-XR) 37.5 MG 24 hr capsule TAKE 1 CAPSULE BY MOUTH ONCE DAILY WITH BREAKFAST 02/23/20   Carles Collet M, PA-C  vitamin B-12 (CYANOCOBALAMIN) 1000 MCG tablet Take 1,000 mcg by mouth daily.    [provider]  vitamin E 400 UNIT capsule Take 400 Units by mouth 2 (two) times daily.    [provider]    Allergies Bupropion, Sertraline, and Penicillins  Family History  Problem Relation Age of Onset  . Cancer Mother   . CAD Brother     Social History Social History   Tobacco Use  . Smoking status: Never Smoker  . Smokeless tobacco: Never Used  Vaping Use  . Vaping Use: Never used  Substance Use Topics  . Alcohol use: Yes    Alcohol/week: 0.0 standard drinks    Comment: 1-2 drinks total of 2-3x a year  . Drug use: No     Review of Systems  Constitutional: No fever/chills Eyes: No visual changes. No discharge ENT:  No upper respiratory complaints. Cardiovascular: no chest pain. Respiratory: no cough. No SOB. Gastrointestinal: No abdominal pain.  No nausea, no vomiting.  No diarrhea.  No constipation. Musculoskeletal: Lacerations to the third, fourth, fifth digits right hand Skin: Negative for rash, abrasions, lacerations, ecchymosis. Neurological: Negative for headaches, focal weakness or numbness.  10 System ROS otherwise negative.  ____________________________________________   PHYSICAL EXAM:  VITAL SIGNS: ED Triage Vitals  Enc Vitals Group     BP 03/31/20 1435 (!) 135/105     Pulse Rate 03/31/20 1435 (!) 104     Resp 03/31/20 1435 16     Temp 03/31/20 1435 98.5 F (36.9 C)     Temp Source 03/31/20 1435 Oral     SpO2 03/31/20 1435 96 %  Weight 03/31/20 1436 180 lb 12.4 oz (82 kg)     Height 03/31/20 1436 6' (1.829 m)     Head Circumference --      Peak Flow --      Pain Score 03/31/20 1435 5     Pain Loc --      Pain Edu? --      Excl. in [place]? --      Constitutional: Alert and oriented. Well appearing and in no acute distress. Eyes: Conjunctivae are normal. PERRL. EOMI. Head: Atraumatic. ENT:      Ears:       Nose: No congestion/rhinnorhea.      Mouth/Throat: Mucous membranes are moist.  Neck: No stridor.    Cardiovascular: Normal rate, regular rhythm. Normal S1 and S2.  Good peripheral circulation. Respiratory: Normal respiratory effort without tachypnea or retractions. Lungs CTAB. Good air entry to the bases with no decreased or absent breath sounds. Musculoskeletal: Full range of motion to all extremities. No gross deformities appreciated.  Visualization of the right hand reveals lacerations left third, fourth, fifth digits along the dorsal aspect.  No active bleeding.  No visible foreign body to a laceration.  Laceration to the middle finger measures approximately 1.5 cm range of motion to the digit.  Sensation capillary refill intact.  Examination of the fourth digit  reveals laceration measuring approximately 2 cm.  No active bleeding.  No visible foreign body.  Full range of motion to the digit.  Sensation capillary refill intact to the digit.  Examination of the fifth digit reveals a laceration measuring approximately 1.5 cm.  No active bleeding.  No visible foreign body.  Full range of motion to the digit.  Sensation capillary refill intact. Neurologic:  Normal speech and language. No gross focal neurologic deficits are appreciated.  Skin:  Skin is warm, dry and intact. No rash noted. Psychiatric: Mood and affect are normal. Speech and behavior are normal. Patient exhibits appropriate insight and judgement.   ____________________________________________   LABS (all labs ordered are listed, but only abnormal results are displayed)  Labs Reviewed - No data to display ____________________________________________  EKG   ____________________________________________  RADIOLOGY   No results found.  ____________________________________________    PROCEDURES  Procedure(s) performed:    Marland KitchenMarland KitchenLaceration Repair  Date/Time: 03/31/2020 5:37 PM Performed by: Darletta Moll, PA-C Authorized by: Darletta Moll, PA-C   Consent:    Consent obtained:  Verbal   Consent given by:  Patient   Risks discussed:  Pain, poor wound healing and infection Anesthesia (see MAR for exact dosages):    Anesthesia method:  Nerve block   Block location:  Middle finger R hand   Block needle gauge:  27 G   Block anesthetic:  Lidocaine 1% w/o epi   Block technique:  Digital block   Block injection procedure:  Anatomic landmarks identified, introduced needle, negative aspiration for blood and incremental injection   Block outcome:  Anesthesia achieved Laceration details:    Location:  Finger   Finger location:  R long finger   Length (cm):  1.5 Repair type:    Repair type:  Complex Pre-procedure details:    Preparation:  Patient was prepped and  draped in usual sterile fashion Exploration:    Hemostasis achieved with:  Direct pressure   Wound exploration: wound explored through full range of motion and entire depth of wound probed and visualized     Wound extent: no foreign bodies/material noted, no muscle damage noted, no nerve  damage noted, no tendon damage noted, no underlying fracture noted and no vascular damage noted   Treatment:    Area cleansed with:  Betadine and saline   Amount of cleaning:  Extensive   Irrigation solution:  Sterile saline   Irrigation volume:  500 ml   Irrigation method:  Syringe Skin repair:    Repair method:  Sutures   Suture size:  4-0   Suture material:  Nylon   Suture technique:  Running locked   Number of sutures:  1 (1 running interlocked suture with 9 throws) Approximation:    Approximation:  Close Post-procedure details:    Dressing:  Open (no dressing)   Patient tolerance of procedure:  Tolerated well, no immediate complications Comments:     Ragged edges of the laceration required trimming with scissors to provide smooth edges.  Patient had a flap of tissue that was removed to ensure better closure.  After this was performed, edges of the wound were approximated using 4-0 nylon sutures in a running interlocked pattern with 9 throws. [name].Laceration Repair  Date/Time: 03/31/2020 5:40 PM Performed by: Darletta Moll, PA-C Authorized by: Darletta Moll, PA-C   Consent:    Consent obtained:  Verbal   Consent given by:  Patient   Risks discussed:  Pain, poor wound healing and infection Anesthesia (see MAR for exact dosages):    Anesthesia method:  Nerve block   Block location:  Middle finger R hand   Block needle gauge:  27 G   Block anesthetic:  Lidocaine 1% w/o epi   Block technique:  Digital block   Block injection procedure:  Anatomic landmarks identified, introduced needle, negative aspiration for blood and incremental injection   Block outcome:  Anesthesia  achieved Laceration details:    Location:  Finger   Finger location:  R ring finger   Length (cm):  2 Repair type:    Repair type:  Complex Pre-procedure details:    Preparation:  Patient was prepped and draped in usual sterile fashion and imaging obtained to evaluate for foreign bodies Exploration:    Limited defect created (wound extended): yes     Hemostasis achieved with:  Direct pressure   Wound exploration: wound explored through full range of motion and entire depth of wound probed and visualized     Wound extent: no foreign bodies/material noted, no muscle damage noted, no nerve damage noted, no tendon damage noted, no underlying fracture noted and no vascular damage noted   Treatment:    Area cleansed with:  Betadine and saline   Amount of cleaning:  Extensive   Irrigation solution:  Sterile saline   Irrigation volume:  505 ml   Irrigation method:  Syringe Skin repair:    Repair method:  Sutures   Suture size:  4-0   Suture material:  Nylon   Suture technique:  Running locked   Number of sutures:  1 (1 running interlock suture with 8 throws) Approximation:    Approximation:  Close Post-procedure details:    Dressing:  Open (no dressing)   Patient tolerance of procedure:  Tolerated well, no immediate complications Comments:     Patient's laceration was ragged in nature.  Patient had a history of skin tingling from the end of the wound that was removed with scissors.  Edges were trimmed using scissors to provide better closure.  After this was performed, edges were approximated using 4-0 nylon sutures in a running interlock pattern with a throws. [name].Laceration Repair  Date/Time: 03/31/2020 5:43 PM Performed by: Darletta Moll, PA-C Authorized by: Darletta Moll, PA-C   Consent:    Consent obtained:  Verbal   Consent given by:  Patient   Risks discussed:  Pain Anesthesia (see MAR for exact dosages):    Anesthesia method:  Nerve block   Block location:   Pinky finger R hand   Block needle gauge:  27 G   Block anesthetic:  Lidocaine 1% w/o epi   Block technique:  Digital blcok   Block injection procedure:  Anatomic landmarks identified, introduced needle, negative aspiration for blood and incremental injection   Block outcome:  Anesthesia achieved Laceration details:    Location:  Finger   Finger location:  R small finger   Length (cm):  2.5 Repair type:    Repair type:  Intermediate Pre-procedure details:    Preparation:  Patient was prepped and draped in usual sterile fashion Exploration:    Wound exploration: wound explored through full range of motion and entire depth of wound probed and visualized     Wound extent: no foreign bodies/material noted, no nerve damage noted, no tendon damage noted, no underlying fracture noted and no vascular damage noted   Treatment:    Area cleansed with:  Betadine and saline   Amount of cleaning:  Extensive   Irrigation solution:  Sterile saline   Irrigation volume:  750 ml   Irrigation method:  Syringe Skin repair:    Repair method:  Sutures   Suture size:  4-0   Suture material:  Nylon   Suture technique:  Running locked   Number of sutures:  1 (1 running interlock suture with 12 throws) Approximation:    Approximation:  Close Post-procedure details:    Dressing:  Open (no dressing)   Patient tolerance of procedure:  Tolerated well, no immediate complications      Medications  lidocaine (PF) (XYLOCAINE) 1 % injection 20 mL (has no administration in time range)     ____________________________________________   INITIAL IMPRESSION / ASSESSMENT AND PLAN / ED COURSE  Pertinent labs & imaging results that were available during my care of the patient were reviewed by me and considered in my medical decision making (see chart for details).  Review of the [place] CSRS was performed in accordance of the [place] prior to dispensing any controlled drugs.           Patient's diagnosis is  consistent with lacerations to the middle, ring, index finger of the right hand.  Patient was using a tool to trim branches when it struck a wire causing the wire to strike his hand.  Patient sustained 3 lacerations to the right hand.  These are closed as described above.  Patient tolerated procedures well.  Wound care instructions discussed with the patient.  Follow-up with primary care in 1 week for suture removal.  Patient be placed on antibiotics prophylactically given the locations of these lacerations.  Return precautions discussed with the patient..  Patient was unable to remember his last tetanus shot but records show that he had his last tetanus 2 years ago.  Tetanus shot is not updated today.  Patient is given ED precautions to return to the ED for any worsening or new symptoms.     ____________________________________________  FINAL CLINICAL IMPRESSION(S) / ED DIAGNOSES  Final diagnoses:  Laceration of right middle finger without foreign body without damage to nail, initial encounter  Laceration of right ring finger without foreign body without damage to  nail, initial encounter  Laceration of right little finger without foreign body without damage to nail, initial encounter      NEW MEDICATIONS STARTED DURING THIS VISIT:  ED Discharge Orders         Ordered    sulfamethoxazole-trimethoprim (BACTRIM DS) 800-160 MG tablet  2 times daily        03/31/20 1747              This chart was dictated using voice recognition software/Dragon. Despite best efforts to proofread, errors can occur which can change the meaning. Any change was purely unintentional.    Darletta Moll, PA-C 03/31/20 1747    Lucrezia Starch, MD 04/01/20 [phone]

## 2020-04-07 ENCOUNTER — Inpatient Hospital Stay: Payer: Medicare Other | Admitting: Physician Assistant

## 2020-04-12 ENCOUNTER — Encounter: Payer: Self-pay | Admitting: Physician Assistant

## 2020-04-12 ENCOUNTER — Ambulatory Visit (INDEPENDENT_AMBULATORY_CARE_PROVIDER_SITE_OTHER): Payer: Medicare Other | Admitting: Physician Assistant

## 2020-04-12 ENCOUNTER — Other Ambulatory Visit: Payer: Self-pay

## 2020-04-12 VITALS — BP 140/70 | HR 75 | Temp 98.3°F | Wt 182.1 lb

## 2020-04-12 DIAGNOSIS — Z4802 Encounter for removal of sutures: Secondary | ICD-10-CM | POA: Diagnosis not present

## 2020-04-12 NOTE — Progress Notes (Signed)
Established patient visit   Patient: Billy Hardin   DOB: [birth year]   69 y.o. Male  MRN: [id number] Visit Date: 04/12/2020  Today's healthcare provider: Trinna Post, PA-C   Chief Complaint  Patient presents with  . Suture / Staple Removal  I,Adriana M Pollak,acting as a scribe for Trinna Post, PA-C.,have documented all relevant documentation on the behalf of Trinna Post, PA-C,as directed by  Trinna Post, PA-C while in the presence of Trinna Post, PA-C.  Subjective    Suture / Staple Removal The sutures were placed 11 to 14 days ago. He tried nothing since the wound repair. The treatment provided no relief. His temperature was unmeasured prior to arrival. There has been no drainage from the wound. There is no redness present. There is no swelling present. There is no pain present. He has no difficulty moving the affected extremity or digit.        Medications: Outpatient Medications Prior to Visit  Medication Sig  . acetaminophen (TYLENOL) 500 MG tablet Take 2 tablets (1,000 mg total) by mouth every 8 (eight) hours.  [name] atorvastatin (LIPITOR) 20 MG tablet Take 1 tablet by mouth once daily  . blood glucose meter kit and supplies KIT Dispense based on patient and insurance preference. Check sugar daily  . cholecalciferol (VITAMIN D) 400 units TABS tablet Take 400 Units by mouth.  [name] CINNAMON PO Take 2 capsules by mouth daily.  . dapagliflozin propanediol (FARXIGA) 5 MG TABS tablet Take 1 tablet (5 mg total) by mouth daily before breakfast.  . ibuprofen (ADVIL) 600 MG tablet Take 1 tablet (600 mg total) by mouth every 6 (six) hours as needed.  . metFORMIN (GLUCOPHAGE) 1000 MG tablet TAKE 1 TABLET BY MOUTH TWICE DAILY WITH A MEAL  . methocarbamol (ROBAXIN) 500 MG tablet Take 1 tablet (500 mg total) by mouth 2 (two) times daily.  . metoprolol succinate (TOPROL-XL) 50 MG 24 hr tablet TAKE 1 TABLET BY MOUTH ONCE DAILY WITH  OR  IMMEDIATELY  FOLLOWING  A  MEAL   . mirtazapine (REMERON) 30 MG tablet TAKE 1 TABLET(30 MG) BY MOUTH AT BEDTIME  . Omega-3 Fatty Acids (OMEGA 3 500 PO) Take by mouth.  [name] omeprazole (PRILOSEC) 40 MG capsule One daily as needed for heartburn  . ONE TOUCH ULTRA TEST test strip CHECK BLOOD SUGAR DAILY  . ONETOUCH DELICA LANCETS 83J MISC TEST BLOOD SUGAR DAILY  . Semaglutide (RYBELSUS) 3 MG TABS Take one pill daily.  [name] sulfamethoxazole-trimethoprim (BACTRIM DS) 800-160 MG tablet Take 1 tablet by mouth 2 (two) times daily.  . traZODone (DESYREL) 50 MG tablet Take 0.5-1 tablets (25-50 mg total) by mouth at bedtime as needed for sleep.  . valACYclovir (VALTREX) 1000 MG tablet TAKE 1 TABLET(1000 MG) BY MOUTH DAILY  . venlafaxine XR (EFFEXOR-XR) 37.5 MG 24 hr capsule TAKE 1 CAPSULE BY MOUTH ONCE DAILY WITH BREAKFAST  . vitamin B-12 (CYANOCOBALAMIN) 1000 MCG tablet Take 1,000 mcg by mouth daily.  . vitamin E 400 UNIT capsule Take 400 Units by mouth 2 (two) times daily.   No facility-administered medications prior to visit.    Review of Systems  Constitutional: Negative.   Respiratory: Negative.   Hematological: Negative.       Objective    BP 140/70 (BP Location: Left Arm, Patient Position: Sitting, Cuff Size: Large)   Pulse 75   Temp 98.3 F (36.8 C) (Oral)   Wt 182 lb 1.6 oz (82.6 kg)  SpO2 100%   BMI 24.70 kg/m    Physical Exam Constitutional:      Appearance: Normal appearance. He is normal weight.  Musculoskeletal:       Hands:     Comments: Three well healing lacerations, one each on the right middle, ring, and pinky fingers. Running sutures removed without issue and wound remains approximated.   Skin:    General: Skin is warm and dry.  Neurological:     General: No focal deficit present.     Mental Status: He is alert and oriented to person, place, and time.  Psychiatric:        Mood and Affect: Mood normal.        Behavior: Behavior normal.       No results found for any visits on 04/12/20.   Assessment & Plan    1. Visit for suture removal  Running sutures removed from three right fingers without issue. Wounds remained well approximated without any bleeding, purulence, or wound dehiscence. Advised on scar gel if he wishes.    No follow-ups on file.      ITrinna Post, PA-C, have reviewed all documentation for this visit. The documentation on 04/12/20 for the exam, diagnosis, procedures, and orders are all accurate and complete.  The entirety of the information documented in the History of Present Illness, Review of Systems and Physical Exam were personally obtained by me. Portions of this information were initially documented by [facility] and reviewed by me for thoroughness and accuracy.   I spent 20 minutes dedicated to the care of this patient on the date of this encounter to include pre-visit review of records, face-to-face time with the patient discussing suture removal, and post visit ordering of testing.    Paulene Floor  [facility] [phone] (phone) [phone] (fax)  [place]

## 2020-04-12 NOTE — Patient Instructions (Signed)
Suture Removal, Care After This sheet gives you information about how to care for yourself after your procedure. Your health care provider may also give you more specific instructions. If you have problems or questions, contact your health care provider. What can I expect after the procedure? After your stitches (sutures) are removed, it is common to have:  Some discomfort and swelling in the area.  Slight redness in the area. Follow these instructions at home: If you have a bandage:  Wash your hands with soap and water before you change your bandage (dressing). If soap and water are not available, use hand sanitizer.  Change your dressing as told by your health care provider. If your dressing becomes wet or dirty, or develops a bad smell, change it as soon as possible.  If your dressing sticks to your skin, soak it in warm water to loosen it. Wound care   Check your wound every day for signs of infection. Check for: ? More redness, swelling, or pain. ? Fluid or blood. ? Warmth. ? Pus or a bad smell.  Wash your hands with soap and water before and after touching your wound.  Apply cream or ointment only as directed by your health care provider. If you are using cream or ointment, wash the area with soap and water 2 times a day to remove all the cream or ointment. Rinse off the soap and pat the area dry with a clean towel.  If you have skin glue or adhesive strips on your wound, leave these closures in place. They may need to stay in place for 2 weeks or longer. If adhesive strip edges start to loosen and curl up, you may trim the loose edges. Do not remove adhesive strips completely unless your health care provider tells you to do that.  Keep the wound area dry and clean. Do not take baths, swim, or use a hot tub until your health care provider approves.  Continue to protect the wound from injury.  Do not pick at your wound. Picking can cause an infection.  When your wound has  completely healed, wear sunscreen over it or cover it with clothing when you are outside. New scars get sunburned easily, which can make scarring worse. General instructions  Take over-the-counter and prescription medicines only as told by your health care provider.  Keep all follow-up visits as told by your health care provider. This is important. Contact a health care provider if:  You have redness, swelling, or pain around your wound.  You have fluid or blood coming from your wound.  Your wound feels warm to the touch.  You have pus or a bad smell coming from your wound.  Your wound opens up. Get help right away if:  You have a fever.  You have redness that is spreading from your wound. Summary  After your sutures are removed, it is common to have some discomfort and swelling in the area.  Wash your hands with soap and water before you change your bandage (dressing).  Keep the wound area dry and clean. Do not take baths, swim, or use a hot tub until your health care provider approves. This information is not intended to replace advice given to you by your health care provider. Make sure you discuss any questions you have with your health care provider. Document Revised: 04/13/2017 Document Reviewed: 06/06/2016 Elsevier Patient Education  2020 [employer] Inc[employer]  

## 2020-04-26 ENCOUNTER — Other Ambulatory Visit: Payer: Self-pay | Admitting: Physician Assistant

## 2020-04-26 NOTE — Telephone Encounter (Signed)
Requested Prescriptions  Pending Prescriptions Disp Refills  . atorvastatin (LIPITOR) 20 MG tablet [Pharmacy Med Name: Atorvastatin Calcium 20 MG Oral Tablet] 90 tablet 0    Sig: Take 1 tablet by mouth once daily     Cardiovascular:  Antilipid - Statins Failed - 04/26/2020  8:55 AM      Failed - LDL in normal range and within 360 days    Ldl Cholesterol, Calc  Date Value Ref Range Status  11/04/2012 65 0 - 100 mg/dL Final   LDL Chol Calc (NIH)  Date Value Ref Range Status  07/29/2019 50 0 - 99 mg/dL Final         Failed - Triglycerides in normal range and within 360 days    Triglycerides  Date Value Ref Range Status  07/29/2019 156 (H) 0 - 149 mg/dL Final  11/04/2012 296 (H) 0 - 200 mg/dL Final         Passed - Total Cholesterol in normal range and within 360 days    Cholesterol, Total  Date Value Ref Range Status  07/29/2019 128 100 - 199 mg/dL Final   Cholesterol  Date Value Ref Range Status  11/04/2012 165 0 - 200 mg/dL Final         Passed - HDL in normal range and within 360 days    HDL Cholesterol  Date Value Ref Range Status  11/04/2012 41 40 - 60 mg/dL Final   HDL  Date Value Ref Range Status  07/29/2019 52 >39 mg/dL Final         Passed - Patient is not pregnant      Passed - Valid encounter within last 12 months    Recent Outpatient Visits          2 weeks ago Visit for suture removal   [place], Adriana M, PA-C   2 months ago Type 2 diabetes mellitus without complication, without long-term current use of insulin [facility])   [place], [place], PA-C   5 months ago Welcome to [employer] preventive visit   [employer], Adriana M, [place]   9 months ago Hypertension associated with diabetes [facility])   [place], [place], PA-C   1 year ago Type 2 diabetes mellitus without complication, without long-term current use of insulin [facility])   [place],  [place], [place]

## 2020-05-13 ENCOUNTER — Ambulatory Visit (INDEPENDENT_AMBULATORY_CARE_PROVIDER_SITE_OTHER): Payer: Medicare Other

## 2020-05-13 ENCOUNTER — Other Ambulatory Visit: Payer: Self-pay

## 2020-05-13 ENCOUNTER — Ambulatory Visit
Admission: EM | Admit: 2020-05-13 | Discharge: 2020-05-13 | Disposition: A | Discharge status: Home / Self Care | Payer: Medicare Other | Attending: Internal Medicine | Admitting: Internal Medicine

## 2020-05-13 DIAGNOSIS — R059 Cough, unspecified: Secondary | ICD-10-CM

## 2020-05-13 DIAGNOSIS — R002 Palpitations: Secondary | ICD-10-CM | POA: Diagnosis not present

## 2020-05-13 DIAGNOSIS — R0789 Other chest pain: Secondary | ICD-10-CM | POA: Diagnosis not present

## 2020-05-13 DIAGNOSIS — K209 Esophagitis, unspecified without bleeding: Secondary | ICD-10-CM | POA: Insufficient documentation

## 2020-05-13 DIAGNOSIS — I10 Essential (primary) hypertension: Secondary | ICD-10-CM | POA: Diagnosis present

## 2020-05-13 LAB — MAGNESIUM: Magnesium: 1.9 mg/dL (ref 1.7–2.4)

## 2020-05-13 LAB — COMPREHENSIVE METABOLIC PANEL
ALT: 19 U/L (ref 0–44)
AST: 19 U/L (ref 15–41)
Albumin: 4.4 g/dL (ref 3.5–5.0)
Alkaline Phosphatase: 49 U/L (ref 38–126)
Anion gap: 10 (ref 5–15)
BUN: 17 mg/dL (ref 8–23)
CO2: 25 mmol/L (ref 22–32)
Calcium: 9.3 mg/dL (ref 8.9–10.3)
Chloride: 104 mmol/L (ref 98–111)
Creatinine, Ser: 1.05 mg/dL (ref 0.61–1.24)
GFR, Estimated: >60 mL/min (ref >60)
Glucose, Bld: 154 mg/dL — ABNORMAL HIGH (ref 70–99)
Potassium: 3.8 mmol/L (ref 3.5–5.1)
Sodium: 139 mmol/L (ref 135–145)
Total Bilirubin: 0.4 mg/dL (ref 0.3–1.2)
Total Protein: 7.1 g/dL (ref 6.5–8.1)

## 2020-05-13 LAB — CBC WITH DIFFERENTIAL/PLATELET
Abs Immature Granulocytes: 0.01 10*3/uL (ref 0.00–0.07)
Basophils Absolute: 0 10*3/uL (ref 0.0–0.1)
Basophils Relative: 1 %
Eosinophils Absolute: 0.2 10*3/uL (ref 0.0–0.5)
Eosinophils Relative: 4 %
HCT: 41.2 % (ref 39.0–52.0)
Hemoglobin: 13.8 g/dL (ref 13.0–17.0)
Immature Granulocytes: 0 %
Lymphocytes Relative: 26 %
Lymphs Abs: 1.3 10*3/uL (ref 0.7–4.0)
MCH: 32.2 pg (ref 26.0–34.0)
MCHC: 33.5 g/dL (ref 30.0–36.0)
MCV: 96 fL (ref 80.0–100.0)
Monocytes Absolute: 0.5 10*3/uL (ref 0.1–1.0)
Monocytes Relative: 10 %
Neutro Abs: 3.1 10*3/uL (ref 1.7–7.7)
Neutrophils Relative %: 59 %
Platelets: 208 10*3/uL (ref 150–400)
RBC: 4.29 MIL/uL (ref 4.22–5.81)
RDW: 13.6 % (ref 11.5–15.5)
WBC: 5.1 10*3/uL (ref 4.0–10.5)
nRBC: 0 % (ref 0.0–0.2)

## 2020-05-13 LAB — FIBRIN DERIVATIVES D-DIMER (ARMC ONLY): Fibrin derivatives D-dimer (ARMC): 279.87 ng/mL (FEU) (ref 0.00–499.00)

## 2020-05-13 MED ORDER — ALUM & MAG HYDROXIDE-SIMETH 200-200-20 MG/5ML PO SUSP
30.0000 mL | Freq: Once | ORAL | Status: AC
  Administered 2020-05-13: 13:00:00 30 mL via ORAL

## 2020-05-13 MED ORDER — LIDOCAINE VISCOUS HCL 2 % MT SOLN
15.0000 mL | Freq: Once | OROMUCOSAL | Status: AC
  Administered 2020-05-13: 13:00:00 15 mL via ORAL

## 2020-05-13 NOTE — ED Provider Notes (Signed)
[facility]    CSN: [id number] Arrival date & time: 05/13/20  1018      History   Chief Complaint Chief Complaint  Patient presents with   Cough   Shortness of Breath    HPI Billy Hardin is a 69 y.o. male. who presents with onset of cough and chest tightness x 2 days.  He cycled yesterday am and did not make it worse, but is also hurts to walk or move arms and when eats. His normal heart rate is in the low 70's, and has noticed increased in the low 80's He also has noticed increased BP's in the 294'T systolic, but does not check his BP to know what his normal is.  Chest tightness is worse after eating a meal and laying down flat on his backand when his pulse increases. Denies N/V or diaphoresis.  Had covid infection 2 months.  Denies nausea or sweating with chest tighness and palpitations Has not been taking his prilosic in the past year, and only took it once last month for GERD. He admits food gets stuck in his esophagus and sometimes has to vomit to relieve the pain.  Past Medical History:  Diagnosis Date   Chronic migraine without aura without status migrainosus, not intractable 6/54/6503   Complication of anesthesia    had catheter 4 days unable to void   Deaf    Rt ear   Diabetes mellitus without complication ([place])    Elevated lipids    Herpes    Insomnia    Seasonal allergies    TMJ (dislocation of temporomandibular joint)    Uses hearing aid    Left    Patient Active Problem List   Diagnosis Date Noted   Hypertension associated with diabetes ([place]) 08/12/2018   Hyperlipidemia associated with type 2 diabetes mellitus ([place]) 08/12/2018   History of migraine headaches 04/29/2018   Chronic fatigue, unspecified 02/08/2017   Depression 05/30/2016   Male hypogonadism 05/25/2015   Genital herpes 05/25/2015   Situational stress 05/25/2015   History of kidney stones 05/25/2015   Diabetes mellitus type 2, uncomplicated ([place])  54/65/6812   ASNHL (asymmetrical sensorineural hearing loss) 09/15/2014   H/O adenomatous polyp of colon 08/07/2013   Insomnia 01/21/2008   Family history of cardiovascular disease 02/04/2007    Past Surgical History:  Procedure Laterality Date   KIDNEY STONE SURGERY     MIDDLE EAR SURGERY     PENILE PROSTHESIS IMPLANT N/A 11/03/2014   Procedure: PENILE PROTHESIS INFLATABLE;  Surgeon: Royston Cowper, MD;  Location: [facility] ORS;  Service: Urology;  Laterality: N/A;   TONSILLECTOMY AND ADENOIDECTOMY       Home Medications    Prior to Admission medications   Medication Sig Start Date End Date Taking? Authorizing Provider  atorvastatin (LIPITOR) 20 MG tablet Take 1 tablet by mouth once daily 04/26/20   Carles Collet M, PA-C  blood glucose meter kit and supplies KIT Dispense based on patient and insurance preference. Check sugar daily 07/01/15   Carmon Ginsberg, PA  cholecalciferol (VITAMIN D) 400 units TABS tablet Take 400 Units by mouth.    [provider]  CINNAMON PO Take 2 capsules by mouth daily.    [provider]  dapagliflozin propanediol (FARXIGA) 5 MG TABS tablet Take 1 tablet (5 mg total) by mouth daily before breakfast. 02/02/20   Trinna Post, PA-C  ibuprofen (ADVIL) 600 MG tablet Take 1 tablet (600 mg total) by mouth every 6 (six) hours  as needed. 03/16/20   Margarette Canada, NP  metFORMIN (GLUCOPHAGE) 1000 MG tablet TAKE 1 TABLET BY MOUTH TWICE DAILY WITH A MEAL 03/02/20   Carles Collet M, PA-C  methocarbamol (ROBAXIN) 500 MG tablet Take 1 tablet (500 mg total) by mouth 2 (two) times daily. 03/16/20   Margarette Canada, NP  metoprolol succinate (TOPROL-XL) 50 MG 24 hr tablet TAKE 1 TABLET BY MOUTH ONCE DAILY WITH  OR  IMMEDIATELY  FOLLOWING  A  MEAL 02/23/20   Carles Collet M, PA-C  mirtazapine (REMERON) 30 MG tablet TAKE 1 TABLET(30 MG) BY MOUTH AT BEDTIME 08/27/19   Trinna Post, PA-C  Omega-3 Fatty Acids (OMEGA 3 500 PO) Take by mouth.     [provider]  omeprazole (PRILOSEC) 40 MG capsule One daily as needed for heartburn 11/01/17   Carmon Ginsberg, PA  ONE TOUCH ULTRA TEST test strip CHECK BLOOD SUGAR DAILY 06/10/16   Carmon Ginsberg, PA  [facility] DELICA LANCETS 77A MISC TEST BLOOD SUGAR DAILY 07/15/16   Carmon Ginsberg, PA  Semaglutide (RYBELSUS) 3 MG TABS Take one pill daily. 01/29/20   Trinna Post, PA-C  sulfamethoxazole-trimethoprim (BACTRIM DS) 800-160 MG tablet Take 1 tablet by mouth 2 (two) times daily. 03/31/20   Cuthriell, Charline Bills, PA-C  traZODone (DESYREL) 50 MG tablet Take 0.5-1 tablets (25-50 mg total) by mouth at bedtime as needed for sleep. 01/29/20   Trinna Post, PA-C  valACYclovir (VALTREX) 1000 MG tablet TAKE 1 TABLET(1000 MG) BY MOUTH DAILY 11/06/19   Trinna Post, PA-C  venlafaxine XR (EFFEXOR-XR) 37.5 MG 24 hr capsule TAKE 1 CAPSULE BY MOUTH ONCE DAILY WITH BREAKFAST 02/23/20   Carles Collet M, PA-C  vitamin B-12 (CYANOCOBALAMIN) 1000 MCG tablet Take 1,000 mcg by mouth daily.    [provider]  vitamin E 400 UNIT capsule Take 400 Units by mouth 2 (two) times daily.    [provider]    Family History Family History  Problem Relation Age of Onset   Cancer Mother    CAD Brother     Social History Social History   Tobacco Use   Smoking status: Never Smoker   Smokeless tobacco: Never Used  Scientific laboratory technician Use: Never used  Substance Use Topics   Alcohol use: Yes    Alcohol/week: 0.0 standard drinks    Comment: 1-2 drinks total of 2-3x a year   Drug use: No     Allergies   Bupropion, Sertraline, and Penicillins   Review of Systems Review of Systems See HPI  Physical Exam Triage Vital Signs ED Triage Vitals  Enc Vitals Group     BP 05/13/20 1039 (S) (!) 162/83     Pulse Rate 05/13/20 1039 82     Resp 05/13/20 1039 16     Temp --      Temp src --      SpO2 05/13/20 1039 99 %     Weight 05/13/20 1037 175 lb (79.4 kg)     Height  --      Head Circumference --      Peak Flow --      Pain Score 05/13/20 1034 1     Pain Loc --      Pain Edu? --      Excl. in [place]? --    No data found.  Updated Vital Signs BP (S) (!) 162/83 (BP Location: Right Arm)    Pulse 82    Resp 16  Wt 175 lb (79.4 kg)    SpO2 99%    BMI 23.73 kg/m   Visual Acuity Right Eye Distance:   Left Eye Distance:   Bilateral Distance:    Right Eye Near:   Left Eye Near:    Bilateral Near:     Physical Exam Physical Exam Vitals signs and nursing note reviewed.  Constitutional:      General: She is not in acute distress.    Appearance: Normal appearance. She is not ill-appearing, toxic-appearing or diaphoretic.  HENT:     Head: Normocephalic.     Right Ear: Tympanic membrane, ear canal and external ear normal.     Left Ear: Tympanic membrane, ear canal and external ear normal.     Nose: Nose normal.     Mouth/Throat:     Mouth: Mucous membranes are moist.  Eyes:     General: No scleral icterus.       Right eye: No discharge.        Left eye: No discharge.     Conjunctiva/sclera: Conjunctivae normal.  Neck:     Musculoskeletal: Neck supple. No neck rigidity.  Cardiovascular:     Rate and Rhythm: Normal rate and regular rhythm.     Heart sounds: No murmur.  Pulmonary:     Effort: Pulmonary effort is normal. No chest wall tendernss    Breath sounds: Normal breath sounds.   Musculoskeletal: Normal range of motion.  Lymphadenopathy:     Cervical: No cervical adenopathy.  Skin:    General: Skin is warm and dry.     Coloration: Skin is not jaundiced.     Findings: No rash.  Neurological:     Mental Status: She is alert and oriented to person, place, and time.     Gait: Gait normal.  Psychiatric:        Mood and Affect: Mood normal.        Behavior: Behavior normal.        Thought Content: Thought content normal.        Judgment: Judgment normal.     UC Treatments / Results  Labs (all labs ordered are listed, but only  abnormal results are displayed) Labs Reviewed  COMPREHENSIVE METABOLIC PANEL - Abnormal; Notable for the following components:      Result Value   Glucose, Bld 154 (*)    All other components within normal limits  MAGNESIUM  FIBRIN DERIVATIVES D-DIMER ([facility] ONLY)  CBC WITH DIFFERENTIAL/PLATELET  All his labs were nl except for slight elevation of glucose   EKG NSR with sinus arrhythmia   Radiology DG Chest 2 View  Result Date: 05/13/2020 CLINICAL DATA:  Chest tightness, cough EXAM: CHEST - 2 VIEW COMPARISON:  07/25/2018 FINDINGS: Heart and mediastinal contours are within normal limits. No focal opacities or effusions. No acute bony abnormality. IMPRESSION: No active cardiopulmonary disease. Electronically Signed   By: Rolm Baptise M.D.   On: 05/13/2020 11:38    Procedures Procedures (including critical care time)  Medications Ordered in UC Medications - No data to display  Initial Impression / Assessment and Plan / UC Course  I have reviewed the triage vital signs and the nursing notes. He was given a GI cocktail and helped his chest discomfort a little.  I believe his chest discomfort is from esophagitis and his pulse increase may be from the discomfort. I placed him on Prilosec as noted. See instruction.  Pertinent labs & imaging results that were available during my care  of the patient were reviewed by me and considered in my medical decision making (see chart for details).  Final Clinical Impressions(s) / UC Diagnoses   Final diagnoses:  Cough  Palpitations  Chest pressure     Discharge Instructions     All your labs are normal except your glucose is a little elevated at 154, and chest xray is also normal  Though your magnesium and potasium are in the low normal, I would advise you to try taking extra magnesium like 200 mg a day and increase foods rich in potasium and see if your palpitations improve. If not you will need to follow up with a cardiologist and have a  holter monitor placed. If you get worse, please go to the ER since they can hook you up to a heart monitor.     ED Prescriptions    None     PDMP not reviewed this encounter.   Shelby Mattocks, Hershal Coria 05/13/20 2218

## 2020-05-13 NOTE — ED Triage Notes (Signed)
Patient presents to Urgent Care with complaints of chest tightness  cough x 2 days ago. Patient reports chest discomfort increased with activity. Pt states he recently started cycling and unsure if this is the cause of his chest discomfort. Pt reports checking his Bp these past couple days and running in the 170's.    Denies fever.

## 2020-05-13 NOTE — Discharge Instructions (Addendum)
All your labs are normal except your glucose is a little elevated at 154, and chest xray is also normal  Though your magnesium and potasium are in the low normal, I would advise you to try taking extra magnesium like 200 mg a day and increase foods rich in potasium  and magnesium and see if your palpitations improve. If not you will need to follow up with a cardiologist and have a holter monitor placed. If you get worse, please go to the ER since they can hook you up to a heart monitor.   I believe the chest pain could be from your esophagus being inflammed and could be having spasms. Get back on the Prilosec 40 mg every morning on empty stomach for 6-8 week and make sure to follow up with your gastroenterologist   Monitor your blood pressure mid morning after taking your medication in the am, and should be less than 140/90, if elevated call your family Dr and your medication dose may need to be increased

## 2020-05-23 ENCOUNTER — Other Ambulatory Visit: Payer: Self-pay | Admitting: Physician Assistant

## 2020-05-23 DIAGNOSIS — G43709 Chronic migraine without aura, not intractable, without status migrainosus: Secondary | ICD-10-CM

## 2020-05-25 ENCOUNTER — Other Ambulatory Visit: Payer: Self-pay | Admitting: Physician Assistant

## 2020-05-25 DIAGNOSIS — G43709 Chronic migraine without aura, not intractable, without status migrainosus: Secondary | ICD-10-CM

## 2020-05-28 ENCOUNTER — Emergency Department
Admission: EM | Admit: 2020-05-28 | Discharge: 2020-05-28 | Disposition: A | Discharge status: Home / Self Care | Payer: Medicare HMO | Attending: Emergency Medicine | Admitting: Emergency Medicine

## 2020-05-28 ENCOUNTER — Emergency Department: Payer: Medicare HMO

## 2020-05-28 ENCOUNTER — Other Ambulatory Visit: Payer: Self-pay

## 2020-05-28 DIAGNOSIS — S61412A Laceration without foreign body of left hand, initial encounter: Secondary | ICD-10-CM | POA: Insufficient documentation

## 2020-05-28 DIAGNOSIS — S6992XA Unspecified injury of left wrist, hand and finger(s), initial encounter: Secondary | ICD-10-CM | POA: Diagnosis present

## 2020-05-28 DIAGNOSIS — E785 Hyperlipidemia, unspecified: Secondary | ICD-10-CM | POA: Diagnosis not present

## 2020-05-28 DIAGNOSIS — W312XXA Contact with powered woodworking and forming machines, initial encounter: Secondary | ICD-10-CM | POA: Insufficient documentation

## 2020-05-28 DIAGNOSIS — Z23 Encounter for immunization: Secondary | ICD-10-CM | POA: Diagnosis not present

## 2020-05-28 DIAGNOSIS — Z79899 Other long term (current) drug therapy: Secondary | ICD-10-CM | POA: Insufficient documentation

## 2020-05-28 DIAGNOSIS — Z86010 Personal history of colonic polyps: Secondary | ICD-10-CM | POA: Diagnosis not present

## 2020-05-28 DIAGNOSIS — E1169 Type 2 diabetes mellitus with other specified complication: Secondary | ICD-10-CM | POA: Diagnosis not present

## 2020-05-28 DIAGNOSIS — Z7984 Long term (current) use of oral hypoglycemic drugs: Secondary | ICD-10-CM | POA: Insufficient documentation

## 2020-05-28 MED ORDER — LIDOCAINE HCL (PF) 1 % IJ SOLN
5.0000 mL | Freq: Once | INTRAMUSCULAR | Status: AC
  Administered 2020-05-28: 5 mL
  Filled 2020-05-28: qty 5

## 2020-05-28 MED ORDER — CEPHALEXIN 500 MG PO CAPS: 500.0000 mg | ORAL_CAPSULE | Freq: Three times a day (TID) | ORAL | 0 refills | Status: DC

## 2020-05-28 MED ORDER — TETANUS-DIPHTH-ACELL PERTUSSIS 5-2.5-18.5 LF-MCG/0.5 IM SUSY
0.5000 mL | PREFILLED_SYRINGE | Freq: Once | INTRAMUSCULAR | Status: AC
  Administered 2020-05-28: 0.5 mL via INTRAMUSCULAR
  Filled 2020-05-28: qty 0.5

## 2020-05-28 NOTE — ED Triage Notes (Signed)
PT to ED for lac to L palm, cut it with wood chipper. Bleeding controlled. Unknown last tetanus shot. No anticoags.

## 2020-05-28 NOTE — Discharge Instructions (Addendum)
Follow-up with your primary care provider to see if they are able to take your sutures out in 10 days.  Clean area daily with mild soap and water and watch for any signs of infection.  Begin taking Keflex 500 mg 3 times daily for 5 days to help prevent infection.  Should you develop a rash while taking this medication discontinue it immediately.  You may also go to any urgent care for suture removal in 10 days.  You may take Tylenol if needed for pain and also elevate your hand to decrease swelling which will also help with pain.

## 2020-05-28 NOTE — ED Provider Notes (Signed)
[facility] Emergency Department Provider Note  ____________________________________________   Event Date/Time   First MD Initiated Contact with Patient 05/28/20 1015     (approximate)  I have reviewed the triage vital signs and the nursing notes.   HISTORY  Chief Complaint Laceration   HPI Billy Hardin is a 70 y.o. male presents to the ED with complaint of laceration to his left palm that happened last evening on a wood chipper.  Patient is unaware if last time he got a tetanus booster.  Patient reports that he was having difficulty controlling the bleeding last evening but did not want to come to the emergency department.  He denies being on any anticoagulants.  He rates his pain as 1 out of 10.       Past Medical History:  Diagnosis Date  . Chronic migraine without aura without status migrainosus, not intractable 05/30/2016  . Complication of anesthesia    had catheter 4 days unable to void  . Deaf    Rt ear  . Diabetes mellitus without complication ([place])   . Elevated lipids   . Herpes   . Insomnia   . Seasonal allergies   . TMJ (dislocation of temporomandibular joint)   . Uses hearing aid    Left    Patient Active Problem List   Diagnosis Date Noted  . Hypertension associated with diabetes ([place]) 08/12/2018  . Hyperlipidemia associated with type 2 diabetes mellitus ([place]) 08/12/2018  . History of migraine headaches 04/29/2018  . Chronic fatigue, unspecified 02/08/2017  . Depression 05/30/2016  . Male hypogonadism 05/25/2015  . Genital herpes 05/25/2015  . Situational stress 05/25/2015  . History of kidney stones 05/25/2015  . Diabetes mellitus type 2, uncomplicated ([place]) 05/17/7251  . ASNHL (asymmetrical sensorineural hearing loss) 09/15/2014  . H/O adenomatous polyp of colon 08/07/2013  . Insomnia 01/21/2008  . Family history of cardiovascular disease 02/04/2007    Past Surgical History:  Procedure Laterality Date  . KIDNEY  STONE SURGERY    . MIDDLE EAR SURGERY    . PENILE PROSTHESIS IMPLANT N/A 11/03/2014   Procedure: PENILE PROTHESIS INFLATABLE;  Surgeon: Royston Cowper, MD;  Location: [facility] ORS;  Service: Urology;  Laterality: N/A;  . TONSILLECTOMY AND ADENOIDECTOMY      Prior to Admission medications   Medication Sig Start Date End Date Taking? Authorizing Provider  cephALEXin (KEFLEX) 500 MG capsule Take 1 capsule (500 mg total) by mouth 3 (three) times daily. 05/28/20  Yes Johnn Hai, PA-C  atorvastatin (LIPITOR) 20 MG tablet Take 1 tablet by mouth once daily 04/26/20   Carles Collet M, PA-C  blood glucose meter kit and supplies KIT Dispense based on patient and insurance preference. Check sugar daily 07/01/15   Carmon Ginsberg, PA  cholecalciferol (VITAMIN D) 400 units TABS tablet Take 400 Units by mouth.    [provider]  CINNAMON PO Take 2 capsules by mouth daily.    [provider]  dapagliflozin propanediol (FARXIGA) 5 MG TABS tablet Take 1 tablet (5 mg total) by mouth daily before breakfast. 02/02/20   Trinna Post, PA-C  ibuprofen (ADVIL) 600 MG tablet Take 1 tablet (600 mg total) by mouth every 6 (six) hours as needed. 03/16/20   Margarette Canada, NP  metFORMIN (GLUCOPHAGE) 1000 MG tablet TAKE 1 TABLET BY MOUTH TWICE DAILY WITH A MEAL 03/02/20   Carles Collet M, PA-C  methocarbamol (ROBAXIN) 500 MG tablet Take 1 tablet (500 mg total) by mouth  2 (two) times daily. 03/16/20   Margarette Canada, NP  metoprolol succinate (TOPROL-XL) 50 MG 24 hr tablet TAKE 1 TABLET BY MOUTH ONCE DAILY WITH  OR  IMMEDIATELY  FOLLOWING  A  MEAL 05/24/20   Carles Collet M, PA-C  mirtazapine (REMERON) 30 MG tablet TAKE 1 TABLET(30 MG) BY MOUTH AT BEDTIME 08/27/19   Trinna Post, PA-C  Omega-3 Fatty Acids (OMEGA 3 500 PO) Take by mouth.    [provider]  omeprazole (PRILOSEC) 40 MG capsule One daily as needed for heartburn 11/01/17   Carmon Ginsberg, PA  ONE TOUCH ULTRA TEST test strip  CHECK BLOOD SUGAR DAILY 06/10/16   Carmon Ginsberg, PA  [facility] DELICA LANCETS 00T MISC TEST BLOOD SUGAR DAILY 07/15/16   Carmon Ginsberg, PA  Semaglutide (RYBELSUS) 3 MG TABS Take one pill daily. 01/29/20   Trinna Post, PA-C  sulfamethoxazole-trimethoprim (BACTRIM DS) 800-160 MG tablet Take 1 tablet by mouth 2 (two) times daily. 03/31/20   Cuthriell, Charline Bills, PA-C  traZODone (DESYREL) 50 MG tablet Take 0.5-1 tablets (25-50 mg total) by mouth at bedtime as needed for sleep. 01/29/20   Trinna Post, PA-C  valACYclovir (VALTREX) 1000 MG tablet TAKE 1 TABLET(1000 MG) BY MOUTH DAILY 11/06/19   Trinna Post, PA-C  venlafaxine XR (EFFEXOR-XR) 37.5 MG 24 hr capsule TAKE 1 CAPSULE BY MOUTH ONCE DAILY WITH BREAKFAST 02/23/20   Carles Collet M, PA-C  vitamin B-12 (CYANOCOBALAMIN) 1000 MCG tablet Take 1,000 mcg by mouth daily.    [provider]  vitamin E 400 UNIT capsule Take 400 Units by mouth 2 (two) times daily.    [provider]    Allergies Bupropion, Sertraline, and Penicillins  Family History  Problem Relation Age of Onset  . Cancer Mother   . CAD Brother     Social History Social History   Tobacco Use  . Smoking status: Never Smoker  . Smokeless tobacco: Never Used  Vaping Use  . Vaping Use: Never used  Substance Use Topics  . Alcohol use: Yes    Alcohol/week: 0.0 standard drinks    Comment: 1-2 drinks total of 2-3x a year  . Drug use: No    Review of Systems Constitutional: No fever/chills Eyes: No visual changes. Cardiovascular: Denies chest pain. Respiratory: Denies shortness of breath. Genitourinary: Negative for dysuria. Musculoskeletal: Left hand pain. Skin: Laceration left hand. Neurological: Negative for headaches, focal weakness or numbness.  ____________________________________________   PHYSICAL EXAM:  VITAL SIGNS: ED Triage Vitals  Enc Vitals Group     BP 05/28/20 0931 (!) 159/70     Pulse Rate 05/28/20 0931 92      Resp 05/28/20 0931 18     Temp 05/28/20 0931 98.3 F (36.8 C)     Temp Source 05/28/20 0931 Oral     SpO2 05/28/20 0931 98 %     Weight 05/28/20 0931 175 lb (79.4 kg)     Height 05/28/20 0931 6' (1.829 m)     Head Circumference --      Peak Flow --      Pain Score 05/28/20 0932 1     Pain Loc --      Pain Edu? --      Excl. in [place]? --     Constitutional: Alert and oriented. Well appearing and in no acute distress. Eyes: Conjunctivae are normal.  Head: Atraumatic. Neck: No stridor.   Cardiovascular: Normal rate, regular rhythm. Grossly normal heart sounds.  Good peripheral  circulation. Respiratory: Normal respiratory effort.  No retractions. Lungs CTAB. Musculoskeletal: Examination of the left hand there is a superficial laceration noted in the webspace between the thumb and index finger.  There continues to be active bleeding.  No foreign body was seen.  Patient is able to flex and extend both index and thumb.  No point tenderness noted throughout the remainder of his left hand.  Motor or sensory function intact.  Capillary refills less than 3 seconds. Neurologic:  Normal speech and language. No gross focal neurologic deficits are appreciated.  Skin:  Skin is warm, dry.  Laceration as noted above. Psychiatric: Mood and affect are normal. Speech and behavior are normal.  ____________________________________________   LABS (all labs ordered are listed, but only abnormal results are displayed)  Labs Reviewed - No data to display ____________________________________________   RADIOLOGY I, Johnn Hai, personally viewed and evaluated these images (plain radiographs) as part of my medical decision making, as well as reviewing the written report by the radiologist.  Official radiology report(s): DG Hand Complete Left  Result Date: 05/28/2020 CLINICAL DATA:  Laceration with wood chipper EXAM: LEFT HAND - COMPLETE 3+ VIEW COMPARISON:  July 25, 2018 FINDINGS: Alignment is  anatomic. No acute fracture. No radiopaque body. Mild degenerative changes are present at the first carpometacarpal joint and some metacarpophalangeal and interphalangeal joint. IMPRESSION: No acute fracture.  No radiopaque foreign body. Electronically Signed   By: Macy Mis M.D.   On: 05/28/2020 10:48    ____________________________________________   PROCEDURES  Procedure(s) performed (including Critical Care):  Marland KitchenMarland KitchenLaceration Repair  Date/Time: 05/28/2020 11:00 AM Performed by: Johnn Hai, PA-C Authorized by: Johnn Hai, PA-C   Consent:    Consent obtained:  Verbal   Consent given by:  Patient   Risks discussed:  Infection, pain and poor wound healing Universal protocol:    Patient identity confirmed:  Verbally with patient Anesthesia:    Anesthesia method:  Local infiltration   Local anesthetic:  Lidocaine 1% w/o epi Laceration details:    Location:  Hand   Hand location:  L palm   Length (cm):  3.5 Pre-procedure details:    Preparation:  Patient was prepped and draped in usual sterile fashion and imaging obtained to evaluate for foreign bodies Exploration:    Limited defect created (wound extended): no     Hemostasis achieved with:  Direct pressure   Imaging obtained: x-ray     Imaging outcome: foreign body not noted     Wound exploration: wound explored through full range of motion and entire depth of wound visualized     Contaminated: no   Treatment:    Area cleansed with:  Saline   Amount of cleaning:  Extensive   Irrigation solution:  Sterile saline   Irrigation method:  Syringe   Visualized foreign bodies/material removed: no     Debridement:  None Skin repair:    Repair method:  Sutures   Suture size:  5-0   Suture material:  Nylon   Suture technique:  Simple interrupted   Number of sutures:  4 Approximation:    Approximation:  Loose Repair type:    Repair type:  Simple Post-procedure details:    Dressing:  Non-adherent dressing    Procedure completion:  Tolerated     ____________________________________________   INITIAL IMPRESSION / ASSESSMENT AND PLAN / ED COURSE  As part of my medical decision making, I reviewed the following data within the electronic MEDICAL RECORD NUMBER Notes from  prior ED visits and [place] Controlled Substance Database  70 year old male presents to the ED with complaint of laceration to his left hand that occurred 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  Patient was given a tetanus booster while in the ED.  X-ray was negative for acute bony injury and no foreign body noted.  Patient tolerated suturing procedure without any difficulty.  He is aware that sutures need to be removed in approximately 10 days and to check with his PCP to see if his possible to have them taken out here.  He was also advised that urgent care can take sutures out rather than coming to the emergency department.  He was given instructions on how to care for his wound and watch for any signs of infection.  A prescription for Keflex was sent to his pharmacy since the wound has been open for more than 12 hours.  ____________________________________________   FINAL CLINICAL IMPRESSION(S) / ED DIAGNOSES  Final diagnoses:  Laceration of left hand without foreign body, initial encounter     ED Discharge Orders         Ordered    cephALEXin (KEFLEX) 500 MG capsule  3 times daily        05/28/20 1126          *Please note:  [name] was evaluated in Emergency Department on 05/28/2020 for the symptoms described in the history of present illness. He was evaluated in the context of the global COVID-19 pandemic, which necessitated consideration that the patient might be at risk for infection with the SARS-CoV-2 virus that causes COVID-19. Institutional protocols and algorithms that pertain to the evaluation of patients at risk for COVID-19 are in a state of rapid change based on information released by regulatory  bodies including the CDC and federal and state organizations. These policies and algorithms were followed during the patient's care in the ED.  Some ED evaluations and interventions may be delayed as a result of limited staffing during and the pandemic.*   Note:  This document was prepared using Dragon voice recognition software and may include unintentional dictation errors.    Johnn Hai, PA-C 05/28/20 1243    Blake Divine, MD 05/29/20 [phone]

## 2020-05-28 NOTE — ED Notes (Signed)
Non adherent pad with gauze dressing applied

## 2020-07-26 ENCOUNTER — Other Ambulatory Visit: Payer: Self-pay | Admitting: Physician Assistant

## 2020-07-27 ENCOUNTER — Other Ambulatory Visit: Payer: Self-pay

## 2020-07-27 ENCOUNTER — Ambulatory Visit (INDEPENDENT_AMBULATORY_CARE_PROVIDER_SITE_OTHER): Payer: Medicare HMO | Admitting: Physician Assistant

## 2020-07-27 ENCOUNTER — Other Ambulatory Visit: Payer: Self-pay | Admitting: Physician Assistant

## 2020-07-27 VITALS — BP 130/70 | HR 82 | Temp 98.2°F | Wt 182.0 lb

## 2020-07-27 DIAGNOSIS — Z125 Encounter for screening for malignant neoplasm of prostate: Secondary | ICD-10-CM

## 2020-07-27 DIAGNOSIS — Z23 Encounter for immunization: Secondary | ICD-10-CM

## 2020-07-27 DIAGNOSIS — G4700 Insomnia, unspecified: Secondary | ICD-10-CM

## 2020-07-27 DIAGNOSIS — I152 Hypertension secondary to endocrine disorders: Secondary | ICD-10-CM

## 2020-07-27 DIAGNOSIS — R12 Heartburn: Secondary | ICD-10-CM

## 2020-07-27 DIAGNOSIS — G43709 Chronic migraine without aura, not intractable, without status migrainosus: Secondary | ICD-10-CM

## 2020-07-27 DIAGNOSIS — E1159 Type 2 diabetes mellitus with other circulatory complications: Secondary | ICD-10-CM

## 2020-07-27 DIAGNOSIS — E291 Testicular hypofunction: Secondary | ICD-10-CM

## 2020-07-27 DIAGNOSIS — E119 Type 2 diabetes mellitus without complications: Secondary | ICD-10-CM | POA: Diagnosis not present

## 2020-07-27 DIAGNOSIS — E785 Hyperlipidemia, unspecified: Secondary | ICD-10-CM

## 2020-07-27 DIAGNOSIS — Z0000 Encounter for general adult medical examination without abnormal findings: Secondary | ICD-10-CM | POA: Diagnosis not present

## 2020-07-27 DIAGNOSIS — E1169 Type 2 diabetes mellitus with other specified complication: Secondary | ICD-10-CM | POA: Diagnosis not present

## 2020-07-27 DIAGNOSIS — F3289 Other specified depressive episodes: Secondary | ICD-10-CM | POA: Diagnosis not present

## 2020-07-27 MED ORDER — METOPROLOL SUCCINATE ER 50 MG PO TB24: ORAL_TABLET | ORAL | 0 refills | Status: DC

## 2020-07-27 MED ORDER — OMEPRAZOLE 40 MG PO CPDR: DELAYED_RELEASE_CAPSULE | ORAL | 1 refills | Status: DC

## 2020-07-27 MED ORDER — METFORMIN HCL 1000 MG PO TABS: 1000.0000 mg | ORAL_TABLET | Freq: Two times a day (BID) | ORAL | 0 refills | Status: DC

## 2020-07-27 NOTE — Patient Instructions (Signed)
Eucerin, gold bond, and/or aquaphor

## 2020-07-27 NOTE — Telephone Encounter (Signed)
   Notes to clinic:   Please clarify the directions  for this prescription.  Requested Prescriptions  Pending Prescriptions Disp Refills   metoprolol succinate (TOPROL-XL) 50 MG 24 hr tablet [Pharmacy Med Name: METOPROLOL ER 50MG   TAB] 90 tablet 0    Sig: Take with or immediately following a meal.      Cardiovascular:  Beta Blockers Passed - 07/27/2020 10:49 AM      Passed - Last BP in normal range    BP Readings from Last 1 Encounters:  07/27/20 130/70          Passed - Last Heart Rate in normal range    Pulse Readings from Last 1 Encounters:  07/27/20 82          Passed - Valid encounter within last 6 months    Recent Outpatient Visits           3 months ago Visit for suture removal   [place], Adriana M, PA-C   6 months ago Type 2 diabetes mellitus without complication, without long-term current use of insulin [facility])   [place], [place], [place]   8 months ago [facility] to [employer] preventive visit   [place], [place], [place]   12 months ago Hypertension associated with diabetes [facility])   [place], [place], PA-C   1 year ago Type 2 diabetes mellitus without complication, without long-term current use of insulin [facility])   [facility] [place], [place], [place]       Future Appointments             In 3 months [place], Kirstie Peri, MD [facility], [place]

## 2020-07-27 NOTE — Progress Notes (Signed)
Complete physical exam   Patient: Billy Hardin   DOB: [birth year]   70 y.o. Male  MRN: [id number] Visit Date: 07/27/2020  Today's healthcare provider: Trinna Post, PA-C   Chief Complaint  Patient presents with  . Annual Exam   Subjective    Billy Hardin is a 70 y.o. male who presents today for a complete physical exam.  He reports that he tries to follow a diabetic diet. The patient does not participate in regular exercise at present. He generally feels well. He reports sleeping fairly well. He does have additional problems to discuss today.  HPI  Patient would like to discuss Omeprazole.  He states this was prescribed by another provider and if he is to stay on it he would like a refill.  Diabetes Mellitus Type II, Follow-up  Lab Results  Component Value Date   HGBA1C 6.8 (H) 07/27/2020   HGBA1C 8.6 (A) 01/29/2020   HGBA1C 7.2 (H) 07/29/2019   Wt Readings from Last 3 Encounters:  07/27/20 182 lb (82.6 kg)  05/28/20 175 lb (79.4 kg)  05/13/20 175 lb (79.4 kg)   Last seen for diabetes 4 months ago.  Management since then includes starting Rybelsus. This was too expensive and so farxiga was sent in. This was also too expensive for him and thus he never picked it up. He reports good compliance with treatment. He is not having side effects.  Symptoms: No fatigue No foot ulcerations  No appetite changes No nausea  No paresthesia of the feet  No polydipsia  No polyuria No visual disturbances   No vomiting     Home blood sugar records: fasting range: normal  Episodes of hypoglycemia? No    Current insulin regiment: none Most Recent Eye Exam: UTD Current exercise: aerobics Current diet habits: well balanced  Pertinent Labs: Lab Results  Component Value Date   CHOL 120 07/27/2020   HDL 50 07/27/2020   LDLCALC 50 07/27/2020   TRIG 111 07/27/2020   CHOLHDL 2.5 07/29/2019   Lab Results  Component Value Date   NA 140 07/27/2020   K 4.0 07/27/2020    CREATININE 1.11 07/27/2020   GFRNONAA >60 05/13/2020   GFRAA 71 07/29/2019   GLUCOSE 131 (H) 07/27/2020     --------------------------------------------------------------------------------------------------- Lipid/Cholesterol, Follow-up  Last lipid panel Other pertinent labs  Lab Results  Component Value Date   CHOL 120 07/27/2020   HDL 50 07/27/2020   LDLCALC 50 07/27/2020   TRIG 111 07/27/2020   CHOLHDL 2.5 07/29/2019   Lab Results  Component Value Date   ALT 16 07/27/2020   AST 15 07/27/2020   PLT 216 07/27/2020   TSH 3.000 07/27/2020     He was last seen for this 4 months ago.  Management since that visit includes continue statn.  He reports good compliance with treatment. He is not having side effects.   Symptoms: No chest pain No chest pressure/discomfort  No dyspnea No lower extremity edema  No numbness or tingling of extremity No orthopnea  No palpitations No paroxysmal nocturnal dyspnea  No speech difficulty No syncope   Current diet: well balanced Current exercise: aerobics  The ASCVD Risk score Billy Hardin) failed to calculate for the following reasons:   The valid total cholesterol range is 130 to 320 mg/dL  --------------------------------------------------------------------------------------------------- Lipid/Cholesterol, Follow-up  Last lipid panel Other pertinent labs  Lab Results  Component Value Date   CHOL 120 07/27/2020   HDL  50 07/27/2020   LDLCALC 50 07/27/2020   TRIG 111 07/27/2020   CHOLHDL 2.5 07/29/2019   Lab Results  Component Value Date   ALT 16 07/27/2020   AST 15 07/27/2020   PLT 216 07/27/2020   TSH 3.000 07/27/2020     He was last seen for this 4 months ago.  Management since that visit includes continue statin.  He reports excellent compliance with treatment. He is not having side effects.   Symptoms: No chest pain No chest pressure/discomfort  No dyspnea No lower extremity edema  No numbness or  tingling of extremity No orthopnea  No palpitations No paroxysmal nocturnal dyspnea  No speech difficulty No syncope   Current diet: well balanced Current exercise: aerobics  The ASCVD Risk score Billy Hardin) failed to calculate for the following reasons:   The valid total cholesterol range is 130 to 320 mg/dL  ---------------------------------------------------------------------------------------------------   Past Medical History:  Diagnosis Date  . Chronic migraine without aura without status migrainosus, not intractable 05/30/2016  . Complication of anesthesia    had catheter 4 days unable to void  . Deaf    Rt ear  . Diabetes mellitus without complication ([place])   . Elevated lipids   . Herpes   . Insomnia   . Seasonal allergies   . TMJ (dislocation of temporomandibular joint)   . Uses hearing aid    Left   Past Surgical History:  Procedure Laterality Date  . KIDNEY STONE SURGERY    . MIDDLE EAR SURGERY    . PENILE PROSTHESIS IMPLANT N/A 11/03/2014   Procedure: PENILE PROTHESIS INFLATABLE;  Surgeon: Royston Cowper, MD;  Location: [facility] ORS;  Service: Urology;  Laterality: N/A;  . TONSILLECTOMY AND ADENOIDECTOMY     Social History   Socioeconomic History  . Marital status: Married    Spouse name: Not on file  . Number of children: Not on file  . Years of education: Not on file  . Highest education level: Not on file  Occupational History  . Not on file  Tobacco Use  . Smoking status: Never Smoker  . Smokeless tobacco: Never Used  Vaping Use  . Vaping Use: Never used  Substance and Sexual Activity  . Alcohol use: Yes    Alcohol/week: 0.0 standard drinks    Comment: 1-2 drinks total of 2-3x a year  . Drug use: No  . Sexual activity: Yes    Partners: Female  Other Topics Concern  . Not on file  Social History Narrative  . Not on file   Social Determinants of Health   Financial Resource Strain: Not on file  Food Insecurity: Not on file   Transportation Needs: Not on file  Physical Activity: Not on file  Stress: Not on file  Social Connections: Not on file  Intimate Partner Violence: Not on file   Family Status  Relation Name Status  . Mother  Deceased at age 64       Cancer  . Father  Deceased at age 63       cancer  . Brother  Alive   Family History  Problem Relation Age of Onset  . Cancer Mother   . CAD Brother    Allergies  Allergen Reactions  . Bupropion     insomnia  . Sertraline     Ejaculatory dysfunction  . Penicillins Rash    Patient Care Team: Paulene Floor as PCP - General (Physician Assistant)  Medications: Outpatient Medications Prior to Visit  Medication Sig  . atorvastatin (LIPITOR) 20 MG tablet Take 1 tablet by mouth once daily  . blood glucose meter kit and supplies KIT Dispense based on patient and insurance preference. Check sugar daily  . cholecalciferol (VITAMIN D) 400 units TABS tablet Take 400 Units by mouth.  [name] CINNAMON PO Take 2 capsules by mouth daily.  [name] ibuprofen (ADVIL) 600 MG tablet Take 1 tablet (600 mg total) by mouth every 6 (six) hours as needed.  . methocarbamol (ROBAXIN) 500 MG tablet Take 1 tablet (500 mg total) by mouth 2 (two) times daily.  . mirtazapine (REMERON) 30 MG tablet TAKE 1 TABLET(30 MG) BY MOUTH AT BEDTIME  . Omega-3 Fatty Acids (OMEGA 3 500 PO) Take by mouth.  . ONE TOUCH ULTRA TEST test strip CHECK BLOOD SUGAR DAILY  . ONETOUCH DELICA LANCETS 54M MISC TEST BLOOD SUGAR DAILY  . traZODone (DESYREL) 50 MG tablet TAKE 1/2 TO 1 (ONE-HALF TO ONE) TABLET BY MOUTH AT BEDTIME AS NEEDED FOR SLEEP  . valACYclovir (VALTREX) 1000 MG tablet TAKE 1 TABLET(1000 MG) BY MOUTH DAILY  . venlafaxine XR (EFFEXOR-XR) 37.5 MG 24 hr capsule TAKE 1 CAPSULE BY MOUTH ONCE DAILY WITH BREAKFAST  . vitamin B-12 (CYANOCOBALAMIN) 1000 MCG tablet Take 1,000 mcg by mouth daily.  . vitamin E 400 UNIT capsule Take 400 Units by mouth 2 (two) times daily.  . [DISCONTINUED]  cephALEXin (KEFLEX) 500 MG capsule Take 1 capsule (500 mg total) by mouth 3 (three) times daily.  . [DISCONTINUED] dapagliflozin propanediol (FARXIGA) 5 MG TABS tablet Take 1 tablet (5 mg total) by mouth daily before breakfast.  . [DISCONTINUED] metFORMIN (GLUCOPHAGE) 1000 MG tablet TAKE 1 TABLET BY MOUTH TWICE DAILY WITH A MEAL  . [DISCONTINUED] metoprolol succinate (TOPROL-XL) 50 MG 24 hr tablet TAKE 1 TABLET BY MOUTH ONCE DAILY WITH  OR  IMMEDIATELY  FOLLOWING  A  MEAL  . [DISCONTINUED] omeprazole (PRILOSEC) 40 MG capsule One daily as needed for heartburn  . [DISCONTINUED] Semaglutide (RYBELSUS) 3 MG TABS Take one pill daily.  . [DISCONTINUED] sulfamethoxazole-trimethoprim (BACTRIM DS) 800-160 MG tablet Take 1 tablet by mouth 2 (two) times daily.   No facility-administered medications prior to visit.    Review of Systems  Constitutional: Negative.   HENT: Negative.   Eyes: Negative.   Respiratory: Negative.   Cardiovascular: Negative.   Gastrointestinal: Negative.   Endocrine: Negative.   Genitourinary: Negative.   Musculoskeletal: Negative.   Skin: Positive for rash.  Allergic/Immunologic: Negative.   Neurological: Negative.   Hematological: Negative.   Psychiatric/Behavioral: Positive for sleep disturbance.      Objective    BP 130/70 (BP Location: Right Arm, Patient Position: Sitting, Cuff Size: Normal)   Pulse 82   Temp 98.2 F (36.8 C) (Oral)   Wt 182 lb (82.6 kg)   SpO2 99%   BMI 24.68 kg/m    Physical Exam Constitutional:      Appearance: Normal appearance. He is normal weight.  HENT:     Head: Normocephalic and atraumatic.     Right Ear: Tympanic membrane, ear canal and external ear normal.     Left Ear: Tympanic membrane, ear canal and external ear normal.     Nose: Nose normal.     Mouth/Throat:     Mouth: Mucous membranes are moist.     Pharynx: Oropharynx is clear.  Eyes:     Extraocular Movements: Extraocular movements intact.      Conjunctiva/sclera: Conjunctivae  normal.     Pupils: Pupils are equal, round, and reactive to light.  Cardiovascular:     Rate and Rhythm: Normal rate and regular rhythm.     Pulses: Normal pulses.     Heart sounds: Normal heart sounds.  Pulmonary:     Effort: Pulmonary effort is normal.     Breath sounds: Normal breath sounds.  Abdominal:     General: Abdomen is flat. Bowel sounds are normal.     Palpations: Abdomen is soft.  Musculoskeletal:        General: Normal range of motion.     Cervical back: Normal range of motion and neck supple.  Skin:    General: Skin is warm and dry.  Neurological:     General: No focal deficit present.     Mental Status: He is alert and oriented to person, place, and time. Mental status is at baseline.  Psychiatric:        Mood and Affect: Mood normal.        Behavior: Behavior normal.        Thought Content: Thought content normal.        Judgment: Judgment normal.       Last depression screening scores PHQ 2/9 Scores 07/27/2020 04/12/2020 01/29/2020  PHQ - 2 Score 0 0 0  PHQ- 9 Score 3 3 4    Last fall risk screening Fall Risk  07/27/2020  Falls in the past year? 0  Number falls in past yr: 0  Comment -  Injury with Fall? 0  Comment -  Risk for fall due to : -  Follow up -   Last Audit-C alcohol use screening Alcohol Use Disorder Test (AUDIT) 07/27/2020  1. How often do you have a drink containing alcohol? 0  2. How many drinks containing alcohol do you have on a typical day when you are drinking? 0  3. How often do you have six or more drinks on one occasion? 0  AUDIT-C Score 0  Alcohol Brief Interventions/Follow-up AUDIT Score <7 follow-up not indicated   A score of 3 or more in women, and 4 or more in men indicates increased risk for alcohol abuse, EXCEPT if all of the points are from question 1   Results for orders placed or performed in visit on 07/27/20  CBC with Differential/Platelet  Result Value Ref Range   WBC 6.1 3.4 -  10.8 x10E3/uL   RBC 4.42 4.14 - 5.80 x10E6/uL   Hemoglobin 14.2 13.0 - 17.7 g/dL   Hematocrit 41.2 37.5 - 51.0 %   MCV 93 79 - 97 fL   MCH 32.1 26.6 - 33.0 pg   MCHC 34.5 31.5 - 35.7 g/dL   RDW 12.2 11.6 - 15.4 %   Platelets 216 150 - 450 x10E3/uL   Neutrophils 52 Not Estab. %   Lymphs 33 Not Estab. %   Monocytes 10 Not Estab. %   Eos 4 Not Estab. %   Basos 1 Not Estab. %   Neutrophils Absolute 3.2 1.4 - 7.0 x10E3/uL   Lymphocytes Absolute 2.0 0.7 - 3.1 x10E3/uL   Monocytes Absolute 0.6 0.1 - 0.9 x10E3/uL   EOS (ABSOLUTE) 0.3 0.0 - 0.4 x10E3/uL   Basophils Absolute 0.1 0.0 - 0.2 x10E3/uL   Immature Granulocytes 0 Not Estab. %   Immature Grans (Abs) 0.0 0.0 - 0.1 x10E3/uL  Comprehensive metabolic panel  Result Value Ref Range   Glucose 131 (H) 65 - 99 mg/dL   BUN 16 8 -  27 mg/dL   Creatinine, Ser 1.11 0.76 - 1.27 mg/dL   eGFR 72 >59 mL/min/1.73   BUN/Creatinine Ratio 14 10 - 24   Sodium 140 134 - 144 mmol/L   Potassium 4.0 3.5 - 5.2 mmol/L   Chloride 103 96 - 106 mmol/L   CO2 23 20 - 29 mmol/L   Calcium 10.1 8.6 - 10.2 mg/dL   Total Protein 6.8 6.0 - 8.5 g/dL   Albumin 4.8 3.8 - 4.8 g/dL   Globulin, Total 2.0 1.5 - 4.5 g/dL   Albumin/Globulin Ratio 2.4 (H) 1.2 - 2.2   Bilirubin Total 0.4 0.0 - 1.2 mg/dL   Alkaline Phosphatase 61 44 - 121 IU/L   AST 15 0 - 40 IU/L   ALT 16 0 - 44 IU/L  Hemoglobin A1c  Result Value Ref Range   Hgb A1c MFr Bld 6.8 (H) 4.8 - 5.6 %   Est. average glucose Bld gHb Est-mCnc 148 mg/dL  Lipid Panel With LDL/HDL Ratio  Result Value Ref Range   Cholesterol, Total 120 100 - 199 mg/dL   Triglycerides 111 0 - 149 mg/dL   HDL 50 >39 mg/dL   VLDL Cholesterol Cal 20 5 - 40 mg/dL   LDL Chol Calc (NIH) 50 0 - 99 mg/dL   LDL/HDL Ratio 1.0 0.0 - 3.6 ratio  TSH  Result Value Ref Range   TSH 3.000 0.450 - 4.500 uIU/mL  PSA  Result Value Ref Range   Prostate Specific Ag, Serum 1.5 0.0 - 4.0 ng/mL  Microalbumin / creatinine urine ratio  Result Value  Ref Range   Creatinine, Urine 152.3 Not Estab. mg/dL   Microalbumin, Urine 4.8 Not Estab. ug/mL   Microalb/Creat Ratio 3 0 - 29 mg/g creat    Assessment & Plan    Routine Health Maintenance and Physical Exam  Exercise Activities and Dietary recommendations Goals   None     Immunization History  Administered Date(s) Administered  . Fluad Quad(high Dose 65+) 02/05/2019, 01/29/2020  . Influenza-Unspecified 02/13/2015, 04/01/2016, 03/16/2017  . PFIZER(Purple Top)SARS-COV-2 Vaccination 07/30/2019, 08/20/2019, 05/31/2020  . Pneumococcal Conjugate-13 05/30/2016  . Pneumococcal Polysaccharide-23 05/26/2015  . Td 06/04/2017  . Tdap 01/16/2007, 05/28/2020  . Zoster 06/04/2012    Health Maintenance  Topic Date Due  . Hepatitis C Screening  Never done  . PNA vac Low Risk Adult (2 of 2 - PPSV23) 05/25/2020  . HEMOGLOBIN A1C  01/27/2021  . OPHTHALMOLOGY EXAM  02/09/2021  . FOOT EXAM  07/27/2021  . URINE MICROALBUMIN  07/27/2021  . COLONOSCOPY (Pts 45-59yr Insurance coverage will need to be confirmed)  09/29/2026  . TETANUS/TDAP  05/28/2030  . INFLUENZA VACCINE  Completed  . COVID-19 Vaccine  Completed  . HPV VACCINES  Aged Out    Discussed health benefits of physical activity, and encouraged him to engage in regular exercise appropriate for his age and condition.   1. Annual physical exam   2. [place] diabetes [place]  Continue medications.   - CBC with Differential/Platelet - Comprehensive metabolic panel - Lipid Panel With LDL/HDL Ratio - TSH  3. Type 2 diabetes mellitus without complication, without long-term current use of insulin (HCC)  - Hemoglobin A1c - Lipid Panel With LDL/HDL Ratio - metFORMIN (GLUCOPHAGE) 1000 MG tablet; Take 1 tablet (1,000 mg total) by mouth 2 (two) times daily with a meal.  Dispense: 180 tablet; Refill: 0 - Urine Microalbumin w/creat. ratio  4. Hyperlipidemia associated with type 2 diabetes mellitus (HCC)  -  Hemoglobin  A1c - Lipid Panel With LDL/HDL Ratio  5. Male hypogonadism   6. Prostate cancer screening  - PSA  7. Heartburn  - omeprazole (PRILOSEC) 40 MG capsule; One daily as needed for heartburn  Dispense: 90 capsule; Refill: 1  8. Chronic migraine without aura without status migrainosus, not intractable  - metoprolol succinate (TOPROL-XL) 50 MG 24 hr tablet; Take with or immediately following a meal.  Dispense: 90 tablet; Refill: 0  9. Other depression   10. Insomnia, unspecified type   11. Need for vaccination against Streptococcus pneumoniae     Return in about 4 months (around 11/26/2020).     ITrinna Post, PA-C, have reviewed all documentation for this visit. The documentation on 07/29/20 for the exam, diagnosis, procedures, and orders are all accurate and complete.  The entirety of the information documented in the History of Present Illness, Review of Systems and Physical Exam were personally obtained by me. Portions of this information were initially documented by [facility] and reviewed by me for thoroughness and accuracy.     Paulene Floor  [facility] [phone] (phone) [phone] (fax)  [place]

## 2020-07-28 LAB — CBC WITH DIFFERENTIAL/PLATELET
Basophils Absolute: 0.1 10*3/uL (ref 0.0–0.2)
Basos: 1 %
EOS (ABSOLUTE): 0.3 10*3/uL (ref 0.0–0.4)
Eos: 4 %
Hematocrit: 41.2 % (ref 37.5–51.0)
Hemoglobin: 14.2 g/dL (ref 13.0–17.7)
Immature Grans (Abs): 0 10*3/uL (ref 0.0–0.1)
Immature Granulocytes: 0 %
Lymphocytes Absolute: 2 10*3/uL (ref 0.7–3.1)
Lymphs: 33 %
MCH: 32.1 pg (ref 26.6–33.0)
MCHC: 34.5 g/dL (ref 31.5–35.7)
MCV: 93 fL (ref 79–97)
Monocytes Absolute: 0.6 10*3/uL (ref 0.1–0.9)
Monocytes: 10 %
Neutrophils Absolute: 3.2 10*3/uL (ref 1.4–7.0)
Neutrophils: 52 %
Platelets: 216 10*3/uL (ref 150–450)
RBC: 4.42 x10E6/uL (ref 4.14–5.80)
RDW: 12.2 % (ref 11.6–15.4)
WBC: 6.1 10*3/uL (ref 3.4–10.8)

## 2020-07-28 LAB — LIPID PANEL WITH LDL/HDL RATIO
Cholesterol, Total: 120 mg/dL (ref 100–199)
HDL: 50 mg/dL (ref >39)
LDL Chol Calc (NIH): 50 mg/dL (ref 0–99)
LDL/HDL Ratio: 1 ratio (ref 0.0–3.6)
Triglycerides: 111 mg/dL (ref 0–149)
VLDL Cholesterol Cal: 20 mg/dL (ref 5–40)

## 2020-07-28 LAB — COMPREHENSIVE METABOLIC PANEL
ALT: 16 IU/L (ref 0–44)
AST: 15 IU/L (ref 0–40)
Albumin/Globulin Ratio: 2.4 — ABNORMAL HIGH (ref 1.2–2.2)
Albumin: 4.8 g/dL (ref 3.8–4.8)
Alkaline Phosphatase: 61 IU/L (ref 44–121)
BUN/Creatinine Ratio: 14 (ref 10–24)
BUN: 16 mg/dL (ref 8–27)
Bilirubin Total: 0.4 mg/dL (ref 0.0–1.2)
CO2: 23 mmol/L (ref 20–29)
Calcium: 10.1 mg/dL (ref 8.6–10.2)
Chloride: 103 mmol/L (ref 96–106)
Creatinine, Ser: 1.11 mg/dL (ref 0.76–1.27)
Globulin, Total: 2 g/dL (ref 1.5–4.5)
Glucose: 131 mg/dL — ABNORMAL HIGH (ref 65–99)
Potassium: 4 mmol/L (ref 3.5–5.2)
Sodium: 140 mmol/L (ref 134–144)
Total Protein: 6.8 g/dL (ref 6.0–8.5)
eGFR: 72 mL/min/{1.73_m2} (ref >59)

## 2020-07-28 LAB — PSA: Prostate Specific Ag, Serum: 1.5 ng/mL (ref 0.0–4.0)

## 2020-07-28 LAB — HEMOGLOBIN A1C
Est. average glucose Bld gHb Est-mCnc: 148 mg/dL
Hgb A1c MFr Bld: 6.8 % — ABNORMAL HIGH (ref 4.8–5.6)

## 2020-07-28 LAB — MICROALBUMIN / CREATININE URINE RATIO
Creatinine, Urine: 152.3 mg/dL
Microalb/Creat Ratio: 3 mg/g creat (ref 0–29)
Microalbumin, Urine: 4.8 ug/mL

## 2020-07-28 LAB — TSH: TSH: 3 u[IU]/mL (ref 0.450–4.500)

## 2020-08-24 ENCOUNTER — Other Ambulatory Visit: Payer: Self-pay | Admitting: Physician Assistant

## 2020-08-24 DIAGNOSIS — G4700 Insomnia, unspecified: Secondary | ICD-10-CM

## 2020-08-24 DIAGNOSIS — F3289 Other specified depressive episodes: Secondary | ICD-10-CM

## 2020-09-03 DIAGNOSIS — M1711 Unilateral primary osteoarthritis, right knee: Secondary | ICD-10-CM | POA: Diagnosis not present

## 2020-09-28 NOTE — Telephone Encounter (Signed)
Error

## 2020-10-25 ENCOUNTER — Other Ambulatory Visit: Payer: Self-pay

## 2020-10-25 NOTE — Telephone Encounter (Signed)
[facility] faxed refill request for the following medications:  atorvastatin (LIPITOR) 20 MG tablet  traZODone (DESYREL) 50 MG tablet  Please advise.

## 2020-10-26 MED ORDER — ATORVASTATIN CALCIUM 20 MG PO TABS: 1.0000 | ORAL_TABLET | Freq: Every day | ORAL | 0 refills | Status: DC

## 2020-10-26 MED ORDER — TRAZODONE HCL 50 MG PO TABS: ORAL_TABLET | ORAL | 0 refills | Status: DC

## 2020-10-29 ENCOUNTER — Encounter: Payer: Self-pay | Admitting: Family Medicine

## 2020-10-29 ENCOUNTER — Other Ambulatory Visit: Payer: Self-pay

## 2020-10-29 ENCOUNTER — Ambulatory Visit (INDEPENDENT_AMBULATORY_CARE_PROVIDER_SITE_OTHER): Payer: Medicare HMO | Admitting: Family Medicine

## 2020-10-29 VITALS — BP 127/69 | HR 82 | Temp 97.5°F | Resp 18 | Wt 182.0 lb

## 2020-10-29 DIAGNOSIS — I152 Hypertension secondary to endocrine disorders: Secondary | ICD-10-CM | POA: Diagnosis not present

## 2020-10-29 DIAGNOSIS — E119 Type 2 diabetes mellitus without complications: Secondary | ICD-10-CM | POA: Diagnosis not present

## 2020-10-29 DIAGNOSIS — E1159 Type 2 diabetes mellitus with other circulatory complications: Secondary | ICD-10-CM | POA: Diagnosis not present

## 2020-10-29 DIAGNOSIS — F3289 Other specified depressive episodes: Secondary | ICD-10-CM | POA: Diagnosis not present

## 2020-10-29 DIAGNOSIS — G4700 Insomnia, unspecified: Secondary | ICD-10-CM | POA: Diagnosis not present

## 2020-10-29 LAB — POCT GLYCOSYLATED HEMOGLOBIN (HGB A1C)
Est. average glucose Bld gHb Est-mCnc: 177
Hemoglobin A1C: 7.8 % — AB (ref 4.0–5.6)

## 2020-10-29 NOTE — Progress Notes (Signed)
Established patient visit   Patient: Billy Hardin   DOB: 1950/10/09   70 y.o. Male  MRN: [id number] Visit Date: 10/29/2020  Today's healthcare provider: Lelon Huh, MD   Chief Complaint  Patient presents with   Diabetes   Subjective    HPI   This is a previous patient of Fabio Bering new to me to establish and follow up chronic medications.  Diabetes Mellitus Type II, Follow-up  Lab Results  Component Value Date   HGBA1C 7.8 (A) 10/29/2020   HGBA1C 6.8 (H) 07/27/2020   HGBA1C 8.6 (A) 01/29/2020   Wt Readings from Last 3 Encounters:  10/29/20 182 lb (82.6 kg)  07/27/20 182 lb (82.6 kg)  05/28/20 175 lb (79.4 kg)   Last seen for diabetes 3 months ago (seen by Carles Collet, PA-C).  Management since then includes continuing same medications He reports good compliance with treatment. He is not having side effects.  Symptoms: No fatigue No foot ulcerations  No appetite changes No nausea  No paresthesia of the feet  No polydipsia  No polyuria No visual disturbances   No vomiting     Home blood sugar records: fasting range: 170's  Episodes of hypoglycemia? No    Current insulin regiment: none Most Recent Eye Exam: 02/10/2020 Current exercise: none Current diet habits: well balanced  Pertinent Labs: Lab Results  Component Value Date   CHOL 120 07/27/2020   HDL 50 07/27/2020   LDLCALC 50 07/27/2020   TRIG 111 07/27/2020   CHOLHDL 2.5 07/29/2019   Lab Results  Component Value Date   NA 140 07/27/2020   K 4.0 07/27/2020   CREATININE 1.11 07/27/2020   GFRNONAA >60 05/13/2020   GFRAA 71 07/29/2019   GLUCOSE 131 (H) 07/27/2020     ---------------------------------------------------------------------------------------------------  Depression, Follow-up  He  was last seen for this 3 months ago. Changes made at last visit include none.   He reports poor compliance with treatment. Patient reports that he is not taking Effexor. He states he doesn't  remember that medication being prescribed to him.      Depression screen [facility] 2/9 10/29/2020 07/27/2020 04/12/2020  Decreased Interest 0 0 0  Down, Depressed, Hopeless 0 0 0  PHQ - 2 Score 0 0 0  Altered sleeping 0 3 3  Tired, decreased energy 0 0 0  Change in appetite 0 0 0  Feeling bad or failure about yourself  0 0 0  Trouble concentrating 0 0 0  Moving slowly or fidgety/restless 0 0 0  Suicidal thoughts 0 0 0  PHQ-9 Score 0 3 3  Difficult doing work/chores Not difficult at all Not difficult at all Not difficult at all    -----------------------------------------------------------------------------------------   Follow up for insomnia:  The patient was last seen for this 3 months ago. Changes made at last visit include none.  He reports good compliance with treatment. He feels that condition is Unchanged. He is not having side effects.   -----------------------------------------------------------------------------------------      Medications: Outpatient Medications Prior to Visit  Medication Sig   atorvastatin (LIPITOR) 20 MG tablet Take 1 tablet (20 mg total) by mouth daily.   blood glucose meter kit and supplies KIT Dispense based on patient and insurance preference. Check sugar daily   cholecalciferol (VITAMIN D) 400 units TABS tablet Take 400 Units by mouth.   CINNAMON PO Take 2 capsules by mouth daily.   ibuprofen (ADVIL) 600 MG tablet Take 1 tablet (600 mg total)  by mouth every 6 (six) hours as needed.   metFORMIN (GLUCOPHAGE) 1000 MG tablet Take 1 tablet (1,000 mg total) by mouth 2 (two) times daily with a meal.   methocarbamol (ROBAXIN) 500 MG tablet Take 1 tablet (500 mg total) by mouth 2 (two) times daily.   metoprolol succinate (TOPROL-XL) 50 MG 24 hr tablet Take with or immediately following a meal.   mirtazapine (REMERON) 30 MG tablet TAKE 1 TABLET BY MOUTH AT BEDTIME   Omega-3 Fatty Acids (OMEGA 3 500 PO) Take by mouth.   omeprazole (PRILOSEC) 40 MG  capsule One daily as needed for heartburn   ONE TOUCH ULTRA TEST test strip CHECK BLOOD SUGAR DAILY   ONETOUCH DELICA LANCETS 01U MISC TEST BLOOD SUGAR DAILY   traZODone (DESYREL) 50 MG tablet TAKE 1/2 TO 1 (ONE-HALF TO ONE) TABLET BY MOUTH AT BEDTIME AS NEEDED FOR SLEEP   valACYclovir (VALTREX) 1000 MG tablet TAKE 1 TABLET(1000 MG) BY MOUTH DAILY   vitamin B-12 (CYANOCOBALAMIN) 1000 MCG tablet Take 1,000 mcg by mouth daily.   vitamin E 400 UNIT capsule Take 400 Units by mouth 2 (two) times daily.   venlafaxine XR (EFFEXOR-XR) 37.5 MG 24 hr capsule TAKE 1 CAPSULE BY MOUTH ONCE DAILY WITH BREAKFAST (Patient not taking: Reported on 10/29/2020)   No facility-administered medications prior to visit.    Review of Systems  Constitutional:  Negative for appetite change, chills and fever.  Respiratory:  Negative for chest tightness, shortness of breath and wheezing.   Cardiovascular:  Negative for chest pain and palpitations.  Gastrointestinal:  Negative for abdominal pain, nausea and vomiting.      Objective    BP 127/69 (BP Location: Left Arm, Patient Position: Sitting, Cuff Size: Normal)   Pulse 82   Temp (!) 97.5 F (36.4 C) (Temporal)   Resp 18   Wt 182 lb (82.6 kg)   BMI 24.68 kg/m     Physical Exam  General appearance: Well developed, well nourished male, cooperative and in no acute distress Head: Normocephalic, without obvious abnormality, atraumatic Respiratory: Respirations even and unlabored, normal respiratory rate Extremities: All extremities are intact.  Skin: Skin color, texture, turgor normal. No rashes seen  Psych: Appropriate mood and affect. Neurologic: Mental status: Alert, oriented to person, place, and time, thought content appropriate.   Results for orders placed or performed in visit on 10/29/20  POCT HgB A1C  Result Value Ref Range   Hemoglobin A1C 7.8 (A) 4.0 - 5.6 %   Est. average glucose Bld gHb Est-mCnc 177     Assessment & Plan     1. Type 2  diabetes mellitus without complication, without long-term current use of insulin (HCC) A1c noted to be up by a full point since last visit. He plans on starting regular exercise and weight lifting regiment. Will recheck A1c in 3-4 months. Continue current medications.    2. Hypertension associated with diabetes ([place]) BP well controlled, Continue current medications.    3. Other depression Doing well with mirtazapine with no adverse effects.   4. Insomnia, unspecified type Reports he is sleeping well with current regiment of mirtazapine and trazodone, will continue unchanged.    Future Appointments  Date Time Provider [place]  03/07/2021  8:20 AM Birdie Sons, MD [id number]        The entirety of the information documented in the History of Present Illness, Review of Systems and Physical Exam were personally obtained by me. Portions of this information were initially  documented by the CMA and reviewed by me for thoroughness and accuracy.     Lelon Huh, MD  [facility] [phone] (phone) [phone] (fax)  [place]

## 2020-11-03 DIAGNOSIS — M1711 Unilateral primary osteoarthritis, right knee: Secondary | ICD-10-CM | POA: Diagnosis not present

## 2020-11-23 ENCOUNTER — Telehealth: Payer: Self-pay

## 2020-11-23 DIAGNOSIS — R12 Heartburn: Secondary | ICD-10-CM

## 2020-11-23 DIAGNOSIS — E119 Type 2 diabetes mellitus without complications: Secondary | ICD-10-CM

## 2020-11-23 NOTE — Telephone Encounter (Signed)
[place] faxed refill request for the following medications:  metFORMIN (GLUCOPHAGE) 1000 MG tablet   omeprazole (PRILOSEC) 40 MG capsule  Please advise.

## 2020-11-25 MED ORDER — OMEPRAZOLE 40 MG PO CPDR: DELAYED_RELEASE_CAPSULE | ORAL | 1 refills | Status: DC

## 2020-11-25 MED ORDER — METFORMIN HCL 1000 MG PO TABS: 1000.0000 mg | ORAL_TABLET | Freq: Two times a day (BID) | ORAL | 1 refills | Status: DC

## 2020-11-25 NOTE — Telephone Encounter (Signed)
RX sent to [facility].   Thanks,   -Mickel Baas

## 2020-11-25 NOTE — Telephone Encounter (Signed)
Pt is calling checking on medications refills

## 2020-11-30 ENCOUNTER — Telehealth: Payer: Self-pay | Admitting: Family Medicine

## 2020-11-30 DIAGNOSIS — G43709 Chronic migraine without aura, not intractable, without status migrainosus: Secondary | ICD-10-CM

## 2020-11-30 MED ORDER — METOPROLOL SUCCINATE ER 50 MG PO TB24: ORAL_TABLET | ORAL | 1 refills | Status: DC

## 2020-11-30 NOTE — Telephone Encounter (Signed)
[facility] faxed refill request for the following medications:  metoprolol succinate (TOPROL-XL) 50 MG 24 hr tablet  Last Rx: 07/27/20 Qty: 90 Refills: 0 LOV: 10/29/20 Please advise. Thanks TNP

## 2020-12-01 ENCOUNTER — Other Ambulatory Visit: Payer: Self-pay | Admitting: Family Medicine

## 2020-12-01 DIAGNOSIS — F3289 Other specified depressive episodes: Secondary | ICD-10-CM

## 2020-12-01 DIAGNOSIS — G4700 Insomnia, unspecified: Secondary | ICD-10-CM

## 2021-02-02 ENCOUNTER — Other Ambulatory Visit: Payer: Self-pay | Admitting: Family Medicine

## 2021-02-14 DIAGNOSIS — H524 Presbyopia: Secondary | ICD-10-CM | POA: Diagnosis not present

## 2021-02-14 DIAGNOSIS — E089 Diabetes mellitus due to underlying condition without complications: Secondary | ICD-10-CM | POA: Diagnosis not present

## 2021-02-14 DIAGNOSIS — H2513 Age-related nuclear cataract, bilateral: Secondary | ICD-10-CM | POA: Diagnosis not present

## 2021-02-14 DIAGNOSIS — E119 Type 2 diabetes mellitus without complications: Secondary | ICD-10-CM | POA: Diagnosis not present

## 2021-02-17 DIAGNOSIS — Z0100 Encounter for examination of eyes and vision without abnormal findings: Secondary | ICD-10-CM | POA: Diagnosis not present

## 2021-03-07 ENCOUNTER — Encounter: Payer: Self-pay | Admitting: Family Medicine

## 2021-03-07 ENCOUNTER — Other Ambulatory Visit: Payer: Self-pay

## 2021-03-07 ENCOUNTER — Other Ambulatory Visit: Payer: Self-pay | Admitting: Family Medicine

## 2021-03-07 ENCOUNTER — Ambulatory Visit (INDEPENDENT_AMBULATORY_CARE_PROVIDER_SITE_OTHER): Payer: Medicare HMO | Admitting: Family Medicine

## 2021-03-07 VITALS — BP 136/68 | HR 84 | Temp 98.4°F | Resp 100 | Wt 182.0 lb

## 2021-03-07 DIAGNOSIS — Z23 Encounter for immunization: Secondary | ICD-10-CM

## 2021-03-07 DIAGNOSIS — E785 Hyperlipidemia, unspecified: Secondary | ICD-10-CM | POA: Diagnosis not present

## 2021-03-07 DIAGNOSIS — F3289 Other specified depressive episodes: Secondary | ICD-10-CM

## 2021-03-07 DIAGNOSIS — E119 Type 2 diabetes mellitus without complications: Secondary | ICD-10-CM | POA: Diagnosis not present

## 2021-03-07 DIAGNOSIS — G4700 Insomnia, unspecified: Secondary | ICD-10-CM

## 2021-03-07 DIAGNOSIS — E1159 Type 2 diabetes mellitus with other circulatory complications: Secondary | ICD-10-CM | POA: Diagnosis not present

## 2021-03-07 DIAGNOSIS — I152 Hypertension secondary to endocrine disorders: Secondary | ICD-10-CM | POA: Diagnosis not present

## 2021-03-07 DIAGNOSIS — E1169 Type 2 diabetes mellitus with other specified complication: Secondary | ICD-10-CM | POA: Diagnosis not present

## 2021-03-07 LAB — POCT GLYCOSYLATED HEMOGLOBIN (HGB A1C): Hemoglobin A1C: 7.1 % — AB (ref 4.0–5.6)

## 2021-03-07 NOTE — Telephone Encounter (Signed)
Requested Prescriptions  Pending Prescriptions Disp Refills  . mirtazapine (REMERON) 30 MG tablet [Pharmacy Med Name: Mirtazapine 30 MG Oral Tablet] 90 tablet 0    Sig: TAKE 1 TABLET BY MOUTH AT BEDTIME     Psychiatry: Antidepressants - mirtazapine Passed - 03/07/2021  7:31 AM      Passed - AST in normal range and within 360 days    AST  Date Value Ref Range Status  07/27/2020 15 0 - 40 IU/L Final   SGOT(AST)  Date Value Ref Range Status  05/27/2014 15 15 - 37 Unit/L Final         Passed - ALT in normal range and within 360 days    ALT  Date Value Ref Range Status  07/27/2020 16 0 - 44 IU/L Final   SGPT (ALT)  Date Value Ref Range Status  05/27/2014 35 U/L Final    Comment:    14-63 NOTE: New Reference Range 12/02/13          Passed - Triglycerides in normal range and within 360 days    Triglycerides  Date Value Ref Range Status  07/27/2020 111 0 - 149 mg/dL Final  11/04/2012 296 (H) 0 - 200 mg/dL Final         Passed - Total Cholesterol in normal range and within 360 days    Cholesterol, Total  Date Value Ref Range Status  07/27/2020 120 100 - 199 mg/dL Final   Cholesterol  Date Value Ref Range Status  11/04/2012 165 0 - 200 mg/dL Final         Passed - WBC in normal range and within 360 days    WBC  Date Value Ref Range Status  07/27/2020 6.1 3.4 - 10.8 x10E3/uL Final  05/13/2020 5.1 4.0 - 10.5 K/uL Final         Passed - Completed PHQ-2 or PHQ-9 in the last 360 days      Passed - Valid encounter within last 6 months    Recent Outpatient Visits          Today Type 2 diabetes mellitus without complication, without long-term current use of insulin [facility])   [facility] Birdie Sons, MD   4 months ago Type 2 diabetes mellitus without complication, without long-term current use of insulin [facility])   [facility] Birdie Sons, MD   10 months ago Visit for suture removal   [facility] [place], Fabio Bering M, [place]    1 year ago Type 2 diabetes mellitus without complication, without long-term current use of insulin [facility])   [facility] [place], [place], [place]   1 year ago Welcome to [employer] preventive visit   [place], [address], [place]

## 2021-03-07 NOTE — Progress Notes (Signed)
Established patient visit   Patient: Billy Hardin   DOB: [birth year]   70 y.o. Male  MRN: [id number] Visit Date: 03/07/2021  Today's healthcare provider: Lelon Huh, MD   Chief Complaint  Patient presents with   Hypertension   Diabetes    Subjective    HPI  Diabetes Mellitus Type II, follow-up  Lab Results  Component Value Date   HGBA1C 7.8 (A) 10/29/2020   HGBA1C 6.8 (H) 07/27/2020   HGBA1C 8.6 (A) 01/29/2020   Last seen for diabetes 4 months ago.  Management since then includes continuing the same treatment also work on diet and exercise. He reports excellent compliance with treatment. He is not having side effects.   Home blood sugar records:  mid 100's  Episodes of hypoglycemia? No    Current insulin regiment: None Most Recent Eye Exam: 02/10/2020  --------------------------------------------------------------------------------------------------- Hypertension, follow-up  BP Readings from Last 3 Encounters:  03/07/21 (!) 147/73  10/29/20 127/69  07/27/20 130/70   Wt Readings from Last 3 Encounters:  03/07/21 182 lb (82.6 kg)  10/29/20 182 lb (82.6 kg)  07/27/20 182 lb (82.6 kg)     He was last seen for hypertension 4 months ago.  BP at that visit was 127/69. Management since that visit includes no changes. He reports excellent compliance with treatment. He is not having side effects.  He is exercising. He is adherent to low salt diet.   Outside blood pressures are normal at home.  He does not smoke.  Use of agents associated with hypertension: none.   --------------------------------------------------------------------------------------------------- Lipid/Cholesterol, follow-up  Last Lipid Panel: Lab Results  Component Value Date   CHOL 120 07/27/2020   LDLCALC 50 07/27/2020   HDL 50 07/27/2020   TRIG 111 07/27/2020    He was last seen for this 7 months ago.  Management since that visit includes no changes.  He reports  excellent compliance with treatment. He is not having side effects.   Symptoms: No appetite changes No foot ulcerations  No chest pain No chest pressure/discomfort  No dyspnea No orthopnea  No fatigue No lower extremity edema  No palpitations No paroxysmal nocturnal dyspnea  No nausea Yes numbness or tingling of extremity  No polydipsia No polyuria  No speech difficulty No syncope    Last metabolic panel Lab Results  Component Value Date   GLUCOSE 131 (H) 07/27/2020   NA 140 07/27/2020   K 4.0 07/27/2020   BUN 16 07/27/2020   CREATININE 1.11 07/27/2020   EGFR 72 07/27/2020   GFRNONAA >60 05/13/2020   CALCIUM 10.1 07/27/2020   AST 15 07/27/2020   ALT 16 07/27/2020   The ASCVD Risk score (Arnett DK, et al., 2019) failed to calculate for the following reasons:   The valid total cholesterol range is 130 to 320 mg/dL  ---------------------------------------------------------------------------------------------------     Medications: Outpatient Medications Prior to Visit  Medication Sig   atorvastatin (LIPITOR) 20 MG tablet Take 1 tablet by mouth once daily   blood glucose meter kit and supplies KIT Dispense based on patient and insurance preference. Check sugar daily   cholecalciferol (VITAMIN D) 400 units TABS tablet Take 400 Units by mouth.   CINNAMON PO Take 2 capsules by mouth daily.   ibuprofen (ADVIL) 600 MG tablet Take 1 tablet (600 mg total) by mouth every 6 (six) hours as needed.   metFORMIN (GLUCOPHAGE) 1000 MG tablet Take 1 tablet (1,000 mg total) by mouth 2 (two) times daily with a  meal.   methocarbamol (ROBAXIN) 500 MG tablet Take 1 tablet (500 mg total) by mouth 2 (two) times daily.   metoprolol succinate (TOPROL-XL) 50 MG 24 hr tablet Take with or immediately following a meal.   mirtazapine (REMERON) 30 MG tablet TAKE 1 TABLET BY MOUTH AT BEDTIME   Omega-3 Fatty Acids (OMEGA 3 500 PO) Take by mouth.   omeprazole (PRILOSEC) 40 MG capsule One daily as needed  for heartburn   ONE TOUCH ULTRA TEST test strip CHECK BLOOD SUGAR DAILY   ONETOUCH DELICA LANCETS 10B MISC TEST BLOOD SUGAR DAILY   traZODone (DESYREL) 50 MG tablet TAKE 1/2 TO 1 (ONE-HALF TO ONE) TABLET BY MOUTH AT BEDTIME AS NEEDED FOR SLEEP   valACYclovir (VALTREX) 1000 MG tablet TAKE 1 TABLET(1000 MG) BY MOUTH DAILY   vitamin B-12 (CYANOCOBALAMIN) 1000 MCG tablet Take 1,000 mcg by mouth daily.   vitamin E 400 UNIT capsule Take 400 Units by mouth 2 (two) times daily.   No facility-administered medications prior to visit.    Review of Systems  Constitutional: Negative.   Respiratory: Negative.    Cardiovascular: Negative.   Gastrointestinal: Negative.   Endocrine: Negative.   Skin:  Negative for wound.  Neurological:  Positive for numbness. Negative for dizziness, light-headedness and headaches.      Objective    BP 136/68   Pulse 84   Temp 98.4 F (36.9 C) (Oral)   Resp (!) 100   Wt 182 lb (82.6 kg)   BMI 24.68 kg/m    Physical Exam  General appearance: Well developed, well nourished male, cooperative and in no acute distress Head: Normocephalic, without obvious abnormality, atraumatic Respiratory: Respirations even and unlabored, normal respiratory rate Extremities: All extremities are intact.  Skin: Skin color, texture, turgor normal. No rashes seen  Psych: Appropriate mood and affect. Neurologic: Mental status: Alert, oriented to person, place, and time, thought content appropriate.     Assessment & Plan     1. Type 2 diabetes mellitus without complication, without long-term current use of insulin (HCC) Well controlled.  Continue current medications.    2. Hypertension associated with diabetes ([place]) Well controlled.  Continue current medications.    3. Hyperlipidemia associated with type 2 diabetes mellitus ([place]) He is tolerating atorvastatin well with no adverse effects.    4. Need for influenza vaccination  - Flu Vaccine QUAD High Dose(Fluad)    Future Appointments  Date Time Provider [place]  07/20/2021  9:00 AM Caryn Section, Kirstie Peri, MD [facility]         The entirety of the information documented in the History of Present Illness, Review of Systems and Physical Exam were personally obtained by me. Portions of this information were initially documented by the CMA and reviewed by me for thoroughness and accuracy.     Lelon Huh, MD  [facility] [phone] (phone) [phone] (fax)  [place]

## 2021-05-02 ENCOUNTER — Other Ambulatory Visit: Payer: Self-pay | Admitting: Family Medicine

## 2021-05-02 NOTE — Telephone Encounter (Signed)
Requested Prescriptions  Pending Prescriptions Disp Refills   traZODone (DESYREL) 50 MG tablet [Pharmacy Med Name: traZODone HCl 50 MG Oral Tablet] 90 tablet 0    Sig: TAKE 1/2 TO 1 (ONE-HALF TO ONE) TABLET BY MOUTH AT BEDTIME AS NEEDED FOR SLEEP     Psychiatry: Antidepressants - Serotonin Modulator Passed - 05/02/2021  9:10 AM      Passed - Completed PHQ-2 or PHQ-9 in the last 360 days      Passed - Valid encounter within last 6 months    Recent Outpatient Visits          1 month ago Type 2 diabetes mellitus without complication, without long-term current use of insulin ([place])   [facility] Birdie Sons, MD   6 months ago Type 2 diabetes mellitus without complication, without long-term current use of insulin ([place])   [facility] Birdie Sons, MD   1 year ago Visit for suture removal   [facility] [place], Adriana M, PA-C   1 year ago Type 2 diabetes mellitus without complication, without long-term current use of insulin [facility] [place])   [facility] [place], [place], [place]   1 year ago Welcome to [employer] preventive visit   [employer], Adriana M, PA-C              atorvastatin (LIPITOR) 20 MG tablet [Pharmacy Med Name: Atorvastatin Calcium 20 MG Oral Tablet] 90 tablet 0    Sig: Take 1 tablet by mouth once daily     Cardiovascular:  Antilipid - Statins Passed - 05/02/2021  9:10 AM      Passed - Total Cholesterol in normal range and within 360 days    Cholesterol, Total  Date Value Ref Range Status  07/27/2020 120 100 - 199 mg/dL Final   Cholesterol  Date Value Ref Range Status  11/04/2012 165 0 - 200 mg/dL Final         Passed - LDL in normal range and within 360 days    Ldl Cholesterol, Calc  Date Value Ref Range Status  11/04/2012 65 0 - 100 mg/dL Final   LDL Chol Calc (NIH)  Date Value Ref Range Status  07/27/2020 50 0 - 99 mg/dL Final         Passed - HDL in normal range and within  360 days    HDL Cholesterol  Date Value Ref Range Status  11/04/2012 41 40 - 60 mg/dL Final   HDL  Date Value Ref Range Status  07/27/2020 50 >39 mg/dL Final         Passed - Triglycerides in normal range and within 360 days    Triglycerides  Date Value Ref Range Status  07/27/2020 111 0 - 149 mg/dL Final  11/04/2012 296 (H) 0 - 200 mg/dL Final         Passed - Patient is not pregnant      Passed - Valid encounter within last 12 months    Recent Outpatient Visits          1 month ago Type 2 diabetes mellitus without complication, without long-term current use of insulin ([place])   [facility] Birdie Sons, MD   6 months ago Type 2 diabetes mellitus without complication, without long-term current use of insulin [facility])   [facility] Birdie Sons, MD   1 year ago Visit for suture removal   [facility] Carles Collet [place], [place]   1  year ago Type 2 diabetes mellitus without complication, without long-term current use of insulin [place])   [facility] [place], Wendee [place], [place]   1 year ago Welcome to [employer] preventive visit   [place], [place], [place]

## 2021-05-06 ENCOUNTER — Encounter: Payer: Self-pay | Admitting: Family Medicine

## 2021-05-06 ENCOUNTER — Other Ambulatory Visit: Payer: Self-pay | Admitting: Family Medicine

## 2021-05-06 DIAGNOSIS — K130 Diseases of lips: Secondary | ICD-10-CM

## 2021-05-06 MED ORDER — VALACYCLOVIR HCL 1 G PO TABS: ORAL_TABLET | ORAL | 0 refills | Status: DC

## 2021-05-06 NOTE — Telephone Encounter (Signed)
Medication Refill - Medication:   valACYclovir (VALTREX) 1000 MG tablet  Has the patient contacted their pharmacy? Yes.  Pt stated he contacted the pharmacy and they are waiting for a response from the office.  (Agent: If no, request that the patient contact the pharmacy for the refill. If patient does not wish to contact the pharmacy document the reason why and proceed with request.) (Agent: If yes, when and what did the pharmacy advise?)  Preferred Pharmacy (with phone number or street name):   [place], [place] - [address]  [place] [address]  Phone: [phone] Fax: [phone]  Hours: Not open 24 hours    Has the patient been seen for an appointment in the last year OR does the patient have an upcoming appointment? Yes.    Agent: Please be advised that RX refills may take up to 3 business days. We ask that you follow-up with your pharmacy.

## 2021-05-06 NOTE — Telephone Encounter (Signed)
Requested Prescriptions  Pending Prescriptions Disp Refills   valACYclovir (VALTREX) 1000 MG tablet 90 tablet 3    Sig: TAKE 1 TABLET(1000 MG) BY MOUTH DAILY     Antimicrobials:  Antiviral Agents - Anti-Herpetic Passed - 05/06/2021  3:22 PM      Passed - Valid encounter within last 12 months    Recent Outpatient Visits          2 months ago Type 2 diabetes mellitus without complication, without long-term current use of insulin [facility])   [facility] Birdie Sons, MD   6 months ago Type 2 diabetes mellitus without complication, without long-term current use of insulin [facility])   [facility] Birdie Sons, MD   1 year ago Visit for suture removal   [facility] Carles Collet M, [place]   1 year ago Type 2 diabetes mellitus without complication, without long-term current use of insulin [facility])   [facility] [place], [place], [place]   1 year ago Welcome to [employer] preventive visit   [place], [place], [place]

## 2021-05-15 DEATH — deceased

## 2021-05-23 ENCOUNTER — Other Ambulatory Visit: Payer: Self-pay | Admitting: Family Medicine

## 2021-05-23 DIAGNOSIS — E119 Type 2 diabetes mellitus without complications: Secondary | ICD-10-CM

## 2021-05-23 NOTE — Telephone Encounter (Signed)
Requested Prescriptions  Pending Prescriptions Disp Refills   metFORMIN (GLUCOPHAGE) 1000 MG tablet [Pharmacy Med Name: metFORMIN HCl 1000 MG Oral Tablet] 180 tablet 0    Sig: TAKE 1 TABLET BY MOUTH 2 TIMES DAILY WITH A MEAL     Endocrinology:  Diabetes - Biguanides Passed - 05/23/2021 10:12 AM      Passed - Cr in normal range and within 360 days    Creat  Date Value Ref Range Status  02/06/2017 0.95 0.70 - 1.25 mg/dL Final    Comment:    For patients >22 years of age, the reference limit for Creatinine is approximately 13% higher for people identified as African-American. .    Creatinine, Ser  Date Value Ref Range Status  07/27/2020 1.11 0.76 - 1.27 mg/dL Final         Passed - HBA1C is between 0 and 7.9 and within 180 days    Hemoglobin A1C  Date Value Ref Range Status  03/07/2021 7.1 (A) 4.0 - 5.6 % Final    Comment:    Average 157  11/04/2012 6.8 (H) 4.2 - 6.3 % Final    Comment:    The American Diabetes Association recommends that a primary goal of therapy should be <7% and that physicians should reevaluate the treatment regimen in patients with HbA1c values consistently >8%.    Hgb A1c MFr Bld  Date Value Ref Range Status  07/27/2020 6.8 (H) 4.8 - 5.6 % Final    Comment:             Prediabetes: 5.7 - 6.4          Diabetes: >6.4          Glycemic control for adults with diabetes: <7.0          Passed - eGFR in normal range and within 360 days    GFR, Est African American  Date Value Ref Range Status  02/06/2017 97 > OR = 60 mL/min/1.32m Final   GFR calc Af Amer  Date Value Ref Range Status  07/29/2019 71 >59 mL/min/1.73 Final   GFR, Est Non African American  Date Value Ref Range Status  02/06/2017 84 > OR = 60 mL/min/1.765mFinal   GFR, Estimated  Date Value Ref Range Status  05/13/2020 >60 >60 mL/min Final    Comment:    (NOTE) Calculated using the CKD-EPI Creatinine Equation (2021)    eGFR  Date Value Ref Range Status  07/27/2020 72 >59  mL/min/1.73 Final         Passed - Valid encounter within last 6 months    Recent Outpatient Visits          2 months ago Type 2 diabetes mellitus without complication, without long-term current use of insulin ([place]  [facility]   6 months ago Type 2 diabetes mellitus without complication, without long-term current use of insulin ([facility]  [facility]   1 year ago Visit for suture removal   [facility] [place], [place] 1 year ago Type 2 diabetes mellitus without complication, without long-term current use of insulin ([facility] [place]  [facility]entwood[facility] Oak[place] 1 year ago Welcome to [employer]ive visit   [place]

## 2021-05-30 ENCOUNTER — Other Ambulatory Visit: Payer: Self-pay | Admitting: Family Medicine

## 2021-05-30 DIAGNOSIS — G43709 Chronic migraine without aura, not intractable, without status migrainosus: Secondary | ICD-10-CM

## 2021-05-30 NOTE — Telephone Encounter (Signed)
Requested Prescriptions  Pending Prescriptions Disp Refills   metoprolol succinate (TOPROL-XL) 50 MG 24 hr tablet [Pharmacy Med Name: Metoprolol Succinate ER 50 MG Oral Tablet Extended Release 24 Hour] 90 tablet 0    Sig: TAKE 1 TABLET BY MOUTH ONCE DAILY WITH  OR  IMMEDIATELY  FOLLOWING  A  MEAL     Cardiovascular:  Beta Blockers Passed - 05/30/2021 10:32 AM      Passed - Last BP in normal range    BP Readings from Last 1 Encounters:  03/07/21 136/68         Passed - Last Heart Rate in normal range    Pulse Readings from Last 1 Encounters:  03/07/21 84         Passed - Valid encounter within last 6 months    Recent Outpatient Visits          2 months ago Type 2 diabetes mellitus without complication, without long-term current use of insulin ([place])   [facility] Birdie Sons, MD   7 months ago Type 2 diabetes mellitus without complication, without long-term current use of insulin [facility])   [facility] Birdie Sons, MD   1 year ago Visit for suture removal   [facility] Carles Collet M, [place]   1 year ago Type 2 diabetes mellitus without complication, without long-term current use of insulin [facility])   [facility] [place], [place], [place]   1 year ago Welcome to [employer] preventive visit   [place], [place], [place]

## 2021-07-20 ENCOUNTER — Ambulatory Visit (INDEPENDENT_AMBULATORY_CARE_PROVIDER_SITE_OTHER): Payer: Medicare HMO | Admitting: Family Medicine

## 2021-07-20 ENCOUNTER — Other Ambulatory Visit: Payer: Self-pay | Admitting: Family Medicine

## 2021-07-20 ENCOUNTER — Other Ambulatory Visit: Payer: Self-pay

## 2021-07-20 DIAGNOSIS — E785 Hyperlipidemia, unspecified: Secondary | ICD-10-CM

## 2021-07-20 DIAGNOSIS — I152 Hypertension secondary to endocrine disorders: Secondary | ICD-10-CM

## 2021-07-20 DIAGNOSIS — E119 Type 2 diabetes mellitus without complications: Secondary | ICD-10-CM

## 2021-07-20 DIAGNOSIS — E1169 Type 2 diabetes mellitus with other specified complication: Secondary | ICD-10-CM

## 2021-07-20 DIAGNOSIS — Z91199 Patient's noncompliance with other medical treatment and regimen due to unspecified reason: Secondary | ICD-10-CM

## 2021-07-20 DIAGNOSIS — Z125 Encounter for screening for malignant neoplasm of prostate: Secondary | ICD-10-CM

## 2021-07-20 NOTE — Progress Notes (Unsigned)
Lab orders for upcoming CPE ?

## 2021-07-20 NOTE — Progress Notes (Deleted)
Erroneous encounter

## 2021-07-25 ENCOUNTER — Other Ambulatory Visit: Payer: Self-pay | Admitting: Family Medicine

## 2021-07-25 DIAGNOSIS — R12 Heartburn: Secondary | ICD-10-CM

## 2021-07-25 NOTE — Telephone Encounter (Signed)
Requested Prescriptions  ?Pending Prescriptions Disp Refills  ?? atorvastatin (LIPITOR) 20 MG tablet [Pharmacy Med Name: Atorvastatin Calcium 20 MG Oral Tablet] 90 tablet 0  ?  Sig: Take 1 tablet by mouth once daily  ?  ? Cardiovascular:  Antilipid - Statins Failed - 07/25/2021  9:28 AM  ?  ?  Failed - Lipid Panel in normal range within the last 12 months  ?  Cholesterol, Total  ?Date Value Ref Range Status  ?07/27/2020 120 100 - 199 mg/dL Final  ? ?Cholesterol  ?Date Value Ref Range Status  ?11/04/2012 165 0 - 200 mg/dL Final  ? ?Ldl Cholesterol, Calc  ?Date Value Ref Range Status  ?11/04/2012 65 0 - 100 mg/dL Final  ? ?LDL Chol Calc (NIH)  ?Date Value Ref Range Status  ?07/27/2020 50 0 - 99 mg/dL Final  ? ?HDL Cholesterol  ?Date Value Ref Range Status  ?11/04/2012 41 40 - 60 mg/dL Final  ? ?HDL  ?Date Value Ref Range Status  ?07/27/2020 50 >39 mg/dL Final  ? ?Triglycerides  ?Date Value Ref Range Status  ?07/27/2020 111 0 - 149 mg/dL Final  ?11/04/2012 296 (H) 0 - 200 mg/dL Final  ? ?  ?  ?  Passed - Patient is not pregnant  ?  ?  Passed - Valid encounter within last 12 months  ?  Recent Outpatient Visits   ?      ? 5 days ago   ? [facility] Birdie Sons, MD  ? 4 months ago Type 2 diabetes mellitus without complication, without long-term current use of insulin ([place])  ? [facility] Birdie Sons, MD  ? 8 months ago Type 2 diabetes mellitus without complication, without long-term current use of insulin ([place])  ? [facility] Caryn Section, Kirstie Peri, MD  ? 1 year ago Visit for suture removal  ? [facility] Carles Collet M, [place]  ? 1 year ago Type 2 diabetes mellitus without complication, without long-term current use of insulin ([place])  ? [place], [place]  ?  ?  ? ?  ?  ?  ?? omeprazole (PRILOSEC) 40 MG capsule [Pharmacy Med Name: Omeprazole 40 MG Oral Capsule Delayed Release] 90 capsule 0  ?  Sig: TAKE 1 CAPSULE BY MOUTH DAILY  AS NEEDED FOR HEARTBURN  ?  ? Gastroenterology: Proton Pump Inhibitors Passed - 07/25/2021  9:28 AM  ?  ?  Passed - Valid encounter within last 12 months  ?  Recent Outpatient Visits   ?      ? 5 days ago   ? [facility] Birdie Sons, MD  ? 4 months ago Type 2 diabetes mellitus without complication, without long-term current use of insulin ([place])  ? [facility] Birdie Sons, MD  ? 8 months ago Type 2 diabetes mellitus without complication, without long-term current use of insulin ([place])  ? [facility] Caryn Section, Kirstie Peri, MD  ? 1 year ago Visit for suture removal  ? [facility] Carles Collet M, [place]  ? 1 year ago Type 2 diabetes mellitus without complication, without long-term current use of insulin ([place])  ? [facility] [place], [place], [place]  ?  ?  ? ?  ?  ?  ? ? ?

## 2021-07-27 ENCOUNTER — Other Ambulatory Visit: Payer: Self-pay | Admitting: Family Medicine

## 2021-07-28 ENCOUNTER — Encounter: Payer: Self-pay | Admitting: Family Medicine

## 2021-07-28 NOTE — Telephone Encounter (Signed)
Requested Prescriptions  ?Pending Prescriptions Disp Refills  ?? traZODone (DESYREL) 50 MG tablet [Pharmacy Med Name: traZODone HCl 50 MG Oral Tablet] 90 tablet 0  ?  Sig: TAKE 1/2 TO 1 TABLET BY MOUTH AT BEDTIME AS NEEDED FOR SLEEP  ?  ? Psychiatry: Antidepressants - Serotonin Modulator Passed - 07/27/2021  3:48 PM  ?  ?  Passed - Completed PHQ-2 or PHQ-9 in the last 360 days  ?  ?  Passed - Valid encounter within last 6 months  ?  Recent Outpatient Visits   ?      ? 1 week ago   ? [facility] Birdie Sons, MD  ? 4 months ago Type 2 diabetes mellitus without complication, without long-term current use of insulin ([place])  ? [facility] Birdie Sons, MD  ? 9 months ago Type 2 diabetes mellitus without complication, without long-term current use of insulin ([place])  ? [facility] Caryn Section, Kirstie Peri, MD  ? 1 year ago Visit for suture removal  ? [facility] Carles Collet M, [place]  ? 1 year ago Type 2 diabetes mellitus without complication, without long-term current use of insulin ([place])  ? [facility] [place], [place], [place]  ?  ?  ? ?  ?  ?  ? ? ?

## 2021-07-29 DIAGNOSIS — E1169 Type 2 diabetes mellitus with other specified complication: Secondary | ICD-10-CM | POA: Diagnosis not present

## 2021-07-29 DIAGNOSIS — Z125 Encounter for screening for malignant neoplasm of prostate: Secondary | ICD-10-CM | POA: Diagnosis not present

## 2021-07-29 DIAGNOSIS — E785 Hyperlipidemia, unspecified: Secondary | ICD-10-CM | POA: Diagnosis not present

## 2021-07-29 DIAGNOSIS — E119 Type 2 diabetes mellitus without complications: Secondary | ICD-10-CM | POA: Diagnosis not present

## 2021-07-30 LAB — COMPREHENSIVE METABOLIC PANEL
ALT: 10 IU/L (ref 0–44)
AST: 10 IU/L (ref 0–40)
Albumin/Globulin Ratio: 2.3 — ABNORMAL HIGH (ref 1.2–2.2)
Albumin: 4.4 g/dL (ref 3.8–4.8)
Alkaline Phosphatase: 56 IU/L (ref 44–121)
BUN/Creatinine Ratio: 15 (ref 10–24)
BUN: 16 mg/dL (ref 8–27)
Bilirubin Total: 0.3 mg/dL (ref 0.0–1.2)
CO2: 25 mmol/L (ref 20–29)
Calcium: 9.5 mg/dL (ref 8.6–10.2)
Chloride: 104 mmol/L (ref 96–106)
Creatinine, Ser: 1.09 mg/dL (ref 0.76–1.27)
Globulin, Total: 1.9 g/dL (ref 1.5–4.5)
Glucose: 156 mg/dL — ABNORMAL HIGH (ref 70–99)
Potassium: 4.2 mmol/L (ref 3.5–5.2)
Sodium: 143 mmol/L (ref 134–144)
Total Protein: 6.3 g/dL (ref 6.0–8.5)
eGFR: 73 mL/min/{1.73_m2} (ref >59)

## 2021-07-30 LAB — CBC
Hematocrit: 38.8 % (ref 37.5–51.0)
Hemoglobin: 13.2 g/dL (ref 13.0–17.7)
MCH: 31.8 pg (ref 26.6–33.0)
MCHC: 34 g/dL (ref 31.5–35.7)
MCV: 94 fL (ref 79–97)
Platelets: 211 10*3/uL (ref 150–450)
RBC: 4.15 x10E6/uL (ref 4.14–5.80)
RDW: 12.7 % (ref 11.6–15.4)
WBC: 5.9 10*3/uL (ref 3.4–10.8)

## 2021-07-30 LAB — LIPID PANEL
Chol/HDL Ratio: 2.2 ratio (ref 0.0–5.0)
Cholesterol, Total: 110 mg/dL (ref 100–199)
HDL: 50 mg/dL (ref >39)
LDL Chol Calc (NIH): 43 mg/dL (ref 0–99)
Triglycerides: 89 mg/dL (ref 0–149)
VLDL Cholesterol Cal: 17 mg/dL (ref 5–40)

## 2021-07-30 LAB — HEMOGLOBIN A1C
Est. average glucose Bld gHb Est-mCnc: 148 mg/dL
Hgb A1c MFr Bld: 6.8 % — ABNORMAL HIGH (ref 4.8–5.6)

## 2021-07-30 LAB — PSA TOTAL (REFLEX TO FREE): Prostate Specific Ag, Serum: 1 ng/mL (ref 0.0–4.0)

## 2021-08-03 ENCOUNTER — Encounter: Payer: Self-pay | Admitting: Family Medicine

## 2021-08-03 ENCOUNTER — Other Ambulatory Visit: Payer: Self-pay

## 2021-08-03 ENCOUNTER — Ambulatory Visit (INDEPENDENT_AMBULATORY_CARE_PROVIDER_SITE_OTHER): Payer: Medicare HMO | Admitting: Family Medicine

## 2021-08-03 VITALS — BP 128/58 | HR 84 | Temp 98.0°F | Resp 14 | Ht 72.0 in | Wt 180.0 lb

## 2021-08-03 DIAGNOSIS — E119 Type 2 diabetes mellitus without complications: Secondary | ICD-10-CM

## 2021-08-03 DIAGNOSIS — Z289 Immunization not carried out for unspecified reason: Secondary | ICD-10-CM | POA: Diagnosis not present

## 2021-08-03 DIAGNOSIS — Z0000 Encounter for general adult medical examination without abnormal findings: Secondary | ICD-10-CM

## 2021-08-03 DIAGNOSIS — E1159 Type 2 diabetes mellitus with other circulatory complications: Secondary | ICD-10-CM

## 2021-08-03 DIAGNOSIS — I152 Hypertension secondary to endocrine disorders: Secondary | ICD-10-CM | POA: Diagnosis not present

## 2021-08-03 DIAGNOSIS — S46919S Strain of unspecified muscle, fascia and tendon at shoulder and upper arm level, unspecified arm, sequela: Secondary | ICD-10-CM

## 2021-08-03 MED ORDER — SHINGRIX 50 MCG/0.5ML IM SUSR: 0.5000 mL | Freq: Once | INTRAMUSCULAR | 0 refills | Status: AC

## 2021-08-03 NOTE — Progress Notes (Signed)
? ? ?I,Roshena L Chambers,acting as a scribe for Lelon Huh, MD.,have documented all relevant documentation on the behalf of Lelon Huh, MD,as directed by  Lelon Huh, MD while in the presence of Lelon Huh, MD.  ? ? Complete Physical Exam ? ?  ? ?Patient: Billy Hardin, Male    DOB: 05-16-1950, 71 y.o.   MRN: [id number] ?Visit Date: 08/03/2021 ? ?Today's Provider: Lelon Huh, MD  ? ? ?Subjective  ?  ?Billy Hardin is a 71 y.o. male who presents today for his complete physical examination  ?He reports consuming a general diet. The patient does not participate in regular exercise at present. He generally feels well. He reports sleeping well. He does have additional problems to discuss today.  ?Pt repeats left shoulder pain when moves in a certain way. Pt reports no injury.  Onset x 2 mo.  Reports intermittent pain when moved in certain direction.  Pt takes nothing for pain.  ? ?HPI ?Diabetes Mellitus Type II, Follow-up ? ?Lab Results  ?Component Value Date  ? HGBA1C 6.8 (H) 07/29/2021  ? HGBA1C 7.1 (A) 03/07/2021  ? HGBA1C 7.8 (A) 10/29/2020  ? ?Wt Readings from Last 3 Encounters:  ?03/07/21 182 lb (82.6 kg)  ?10/29/20 182 lb (82.6 kg)  ?07/27/20 182 lb (82.6 kg)  ? ?Last seen for diabetes 4 months ago.  ?Management since then includes continuing same medication. ?He reports excellent compliance with treatment. ?He is not having side effects. ?Symptoms: ?No fatigue No foot ulcerations  ?No appetite changes No nausea  ?No paresthesia of the feet  No polydipsia  ?No polyuria No visual disturbances   ?No vomiting   ? ? ?Home blood sugar records: fasting range: average 130's ? ?Episodes of hypoglycemia? No ?  ?Current insulin regiment: none ?Most Recent Eye Exam: request for result sent  ?Current exercise: none ?Current diet habits: in general, a "healthy" diet   ? ?Pertinent Labs: ?Lab Results  ?Component Value Date  ? CHOL 110 07/29/2021  ? HDL 50 07/29/2021  ? LDLCALC 43 07/29/2021  ? TRIG 89  07/29/2021  ? CHOLHDL 2.2 07/29/2021  ? Lab Results  ?Component Value Date  ? NA 143 07/29/2021  ? K 4.2 07/29/2021  ? CREATININE 1.09 07/29/2021  ? EGFR 73 07/29/2021  ? LABMICR 4.8 07/27/2020  ?  ? ?---------------------------------------------------------------------------------------------------  ? ?Hypertension, follow-up ? ?BP Readings from Last 3 Encounters:  ?08/03/21 (!) 128/58  ?03/07/21 136/68  ?10/29/20 127/69  ? Wt Readings from Last 3 Encounters:  ?03/07/21 182 lb (82.6 kg)  ?10/29/20 182 lb (82.6 kg)  ?07/27/20 182 lb (82.6 kg)  ?  ? ?He was last seen for hypertension 4 months ago.  ?BP at that visit was 136/68. Management since that visit includes continuing same medication. ? ?He reports excellent compliance with treatment. ?He is not having side effects. ?He is following a Regular diet. ?He is not exercising. ?He does not smoke. ? ?Use of agents associated with hypertension: none.  ? ?Outside blood pressures are no  ?Symptoms: ?No chest pain No chest pressure  ?No palpitations No syncope  ?No dyspnea No orthopnea  ?No paroxysmal nocturnal dyspnea No lower extremity edema  ? ? ?The ASCVD Risk score (Arnett DK, et al., 2019) failed to calculate for the following reasons: ?  The valid total cholesterol range is 130 to 320 mg/dL  ? ?---------------------------------------------------------------------------------------------------  ? ?Lipid/Cholesterol, Follow-up ? ?Last lipid panel Other pertinent labs  ?Lab Results  ?Component Value Date  ? CHOL  110 07/29/2021  ? HDL 50 07/29/2021  ? LDLCALC 43 07/29/2021  ? TRIG 89 07/29/2021  ? CHOLHDL 2.2 07/29/2021  ? Lab Results  ?Component Value Date  ? ALT 10 07/29/2021  ? AST 10 07/29/2021  ? PLT 211 07/29/2021  ? TSH 3.000 07/27/2020  ?  ? ?He was last seen for this 4 months ago.  ?Management since that visit includes continuing same medication. ? ?He reports excellent compliance with treatment. ?He is not having side effects.  ? ?Symptoms: ?No chest pain No  chest pressure/discomfort  ?No dyspnea No lower extremity edema  ?No numbness or tingling of extremity No orthopnea  ?No palpitations No paroxysmal nocturnal dyspnea  ?No speech difficulty No syncope  ? ?Current diet: in general, a "healthy" diet   ?Current exercise: none ? ?The ASCVD Risk score (Arnett DK, et al., 2019) failed to calculate for the following reasons: ?  The valid total cholesterol range is 130 to 320 mg/dL ? ?---------------------------------------------------------------------------------------------------  ? ?Depression, Follow-up ? ?He  was last seen for this 9 months ago. ?Changes made at last visit include none; continue same medication. ?  ?He reports excellent compliance with treatment. ?He is not having side effects. none ? ?He reports good tolerance of treatment. ?Current symptoms include: insomnia ?He feels he is Unchanged since last visit. ? ? ?  03/07/2021  ?  8:43 AM 10/29/2020  ?  8:48 AM 07/27/2020  ?  9:11 AM  ?Depression screen PHQ 2/9  ?Decreased Interest 0 0 0  ?Down, Depressed, Hopeless 0 0 0  ?PHQ - 2 Score 0 0 0  ?Altered sleeping 1 0 3  ?Tired, decreased energy 0 0 0  ?Change in appetite 0 0 0  ?Feeling bad or failure about yourself  0 0 0  ?Trouble concentrating 0 0 0  ?Moving slowly or fidgety/restless 0 0 0  ?Suicidal thoughts 0 0 0  ?PHQ-9 Score 1 0 3  ?Difficult doing work/chores Not difficult at all Not difficult at all Not difficult at all  ?  ?-----------------------------------------------------------------------------------------  ? ? ?Medications: ?Outpatient Medications Prior to Visit  ?Medication Sig  ? atorvastatin (LIPITOR) 20 MG tablet Take 1 tablet by mouth once daily  ? blood glucose meter kit and supplies KIT Dispense based on patient and insurance preference. Check sugar daily  ? cholecalciferol (VITAMIN D) 400 units TABS tablet Take 400 Units by mouth.  ? CINNAMON PO Take 2 capsules by mouth daily.  ? ibuprofen (ADVIL) 600 MG tablet Take 1 tablet (600 mg  total) by mouth every 6 (six) hours as needed.  ? metFORMIN (GLUCOPHAGE) 1000 MG tablet TAKE 1 TABLET BY MOUTH 2 TIMES DAILY WITH A MEAL  ? methocarbamol (ROBAXIN) 500 MG tablet Take 1 tablet (500 mg total) by mouth 2 (two) times daily.  ? metoprolol succinate (TOPROL-XL) 50 MG 24 hr tablet TAKE 1 TABLET BY MOUTH ONCE DAILY WITH  OR  IMMEDIATELY  FOLLOWING  A  MEAL  ? mirtazapine (REMERON) 30 MG tablet TAKE 1 TABLET BY MOUTH AT BEDTIME  ? Omega-3 Fatty Acids (OMEGA 3 500 PO) Take by mouth.  ? omeprazole (PRILOSEC) 40 MG capsule TAKE 1 CAPSULE BY MOUTH DAILY AS NEEDED FOR HEARTBURN  ? ONE TOUCH ULTRA TEST test strip CHECK BLOOD SUGAR DAILY  ? ONETOUCH DELICA LANCETS 17P MISC TEST BLOOD SUGAR DAILY  ? traZODone (DESYREL) 50 MG tablet TAKE 1/2 TO 1 TABLET BY MOUTH AT BEDTIME AS NEEDED FOR SLEEP  ? valACYclovir (VALTREX) 1000  MG tablet TAKE 1 TABLET(1000 MG) BY MOUTH DAILY  ? vitamin B-12 (CYANOCOBALAMIN) 1000 MCG tablet Take 1,000 mcg by mouth daily.  ? vitamin E 400 UNIT capsule Take 400 Units by mouth 2 (two) times daily.  ? ?No facility-administered medications prior to visit.  ?  ?Allergies  ?Allergen Reactions  ? Bupropion   ?  insomnia  ? Sertraline   ?  Ejaculatory dysfunction  ? Penicillins Rash  ? ? ?Patient Care Team: ?Virginia Crews, MD as PCP - General (Family Medicine) ?[facility], [place], [place] ? ?Review of Systems  ?Constitutional:  Negative for appetite change, chills, fatigue and fever.  ?HENT:  Negative for congestion, ear pain, hearing loss, nosebleeds and trouble swallowing.   ?Eyes:  Negative for pain and visual disturbance.  ?Respiratory:  Negative for cough, chest tightness and shortness of breath.   ?Cardiovascular:  Negative for chest pain, palpitations and leg swelling.  ?Gastrointestinal:  Negative for abdominal pain, blood in stool, constipation, diarrhea, nausea and vomiting.  ?Endocrine: Negative for polydipsia, polyphagia and polyuria.  ?Genitourinary:  Negative for  dysuria and flank pain.  ?Musculoskeletal:  Positive for myalgias. Negative for arthralgias, back pain, joint swelling and neck stiffness.  ?Skin:  Negative for color change, rash and wound.  ?Neurological:  P

## 2021-08-04 LAB — MICROALBUMIN / CREATININE URINE RATIO
Creatinine, Urine: 189.7 mg/dL
Microalb/Creat Ratio: 4 mg/g creat (ref 0–29)
Microalbumin, Urine: 8.5 ug/mL

## 2021-08-13 NOTE — Progress Notes (Signed)
? ? ? ?Annual Wellness Visit ? ?  ? ?Patient: Billy Hardin, Male    DOB: [birth year], 71 y.o.   MRN: [id number] ?Visit Date: 08/03/2021 ? ?Today's Provider: Lelon Huh, MD  ? ? ?Subjective  ?  ?Billy Hardin is a 71 y.o. male who presents today for his Annual Wellness Visit. ? ? ?Medications: ?Outpatient Medications Prior to Visit  ?Medication Sig  ? atorvastatin (LIPITOR) 20 MG tablet Take 1 tablet by mouth once daily  ? blood glucose meter kit and supplies KIT Dispense based on patient and insurance preference. Check sugar daily  ? cholecalciferol (VITAMIN D) 400 units TABS tablet Take 400 Units by mouth.  ? CINNAMON PO Take 2 capsules by mouth daily.  ? ibuprofen (ADVIL) 600 MG tablet Take 1 tablet (600 mg total) by mouth every 6 (six) hours as needed.  ? metFORMIN (GLUCOPHAGE) 1000 MG tablet TAKE 1 TABLET BY MOUTH 2 TIMES DAILY WITH A MEAL  ? methocarbamol (ROBAXIN) 500 MG tablet Take 1 tablet (500 mg total) by mouth 2 (two) times daily.  ? metoprolol succinate (TOPROL-XL) 50 MG 24 hr tablet TAKE 1 TABLET BY MOUTH ONCE DAILY WITH  OR  IMMEDIATELY  FOLLOWING  A  MEAL  ? mirtazapine (REMERON) 30 MG tablet TAKE 1 TABLET BY MOUTH AT BEDTIME  ? Omega-3 Fatty Acids (OMEGA 3 500 PO) Take by mouth.  ? omeprazole (PRILOSEC) 40 MG capsule TAKE 1 CAPSULE BY MOUTH DAILY AS NEEDED FOR HEARTBURN  ? ONE TOUCH ULTRA TEST test strip CHECK BLOOD SUGAR DAILY  ? ONETOUCH DELICA LANCETS 32G MISC TEST BLOOD SUGAR DAILY  ? traZODone (DESYREL) 50 MG tablet TAKE 1/2 TO 1 TABLET BY MOUTH AT BEDTIME AS NEEDED FOR SLEEP  ? valACYclovir (VALTREX) 1000 MG tablet TAKE 1 TABLET(1000 MG) BY MOUTH DAILY  ? vitamin B-12 (CYANOCOBALAMIN) 1000 MCG tablet Take 1,000 mcg by mouth daily.  ? vitamin E 400 UNIT capsule Take 400 Units by mouth 2 (two) times daily.  ? ?No facility-administered medications prior to visit.  ?  ?Allergies  ?Allergen Reactions  ? Bupropion   ?  insomnia  ? Sertraline   ?  Ejaculatory dysfunction  ? Penicillins Rash   ? ? ?Patient Care Team: ?Virginia Crews, MD as PCP - General (Family Medicine) ?[facility], [place], [place] ? ? ? Objective  ?  ? ?Most recent functional status assessment: ? ?  07/17/2021  ?  4:00 PM  ?In your present state of health, do you have any difficulty performing the following activities:  ?Hearing? 1  ?Vision? 0  ?Difficulty concentrating or making decisions? 0  ?Walking or climbing stairs? 0  ?Dressing or bathing? 0  ?Doing errands, shopping? 0  ?Preparing Food and eating ? N  ?Using the Toilet? N  ?In the past six months, have you accidently leaked urine? N  ?Do you have problems with loss of bowel control? N  ?Managing your Medications? N  ?Managing your Finances? N  ?Housekeeping or managing your Housekeeping? N  ? ?Most recent fall risk assessment: ? ?  07/17/2021  ?  4:00 PM  ?Fall Risk   ?Falls in the past year? 0  ?Injury with Fall? 0  ? ? Most recent depression screenings: ? ?  03/07/2021  ?  8:43 AM 10/29/2020  ?  8:48 AM  ?PHQ 2/9 Scores  ?PHQ - 2 Score 0 0  ?PHQ- 9 Score 1 0  ? ?Most recent cognitive screening: ?   ?  View : No data to display.  ?  ?  ?  ? ?Most recent Audit-C alcohol use screening ? ?  07/17/2021  ?  4:00 PM  ?Alcohol Use Disorder Test (AUDIT)  ?1. How often do you have a drink containing alcohol? 1  ?2. How many drinks containing alcohol do you have on a typical day when you are drinking? 0  ?3. How often do you have six or more drinks on one occasion? 0  ?AUDIT-C Score 1  ? ?A score of 3 or more in women, and 4 or more in men indicates increased risk for alcohol abuse, EXCEPT if all of the points are from question 1  ? ?Results for orders placed or performed in visit on 08/03/21  ?Urine Albumin-Creatinine with uACR  ?Result Value Ref Range  ? Creatinine, Urine 189.7 Not Estab. mg/dL  ? Microalbumin, Urine 8.5 Not Estab. ug/mL  ? Microalb/Creat Ratio 4 0 - 29 mg/g creat  ? ? Assessment & Plan  ?  ? ?Annual wellness visit done today including the all of the  following: ?Reviewed patient's Family Medical History ?Reviewed and updated list of patient's medical providers ?Assessment of cognitive impairment was done ?Assessed patient's functional ability ?Established a written schedule for health screening services ?Health Risk Assessent Completed and Reviewed ? ?Exercise Activities and Dietary recommendations ? Goals   ?None ?  ? ? ?Immunization History  ?Administered Date(s) Administered  ? Fluad Quad(high Dose 65+) 02/05/2019, 01/29/2020, 03/07/2021  ? Influenza-Unspecified 02/13/2015, 04/01/2016, 03/16/2017  ? PFIZER(Purple Top)SARS-COV-2 Vaccination 07/30/2019, 08/20/2019, 05/31/2020  ? Pneumococcal Conjugate-13 05/30/2016  ? Pneumococcal Polysaccharide-23 05/26/2015  ? Td 06/04/2017  ? Tdap 01/16/2007, 05/28/2020  ? Zoster, Live 06/04/2012  ? ? ?Health Maintenance  ?Topic Date Due  ? Hepatitis C Screening  Never done  ? Zoster Vaccines- Shingrix (1 of 2) Never done  ? Pneumonia Vaccine 67+ Years old (79) 05/25/2020  ? COVID-19 Vaccine (4 - Booster for Pfizer series) 07/26/2020  ? OPHTHALMOLOGY EXAM  02/09/2021  ? FOOT EXAM  07/27/2021  ? INFLUENZA VACCINE  12/13/2021  ? HEMOGLOBIN A1C  01/29/2022  ? URINE MICROALBUMIN  08/04/2022  ? COLONOSCOPY (Pts 45-49yr Insurance coverage will need to be confirmed)  09/29/2026  ? TETANUS/TDAP  05/28/2030  ? HPV VACCINES  Aged Out  ? ? ? ?Discussed health benefits of physical activity, and encouraged him to engage in regular exercise appropriate for his age and condition.  ? ?  ?  ? ? ? ? ?DLelon Huh MD  ?[facility]?[phone](phone) ?[phone](fax) ? ?[facility]  ? ?

## 2021-08-15 ENCOUNTER — Other Ambulatory Visit: Payer: Self-pay | Admitting: Family Medicine

## 2021-08-15 DIAGNOSIS — E119 Type 2 diabetes mellitus without complications: Secondary | ICD-10-CM

## 2021-08-16 NOTE — Telephone Encounter (Signed)
Requested Prescriptions  ?Pending Prescriptions Disp Refills  ?? metFORMIN (GLUCOPHAGE) 1000 MG tablet [Pharmacy Med Name: metFORMIN HCl 1000 MG Oral Tablet] 180 tablet 0  ?  Sig: TAKE 1 TABLET BY MOUTH TWICE DAILY WITH A MEAL  ?  ? Endocrinology:  Diabetes - Biguanides Failed - 08/15/2021 12:23 PM  ?  ?  Failed - B12 Level in normal range and within 720 days  ?  Vitamin B-12  ?Date Value Ref Range Status  ?06/04/2017 718 232 - 1,245 pg/mL Final  ?   ?  ?  Failed - CBC within normal limits and completed in the last 12 months  ?  WBC  ?Date Value Ref Range Status  ?07/29/2021 5.9 3.4 - 10.8 x10E3/uL Final  ?05/13/2020 5.1 4.0 - 10.5 K/uL Final  ? ?RBC  ?Date Value Ref Range Status  ?07/29/2021 4.15 4.14 - 5.80 x10E6/uL Final  ?05/13/2020 4.29 4.22 - 5.81 MIL/uL Final  ? ?Hemoglobin  ?Date Value Ref Range Status  ?07/29/2021 13.2 13.0 - 17.7 g/dL Final  ? ?Hematocrit  ?Date Value Ref Range Status  ?07/29/2021 38.8 37.5 - 51.0 % Final  ? ?MCHC  ?Date Value Ref Range Status  ?07/29/2021 34.0 31.5 - 35.7 g/dL Final  ?05/13/2020 33.5 30.0 - 36.0 g/dL Final  ? ?MCH  ?Date Value Ref Range Status  ?07/29/2021 31.8 26.6 - 33.0 pg Final  ?05/13/2020 32.2 26.0 - 34.0 pg Final  ? ?MCV  ?Date Value Ref Range Status  ?07/29/2021 94 79 - 97 fL Final  ?05/27/2014 95 80 - 100 fL Final  ? ?No results found for: PLTCOUNTKUC, LABPLAT, [place] ?RDW  ?Date Value Ref Range Status  ?07/29/2021 12.7 11.6 - 15.4 % Final  ?05/27/2014 12.7 11.5 - 14.5 % Final  ? ?  ?  ?  Passed - Cr in normal range and within 360 days  ?  Creat  ?Date Value Ref Range Status  ?02/06/2017 0.95 0.70 - 1.25 mg/dL Final  ?  Comment:  ?  For patients >49 years of age, the reference limit ?for Creatinine is approximately 13% higher for people ?identified as African-American. ?. ?  ? ?Creatinine, Ser  ?Date Value Ref Range Status  ?07/29/2021 1.09 0.76 - 1.27 mg/dL Final  ?   ?  ?  Passed - HBA1C is between 0 and 7.9 and within 180 days  ?  Hemoglobin A1C  ?Date Value Ref  Range Status  ?11/04/2012 6.8 (H) 4.2 - 6.3 % Final  ?  Comment:  ?  The American Diabetes Association recommends that a primary goal of ?therapy should be <7% and that physicians should reevaluate the ?treatment regimen in patients with HbA1c values consistently >8%. ?  ? ?Hgb A1c MFr Bld  ?Date Value Ref Range Status  ?07/29/2021 6.8 (H) 4.8 - 5.6 % Final  ?  Comment:  ?           Prediabetes: 5.7 - 6.4 ?         Diabetes: >6.4 ?         Glycemic control for adults with diabetes: <7.0 ?  ?   ?  ?  Passed - eGFR in normal range and within 360 days  ?  GFR, Est African American  ?Date Value Ref Range Status  ?02/06/2017 97 > OR = 60 mL/min/1.58m Final  ? ?GFR calc Af Amer  ?Date Value Ref Range Status  ?07/29/2019 71 >59 mL/min/1.73 Final  ? ?GFR, Est Non African American  ?Date Value  Ref Range Status  ?02/06/2017 84 > OR = 60 mL/min/1.15m Final  ? ?GFR, Estimated  ?Date Value Ref Range Status  ?05/13/2020 >60 >60 mL/min Final  ?  Comment:  ?  (NOTE) ?Calculated using the CKD-EPI Creatinine Equation (2021) ?  ? ?eGFR  ?Date Value Ref Range Status  ?07/29/2021 73 >59 mL/min/1.73 Final  ?   ?  ?  Passed - Valid encounter within last 6 months  ?  Recent Outpatient Visits   ?      ? 1 week ago Type 2 diabetes mellitus without complication, without long-term current use of insulin ([place]  ? [facility]  ? 3 weeks ago   ? [facility]  ? 5 months ago Type 2 diabetes mellitus without complication, without long-term current use of insulin ([place]  ? [facility]  ? 9 months ago Type 2 diabetes mellitus without complication, without long-term current use of insulin ([place]  ? [facility] Per[facility]  ? 1 year ago Visit for suture removal  ? [facility] [place] ?  ?  ?Future Appointments   ?        ? In 4 months Fisher, DKirstie Peri MD [facility]  ?   ? ?  ?  ?  ? ?

## 2021-08-20 NOTE — Progress Notes (Signed)
Appt cancelled. Not  seen ?

## 2021-08-29 ENCOUNTER — Other Ambulatory Visit: Payer: Self-pay | Admitting: Family Medicine

## 2021-08-29 DIAGNOSIS — G43709 Chronic migraine without aura, not intractable, without status migrainosus: Secondary | ICD-10-CM

## 2021-08-29 NOTE — Telephone Encounter (Signed)
Requested Prescriptions  ?Pending Prescriptions Disp Refills  ?? metoprolol succinate (TOPROL-XL) 50 MG 24 hr tablet [Pharmacy Med Name: Metoprolol Succinate ER 50 MG Oral Tablet Extended Release 24 Hour] 90 tablet 1  ?  Sig: TAKE 1 TABLET BY MOUTH ONCE DAILY WITH  OR  IMMEDIATELY  FOLLOWING  A  MEAL  ?  ? Cardiovascular:  Beta Blockers Passed - 08/29/2021 10:06 AM  ?  ?  Passed - Last BP in normal range  ?  BP Readings from Last 1 Encounters:  ?08/03/21 (!) 128/58  ?   ?  ?  Passed - Last Heart Rate in normal range  ?  Pulse Readings from Last 1 Encounters:  ?08/03/21 84  ?   ?  ?  Passed - Valid encounter within last 6 months  ?  Recent Outpatient Visits   ?      ? 3 weeks ago Type 2 diabetes mellitus without complication, without long-term current use of insulin ([place])  ? [facility] Caryn Section, Kirstie Peri, MD  ? 1 month ago No-show for appointment  ? [facility] Birdie Sons, MD  ? 5 months ago Type 2 diabetes mellitus without complication, without long-term current use of insulin ([place])  ? [facility] Birdie Sons, MD  ? 10 months ago Type 2 diabetes mellitus without complication, without long-term current use of insulin ([place])  ? [facility] Caryn Section, Kirstie Peri, MD  ? 1 year ago Visit for suture removal  ? [facility] Carles Collet M, [place]  ?  ?  ?Future Appointments   ?        ? In 4 months Fisher, Kirstie Peri, MD [facility], [facility]  ?  ? ?  ?  ?  ? ?

## 2021-09-05 ENCOUNTER — Other Ambulatory Visit: Payer: Self-pay | Admitting: Family Medicine

## 2021-09-05 DIAGNOSIS — F3289 Other specified depressive episodes: Secondary | ICD-10-CM

## 2021-09-05 DIAGNOSIS — G4700 Insomnia, unspecified: Secondary | ICD-10-CM

## 2021-10-31 ENCOUNTER — Other Ambulatory Visit: Payer: Self-pay | Admitting: Family Medicine

## 2021-10-31 DIAGNOSIS — R12 Heartburn: Secondary | ICD-10-CM

## 2021-11-10 ENCOUNTER — Ambulatory Visit: Payer: Self-pay

## 2021-11-10 ENCOUNTER — Encounter: Payer: Self-pay | Admitting: Physician Assistant

## 2021-11-10 ENCOUNTER — Ambulatory Visit (INDEPENDENT_AMBULATORY_CARE_PROVIDER_SITE_OTHER): Payer: Medicare HMO | Admitting: Physician Assistant

## 2021-11-10 VITALS — BP 135/72 | HR 79 | Temp 98.2°F | Resp 16 | Ht 72.0 in | Wt 182.3 lb

## 2021-11-10 DIAGNOSIS — M25551 Pain in right hip: Secondary | ICD-10-CM | POA: Diagnosis not present

## 2021-11-10 DIAGNOSIS — G8929 Other chronic pain: Secondary | ICD-10-CM | POA: Diagnosis not present

## 2021-11-10 NOTE — Telephone Encounter (Signed)
Summary: mild in in right side of buttocks   Patient called in states has mild pain on the right side of his buttocks. No appt is available until October. Please call back for further assistance. Pharmacy is [facility] [address], [place]  Phone: [phone]  Fax: [phone]      Chief Complaint: right hip pain x 2 months Symptoms: mild to moderate hip pain, occasional pain that shoots down right leg  Frequency: 2 months Pertinent Negatives: Patient denies back pain, fever, rash, leg weakness Disposition: '[]'$ ED /'[]'$ Urgent Care (no appt availability in office) / '[x]'$ Appointment(In office/virtual)/ '[]'$  [place] [facility]/ '[]'$ Home Care/ '[]'$ Refused Recommended Disposition /'[]'$ [place] [facility]/ '[]'$  Follow-up with PCP Additional Notes: Appt scheduled for today at 1540. Of note: pt anxious. Pt stated his father had similar sx and he was dx with cancer. Reassurance given that he will gt the help to dx what is happening with hip.  Reason for Disposition  Hip pain is a chronic symptom (recurrent or ongoing AND present > 4 weeks)  Answer Assessment - Initial Assessment Questions 1. LOCATION and RADIATION: "Where is the pain located?"      Right hip 2. QUALITY: "What does the pain feel like?"  (e.g., sharp, dull, aching, burning)     Unable to qualifying 3. SEVERITY: "How bad is the pain?" "What does it keep you from doing?"   (Scale 1-10; or mild, moderate, severe)   -  MILD (1-3): doesn't interfere with normal activities    -  MODERATE (4-7): interferes with normal activities (e.g., work or school) or awakens from sleep, limping    -  SEVERE (8-10): excruciating pain, unable to do any normal activities, unable to walk     Mild to moderate-worsens with increased activity 4. ONSET: "When did the pain start?" "Does it come and go, or is it there all the time?"     2 months ago- constant 5. WORK OR EXERCISE: "Has there been any recent work or exercise that involved  this part of the body?"      no 6. CAUSE: "What do you think is causing the hip pain?"     Arthritis., pinched nerve, father dies of cancer with similar pain 7. AGGRAVATING FACTORS: "What makes the hip pain worse?" (e.g., walking, climbing stairs, running)     Increased activity, turning wrong 8. OTHER SYMPTOMS: "Do you have any other symptoms?" (e.g., back pain, pain shooting down leg,  fever, rash)     Pain shooting down right leg  Protocols used: Hip Pain-A-AH

## 2021-11-10 NOTE — Progress Notes (Signed)
I,Billy Hardin,acting as a Education administrator for [employer], PA-C.,have documented all relevant documentation on the behalf of Billy Speak, PA-C,as directed by  [employer], PA-C while in the presence of [employer], PA-C.   Established patient visit   Patient: Billy Hardin   DOB: May 08, 1951   71 y.o. Male  MRN: [id number] Visit Date: 11/10/2021  Today's healthcare provider: [name], PA-C   Chief Complaint  Patient presents with   Hip Pain   Subjective    Patient is a 71 yr old male presenting for right hip pain described as dull but persistent. Pain is off and on throughout the day that occasional shoots down right leg. Patient points to right buttocks.  Onset 2 months ago. No known injury.  Taking nothing for it. States his father had similar symptoms with his hip and was diagnosed with cancer and this makes patient anxious.  Patient does not know what type of cancer, but states it started in his hip.    Medications: Outpatient Medications Prior to Visit  Medication Sig   atorvastatin (LIPITOR) 20 MG tablet Take 1 tablet by mouth once daily   blood glucose meter kit and supplies KIT Dispense based on patient and insurance preference. Check sugar daily   cholecalciferol (VITAMIN D) 400 units TABS tablet Take 400 Units by mouth.   CINNAMON PO Take 2 capsules by mouth daily.   ibuprofen (ADVIL) 600 MG tablet Take 1 tablet (600 mg total) by mouth every 6 (six) hours as needed.   metFORMIN (GLUCOPHAGE) 1000 MG tablet TAKE 1 TABLET BY MOUTH TWICE DAILY WITH A MEAL   methocarbamol (ROBAXIN) 500 MG tablet Take 1 tablet (500 mg total) by mouth 2 (two) times daily.   metoprolol succinate (TOPROL-XL) 50 MG 24 hr tablet TAKE 1 TABLET BY MOUTH ONCE DAILY WITH  OR  IMMEDIATELY  FOLLOWING  A  MEAL   mirtazapine (REMERON) 30 MG tablet TAKE 1 TABLET BY MOUTH AT BEDTIME   Omega-3 Fatty Acids (OMEGA 3 500 PO) Take by mouth.   omeprazole (PRILOSEC) 40 MG capsule TAKE 1 CAPSULE BY MOUTH  ONCE DAILY AS NEEDED FOR  HEARTBURN   ONE TOUCH ULTRA TEST test strip CHECK BLOOD SUGAR DAILY   ONETOUCH DELICA LANCETS 08M MISC TEST BLOOD SUGAR DAILY   traZODone (DESYREL) 50 MG tablet TAKE 1/2 TO 1 (ONE-HALF TO ONE) TABLET BY MOUTH AT BEDTIME AS NEEDED FOR SLEEP   valACYclovir (VALTREX) 1000 MG tablet TAKE 1 TABLET(1000 MG) BY MOUTH DAILY   vitamin B-12 (CYANOCOBALAMIN) 1000 MCG tablet Take 1,000 mcg by mouth daily.   vitamin E 400 UNIT capsule Take 400 Units by mouth 2 (two) times daily.   No facility-administered medications prior to visit.    Review of Systems  Musculoskeletal:  Positive for back pain.  All other systems reviewed and are negative. See HPI     Objective    BP 135/72 (BP Location: Left Arm, Patient Position: Sitting, Cuff Size: Normal)   Pulse 79   Temp 98.2 F (36.8 C) (Oral)   Resp 16   Ht 6' (1.829 m)   Wt 182 lb 4.8 oz (82.7 kg)   SpO2 98%   BMI 24.72 kg/m    Physical Exam Vitals reviewed.  Constitutional:      Appearance: Normal appearance. He is normal weight.  HENT:     Head: Normocephalic and atraumatic.     Nose: Nose normal.  Eyes:     Extraocular Movements: Extraocular  movements intact.     Pupils: Pupils are equal, round, and reactive to light.  Cardiovascular:     Rate and Rhythm: Normal rate and regular rhythm.     Pulses: Normal pulses.     Heart sounds: Normal heart sounds.  Pulmonary:     Effort: Pulmonary effort is normal.     Breath sounds: Normal breath sounds.  Abdominal:     General: Abdomen is flat. Bowel sounds are normal. There is no distension.     Palpations: Abdomen is soft.     Tenderness: There is no right CVA tenderness or left CVA tenderness.  Musculoskeletal:        General: Normal range of motion.     Cervical back: Normal range of motion.     Comments: Negative straight raise leg test/ crossover Negative Corky Sox, Fadir hip tests  Neurological:     General: No focal deficit present.     Mental Status: He  is alert and oriented to person, place, and time. Mental status is at baseline.     Sensory: No sensory deficit.     Motor: No weakness.     Coordination: Coordination normal.     Gait: Gait normal.     Deep Tendon Reflexes: Reflexes normal.  Psychiatric:        Behavior: Behavior normal.        Thought Content: Thought content normal.        Judgment: Judgment normal.     No results found for any visits on 11/10/21.  Assessment & Plan     1. Hip pain, chronic, right Pain x > 2 mo Hx of bilateral hip OA per XR in 2020 Requested XR to rule out cancer His father died from bone cancer - DG HIP UNILAT W OR W/O PELVIS 2-3 VIEWS LEFT; Future  FU in 2 weeks The patient was advised to call back or seek an in-person evaluation if the symptoms worsen or if the condition fails to improve as anticipated.  I discussed the assessment and treatment plan with the patient. The patient was provided an opportunity to ask questions and all were answered. The patient agreed with the plan and demonstrated an understanding of the instructions.  The entirety of the information documented in the History of Present Illness, Review of Systems and Physical Exam were personally obtained by me. Portions of this information were initially documented by the CMA and reviewed by me for thoroughness and accuracy.  Portions of this note were created using dictation software and may contain typographical errors.    Billy Speak, PA-C  [facility] [phone] (phone) [phone] (fax)  [place]

## 2021-11-11 ENCOUNTER — Ambulatory Visit
Admission: RE | Admit: 2021-11-11 | Discharge: 2021-11-11 | Disposition: A | Discharge status: Home / Self Care | Payer: Medicare HMO | Source: Ambulatory Visit | Attending: Family Medicine | Admitting: Family Medicine

## 2021-11-11 ENCOUNTER — Ambulatory Visit
Admission: RE | Admit: 2021-11-11 | Discharge: 2021-11-11 | Disposition: A | Discharge status: Home / Self Care | Payer: Medicare HMO | Source: Ambulatory Visit | Attending: Physician Assistant | Admitting: Physician Assistant

## 2021-11-11 ENCOUNTER — Other Ambulatory Visit: Payer: Self-pay | Admitting: Physician Assistant

## 2021-11-11 DIAGNOSIS — M25551 Pain in right hip: Secondary | ICD-10-CM | POA: Diagnosis not present

## 2021-11-11 DIAGNOSIS — G8929 Other chronic pain: Secondary | ICD-10-CM | POA: Insufficient documentation

## 2021-11-16 NOTE — Progress Notes (Unsigned)
I,Jana John Vasconcelos,acting as a Education administrator for [employer], PA-C.,have documented all relevant documentation on the behalf of [name], PA-C,as directed by  [employer], PA-C while in the presence of [employer], PA-C.   Established patient visit   Patient: Billy Hardin   DOB: July 24, 1950   71 y.o. Male  MRN: [id number] Visit Date: 11/17/2021  Today's healthcare provider: [name], PA-C   CC: 1 wk f/u hip pain Subjective    Follow up for right hip pain  The patient was last seen for this 1 weeks ago. Changes made at last visit include DG HIP UNILAT W OR W/O PELVIS 2-3 VIEWS LEFT   Findings-There is no evidence of hip fracture or dislocation. Stable benign bone islands are seen within the right acetabulum. Stable degenerative changes are seen in the form of joint space narrowing and acetabular sclerosis.  IMPRESSION: Stable degenerative changes in the right hip. He feels that condition is Worse. Still taking nothing for the pain.   -----------------------------------------------------------------------------------------   Medications: Outpatient Medications Prior to Visit  Medication Sig   atorvastatin (LIPITOR) 20 MG tablet Take 1 tablet by mouth once daily   blood glucose meter kit and supplies KIT Dispense based on patient and insurance preference. Check sugar daily   cholecalciferol (VITAMIN D) 400 units TABS tablet Take 400 Units by mouth.   CINNAMON PO Take 2 capsules by mouth daily.   ibuprofen (ADVIL) 600 MG tablet Take 1 tablet (600 mg total) by mouth every 6 (six) hours as needed.   metFORMIN (GLUCOPHAGE) 1000 MG tablet TAKE 1 TABLET BY MOUTH TWICE DAILY WITH A MEAL   methocarbamol (ROBAXIN) 500 MG tablet Take 1 tablet (500 mg total) by mouth 2 (two) times daily.   metoprolol succinate (TOPROL-XL) 50 MG 24 hr tablet TAKE 1 TABLET BY MOUTH ONCE DAILY WITH  OR  IMMEDIATELY  FOLLOWING  A  MEAL   mirtazapine (REMERON) 30 MG tablet TAKE 1 TABLET BY MOUTH  AT BEDTIME   Omega-3 Fatty Acids (OMEGA 3 500 PO) Take by mouth.   omeprazole (PRILOSEC) 40 MG capsule TAKE 1 CAPSULE BY MOUTH ONCE DAILY AS NEEDED FOR  HEARTBURN   ONE TOUCH ULTRA TEST test strip CHECK BLOOD SUGAR DAILY   ONETOUCH DELICA LANCETS 38B MISC TEST BLOOD SUGAR DAILY   traZODone (DESYREL) 50 MG tablet TAKE 1/2 TO 1 (ONE-HALF TO ONE) TABLET BY MOUTH AT BEDTIME AS NEEDED FOR SLEEP   valACYclovir (VALTREX) 1000 MG tablet TAKE 1 TABLET(1000 MG) BY MOUTH DAILY   vitamin B-12 (CYANOCOBALAMIN) 1000 MCG tablet Take 1,000 mcg by mouth daily.   vitamin E 400 UNIT capsule Take 400 Units by mouth 2 (two) times daily.   No facility-administered medications prior to visit.    Review of Systems  All other systems reviewed and are negative. Except HPI     Objective    BP 128/77 (BP Location: Left Arm, Patient Position: Sitting, Cuff Size: Normal)   Pulse 82   Temp 98 F (36.7 C) (Oral)   Resp 16   Wt 179 lb (81.2 kg)   SpO2 100%   BMI 24.28 kg/m    Physical Exam Vitals reviewed.  Constitutional:      Appearance: Normal appearance.  HENT:     Head: Normocephalic and atraumatic.     Nose: Nose normal.  Eyes:     Extraocular Movements: Extraocular movements intact.  Pulmonary:     Effort: Pulmonary effort is normal.  Abdominal:  General: Abdomen is flat.     Palpations: Abdomen is soft.  Musculoskeletal:        General: Normal range of motion.     Right lower leg: No edema.     Left lower leg: No edema.  Neurological:     General: No focal deficit present.     Mental Status: He is alert and oriented to person, place, and time.  Psychiatric:        Behavior: Behavior normal.        Thought Content: Thought content normal.        Judgment: Judgment normal.     No results found for any visits on 11/17/21.  Assessment & Plan     1. Hip pain, chronic, right XR hip from 11/11/21 showed "Stable degenerative changes in the right hip." Advised to see PT  Pt will  need to use OTC pain medication for pain control, heat/cold therapy Recommended to stay active, do stretching and low-impact exercise/walking/swimming Suggested to consider steroid injections. Referrals could be placed to Ortho or pain clinic  Pt will be going to [place] next week. He will contact us when he will be back      The patient was advised to call back or seek an in-person evaluation if the symptoms worsen or if the condition fails to improve as anticipated.  I discussed the assessment and treatment plan with the patient. The patient was provided an opportunity to ask questions and all were answered. The patient agreed with the plan and demonstrated an understanding of the instructions.  The entirety of the information documented in the History of Present Illness, Review of Systems and Physical Exam were personally obtained by me. Portions of this information were initially documented by the CMA and reviewed by me for thoroughness and accuracy.  Portions of this note were created using dictation software and may contain typographical errors.     [name], PA-C  [facility] [phone] (phone) [phone] (fax)  [place]

## 2021-11-17 ENCOUNTER — Ambulatory Visit (INDEPENDENT_AMBULATORY_CARE_PROVIDER_SITE_OTHER): Payer: Medicare HMO | Admitting: Physician Assistant

## 2021-11-17 ENCOUNTER — Encounter: Payer: Self-pay | Admitting: Physician Assistant

## 2021-11-17 VITALS — BP 128/77 | HR 82 | Temp 98.0°F | Resp 16 | Wt 179.0 lb

## 2021-11-17 DIAGNOSIS — M25551 Pain in right hip: Secondary | ICD-10-CM | POA: Diagnosis not present

## 2021-11-17 DIAGNOSIS — G8929 Other chronic pain: Secondary | ICD-10-CM

## 2021-11-21 ENCOUNTER — Other Ambulatory Visit: Payer: Self-pay | Admitting: Family Medicine

## 2021-11-21 DIAGNOSIS — K130 Diseases of lips: Secondary | ICD-10-CM

## 2021-11-21 DIAGNOSIS — E119 Type 2 diabetes mellitus without complications: Secondary | ICD-10-CM

## 2022-01-03 NOTE — Progress Notes (Unsigned)
I,Roshena L Chambers,acting as a scribe for Lelon Huh, MD.,have documented all relevant documentation on the behalf of Lelon Huh, MD,as directed by  Lelon Huh, MD while in the presence of Lelon Huh, MD.    Established patient visit   Patient: Billy Hardin   DOB: 20-Feb-1951   70 y.o. Male  MRN: [id number] Visit Date: 01/04/2022  Today's healthcare provider: Lelon Huh, MD   Chief Complaint  Patient presents with   Diabetes   Hypertension   Subjective    HPI  Diabetes Mellitus Type II, Follow-up  Lab Results  Component Value Date   HGBA1C 7.0 (A) 01/04/2022   HGBA1C 6.8 (H) 07/29/2021   HGBA1C 7.1 (A) 03/07/2021   Wt Readings from Last 3 Encounters:  01/04/22 176 lb (79.8 kg)  11/17/21 179 lb (81.2 kg)  11/10/21 182 lb 4.8 oz (82.7 kg)   Last seen for diabetes 5 months ago.  Management since then includes continue same medication. He reports good compliance with treatment. He is not having side effects.  Symptoms: No fatigue No foot ulcerations  No appetite changes No nausea  No paresthesia of the feet  No polydipsia  No polyuria No visual disturbances   No vomiting     Home blood sugar records:  blood sugars are not checked  Episodes of hypoglycemia? No    Current insulin regiment: none Most Recent Eye Exam: not UTD Current exercise: none Current diet habits: in general, an "unhealthy" diet  Pertinent Labs: Lab Results  Component Value Date   CHOL 110 07/29/2021   HDL 50 07/29/2021   LDLCALC 43 07/29/2021   TRIG 89 07/29/2021   CHOLHDL 2.2 07/29/2021   Lab Results  Component Value Date   NA 143 07/29/2021   K 4.2 07/29/2021   CREATININE 1.09 07/29/2021   EGFR 73 07/29/2021   LABMICR 8.5 08/03/2021     ---------------------------------------------------------------------------------------------------   Hypertension, follow-up  BP Readings from Last 3 Encounters:  01/04/22 120/68  11/17/21 128/77  11/10/21 135/72    Wt Readings from Last 3 Encounters:  01/04/22 176 lb (79.8 kg)  11/17/21 179 lb (81.2 kg)  11/10/21 182 lb 4.8 oz (82.7 kg)     He was last seen for hypertension 5 months ago.  BP at that visit was 128/58. Management since that visit includes continuing same medication.  He reports good compliance with treatment. He is not having side effects.  He is following a Regular diet. He is not exercising. He does not smoke.  Use of agents associated with hypertension: none.   Outside blood pressures are not checked. Symptoms: No chest pain No chest pressure  No palpitations No syncope  No dyspnea No orthopnea  No paroxysmal nocturnal dyspnea No lower extremity edema   His main complaint today is right hip and knee pain for several months. Has been to orthopedics and he state they recommend knee replacement which he is not interested in.  He also requests referral to dermatology for skin evaluation. He has several moles on chest that he would like to be checked.  ---------------------------------------------------------------------------------------------------   Medications: Outpatient Medications Prior to Visit  Medication Sig   atorvastatin (LIPITOR) 20 MG tablet Take 1 tablet by mouth once daily   blood glucose meter kit and supplies KIT Dispense based on patient and insurance preference. Check sugar daily   cholecalciferol (VITAMIN D) 400 units TABS tablet Take 400 Units by mouth.   CINNAMON PO Take 2 capsules by mouth  daily.   metFORMIN (GLUCOPHAGE) 1000 MG tablet TAKE 1 TABLET BY MOUTH TWICE DAILY WITH A MEAL   methocarbamol (ROBAXIN) 500 MG tablet Take 1 tablet (500 mg total) by mouth 2 (two) times daily.   metoprolol succinate (TOPROL-XL) 50 MG 24 hr tablet TAKE 1 TABLET BY MOUTH ONCE DAILY WITH  OR  IMMEDIATELY  FOLLOWING  A  MEAL   mirtazapine (REMERON) 30 MG tablet TAKE 1 TABLET BY MOUTH AT BEDTIME   Omega-3 Fatty Acids (OMEGA 3 500 PO) Take by mouth.   omeprazole  (PRILOSEC) 40 MG capsule TAKE 1 CAPSULE BY MOUTH ONCE DAILY AS NEEDED FOR  HEARTBURN   ONE TOUCH ULTRA TEST test strip CHECK BLOOD SUGAR DAILY   ONETOUCH DELICA LANCETS 93T MISC TEST BLOOD SUGAR DAILY   traZODone (DESYREL) 50 MG tablet TAKE 1/2 TO 1 (ONE-HALF TO ONE) TABLET BY MOUTH AT BEDTIME AS NEEDED FOR SLEEP   valACYclovir (VALTREX) 1000 MG tablet Take 1 tablet by mouth once daily   vitamin B-12 (CYANOCOBALAMIN) 1000 MCG tablet Take 1,000 mcg by mouth daily.   vitamin E 400 UNIT capsule Take 400 Units by mouth 2 (two) times daily.   ibuprofen (ADVIL) 600 MG tablet Take 1 tablet (600 mg total) by mouth every 6 (six) hours as needed. (Patient not taking: Reported on 01/04/2022)   No facility-administered medications prior to visit.    Review of Systems  Constitutional:  Negative for appetite change, chills and fever.  Respiratory:  Negative for chest tightness, shortness of breath and wheezing.   Cardiovascular:  Negative for chest pain and palpitations.  Gastrointestinal:  Negative for abdominal pain, nausea and vomiting.       Objective    BP 120/68 (BP Location: Right Arm, Patient Position: Sitting, Cuff Size: Normal)   Pulse 74   Temp 98.4 F (36.9 C) (Oral)   Resp 14   Wt 176 lb (79.8 kg)   SpO2 100% Comment: room air  BMI 23.87 kg/m    Physical Exam   General: Appearance:    Well developed, well nourished male in no acute distress  Eyes:    PERRL, conjunctiva/corneas clear, EOM's intact       Lungs:     Clear to auscultation bilaterally, respirations unlabored  Heart:    Normal heart rate. Normal rhythm. No murmurs, rubs, or gallops.    MS:   All extremities are intact.    Neurologic:   Awake, alert, oriented x 3. No apparent focal neurological defect.         Results for orders placed or performed in visit on 01/04/22  POCT HgB A1C  Result Value Ref Range   Hemoglobin A1C 7.0 (A) 4.0 - 5.6 %   Est. average glucose Bld gHb Est-mCnc 154     Assessment & Plan      1. Type 2 diabetes mellitus without complication, without long-term current use of insulin (HCC) Well controlled. Continue current medications.  Recommend routine eye exam.   2. Hip pain, chronic, right Try  meloxicam (MOBIC) 7.5 MG tablet; Take 1-2 tablets (7.5-15 mg total) by mouth daily. For arthritis in hip/knee  Dispense: 60 tablet; Refill: 1  3. Primary osteoarthritis of right knee Try meloxicam (MOBIC) 7.5 MG tablet; Take 1-2 tablets (7.5-15 mg total) by mouth daily. For arthritis in hip/knee  Dispense: 60 tablet; Refill: 1  4. Numerous skin moles  - Ambulatory referral to Dermatology   Future Appointments  Date Time Provider [place]  05/10/2022  8:00 AM Birdie Sons, MD [id number]        The entirety of the information documented in the History of Present Illness, Review of Systems and Physical Exam were personally obtained by me. Portions of this information were initially documented by the CMA and reviewed by me for thoroughness and accuracy.     Lelon Huh, MD  [facility] [phone] (phone) [phone] (fax)  [place]

## 2022-01-04 ENCOUNTER — Encounter: Payer: Self-pay | Admitting: Family Medicine

## 2022-01-04 ENCOUNTER — Ambulatory Visit (INDEPENDENT_AMBULATORY_CARE_PROVIDER_SITE_OTHER): Payer: Medicare HMO | Admitting: Family Medicine

## 2022-01-04 VITALS — BP 120/68 | HR 74 | Temp 98.4°F | Resp 14 | Wt 176.0 lb

## 2022-01-04 DIAGNOSIS — D229 Melanocytic nevi, unspecified: Secondary | ICD-10-CM

## 2022-01-04 DIAGNOSIS — M25551 Pain in right hip: Secondary | ICD-10-CM | POA: Diagnosis not present

## 2022-01-04 DIAGNOSIS — M1711 Unilateral primary osteoarthritis, right knee: Secondary | ICD-10-CM

## 2022-01-04 DIAGNOSIS — G8929 Other chronic pain: Secondary | ICD-10-CM

## 2022-01-04 DIAGNOSIS — E119 Type 2 diabetes mellitus without complications: Secondary | ICD-10-CM

## 2022-01-04 LAB — POCT GLYCOSYLATED HEMOGLOBIN (HGB A1C)
Est. average glucose Bld gHb Est-mCnc: 154
Hemoglobin A1C: 7 % — AB (ref 4.0–5.6)

## 2022-01-04 MED ORDER — MELOXICAM 7.5 MG PO TABS: 7.5000 mg | ORAL_TABLET | Freq: Every day | ORAL | 1 refills | Status: AC

## 2022-01-04 NOTE — Patient Instructions (Addendum)
.   Please review the attached list of medications and notify my office if there are any errors.   . Please contact your eyecare professional to schedule a routine eye exam  

## 2022-01-30 ENCOUNTER — Other Ambulatory Visit: Payer: Self-pay | Admitting: Family Medicine

## 2022-01-30 DIAGNOSIS — R12 Heartburn: Secondary | ICD-10-CM

## 2022-01-31 ENCOUNTER — Other Ambulatory Visit: Payer: Self-pay | Admitting: Family Medicine

## 2022-02-01 MED ORDER — TRAZODONE HCL 50 MG PO TABS: ORAL_TABLET | ORAL | 1 refills | Status: DC

## 2022-02-03 ENCOUNTER — Ambulatory Visit
Admission: RE | Admit: 2022-02-03 | Discharge: 2022-02-03 | Disposition: A | Discharge status: Home / Self Care | Payer: Medicare HMO | Source: Ambulatory Visit | Attending: Emergency Medicine | Admitting: Emergency Medicine

## 2022-02-03 VITALS — BP 134/75 | HR 75 | Temp 97.7°F | Resp 15 | Ht 72.0 in | Wt 167.0 lb

## 2022-02-03 DIAGNOSIS — M436 Torticollis: Secondary | ICD-10-CM

## 2022-02-03 LAB — SARS CORONAVIRUS 2 BY RT PCR: SARS Coronavirus 2 by RT PCR: NEGATIVE

## 2022-02-03 MED ORDER — IBUPROFEN 600 MG PO TABS: 600.0000 mg | ORAL_TABLET | Freq: Four times a day (QID) | ORAL | 0 refills | Status: AC | PRN

## 2022-02-03 MED ORDER — BACLOFEN 10 MG PO TABS: 10.0000 mg | ORAL_TABLET | Freq: Three times a day (TID) | ORAL | 0 refills | Status: DC

## 2022-02-03 NOTE — Discharge Instructions (Addendum)
Take the ibuprofen every 6 hours with food for relief of the pain and inflammation in your neck.  Take the baclofen every 8 hours to help alleviate spasm.  Take both these medication on a schedule for the next 48 hours and you can back off to an as-needed basis.  Follow the neck range of motion exercises given your discharge paperwork.  This will loosen up the muscles and aid in pain relief.  Following exercise please apply moist heat to your neck.  This is best achieved by taking a hand towel and running under hot Water, bringing it out, and then applying it to your neck with a heating pad over top.  Leave in place for 20 minutes.  If you develop any new symptoms, or worsening symptoms, either return for reevaluation or see your primary care provider.

## 2022-02-03 NOTE — ED Triage Notes (Signed)
Patient states that he woke up last Saturday morning with neck pain and stiffness.  Patient denies any injury.  Patient states he wants a covid test today.  Patient denies any cold symptoms or fevers.

## 2022-02-03 NOTE — ED Provider Notes (Signed)
[facility]    CSN: [id number] Arrival date & time: 02/03/22  1038      History   Chief Complaint Chief Complaint  Patient presents with   Neck Pain    HPI Billy Hardin is a 71 y.o. male.   HPI  71 year old male here for evaluation of right-sided neck pain.  Patient reports that he woke up 6 days ago with pain in the right side of his neck that increases with rotation, flexion, and extension.  He states the pain is not too bad when he gets up in the morning but gets worse as the day goes on.  This is not associated with any numbness, tingling, or weakness in his arms.  He has had a headache for the last couple days but he denies any fever, runny nose, nasal congestion, sore throat, cough, heavy lifting, or injury.  Past Medical History:  Diagnosis Date   Chronic migraine without aura without status migrainosus, not intractable 07/04/2540   Complication of anesthesia    had catheter 4 days unable to void   Deaf    Rt ear   Diabetes mellitus without complication ([place])    Elevated lipids    Herpes    Insomnia    Seasonal allergies    TMJ (dislocation of temporomandibular joint)    Uses hearing aid    Left    Patient Active Problem List   Diagnosis Date Noted   Hypertension associated with diabetes ([place]) 08/12/2018   Hyperlipidemia associated with type 2 diabetes mellitus ([place]) 08/12/2018   History of migraine headaches 04/29/2018   Chronic fatigue, unspecified 02/08/2017   Depression 05/30/2016   Male hypogonadism 05/25/2015   Genital herpes 05/25/2015   Situational stress 05/25/2015   History of kidney stones 05/25/2015   Diabetes mellitus type 2, uncomplicated ([place]) 70/62/3762   ASNHL (asymmetrical sensorineural hearing loss) 09/15/2014   H/O adenomatous polyp of colon 08/07/2013   Insomnia 01/21/2008   Family history of cardiovascular disease 02/04/2007    Past Surgical History:  Procedure Laterality Date   KIDNEY STONE SURGERY     MIDDLE  EAR SURGERY     PENILE PROSTHESIS IMPLANT N/A 11/03/2014   Procedure: PENILE PROTHESIS INFLATABLE;  Surgeon: Royston Cowper, MD;  Location: [facility] ORS;  Service: Urology;  Laterality: N/A;   TONSILLECTOMY AND ADENOIDECTOMY         Home Medications    Prior to Admission medications   Medication Sig Start Date End Date Taking? Authorizing Provider  atorvastatin (LIPITOR) 20 MG tablet Take 1 tablet by mouth once daily 01/30/22  Yes Rumball, Jake Church, DO  baclofen (LIORESAL) 10 MG tablet Take 1 tablet (10 mg total) by mouth 3 (three) times daily. 02/03/22  Yes Margarette Canada, NP  cholecalciferol (VITAMIN D) 400 units TABS tablet Take 400 Units by mouth.   Yes [provider]  ibuprofen (ADVIL) 600 MG tablet Take 1 tablet (600 mg total) by mouth every 6 (six) hours as needed. 02/03/22  Yes Margarette Canada, NP  meloxicam (MOBIC) 7.5 MG tablet Take 1-2 tablets (7.5-15 mg total) by mouth daily. For arthritis in hip/knee 01/04/22  Yes Birdie Sons, MD  metFORMIN (GLUCOPHAGE) 1000 MG tablet TAKE 1 TABLET BY MOUTH TWICE DAILY WITH A MEAL 11/21/21  Yes Birdie Sons, MD  metoprolol succinate (TOPROL-XL) 50 MG 24 hr tablet TAKE 1 TABLET BY MOUTH ONCE DAILY WITH  OR  IMMEDIATELY  FOLLOWING  A  MEAL 08/29/21  Yes Lavon Paganini  M, MD  mirtazapine (REMERON) 30 MG tablet TAKE 1 TABLET BY MOUTH AT BEDTIME 09/05/21  Yes Birdie Sons, MD  Omega-3 Fatty Acids (OMEGA 3 500 PO) Take by mouth.   Yes [provider]  traZODone (DESYREL) 50 MG tablet TAKE 1/2 TO 1 (ONE-HALF TO ONE) TABLET BY MOUTH AT BEDTIME AS NEEDED FOR SLEEP 02/01/22  Yes Birdie Sons, MD  valACYclovir (VALTREX) 1000 MG tablet Take 1 tablet by mouth once daily 11/21/21  Yes Fisher, Kirstie Peri, MD  vitamin B-12 (CYANOCOBALAMIN) 1000 MCG tablet Take 1,000 mcg by mouth daily.   Yes [provider]  vitamin E 400 UNIT capsule Take 400 Units by mouth 2 (two) times daily.   Yes [provider]  blood glucose meter  kit and supplies KIT Dispense based on patient and insurance preference. Check sugar daily 07/01/15   Carmon Ginsberg, PA  CINNAMON PO Take 2 capsules by mouth daily.    [provider]  omeprazole (PRILOSEC) 40 MG capsule TAKE 1 CAPSULE BY MOUTH ONCE DAILY AS NEEDED FOR  HEARTBURN 01/30/22   Myles Gip, DO  ONE TOUCH ULTRA TEST test strip CHECK BLOOD SUGAR DAILY 06/10/16   Carmon Ginsberg, PA  [facility] DELICA LANCETS 03T [place] TEST BLOOD SUGAR DAILY 07/15/16   Carmon Ginsberg, PA    Family History Family History  Problem Relation Age of Onset   Cancer Mother    CAD Brother     Social History Social History   Tobacco Use   Smoking status: Never   Smokeless tobacco: Never  Vaping Use   Vaping Use: Never used  Substance Use Topics   Alcohol use: Yes    Alcohol/week: 0.0 standard drinks of alcohol    Comment: 1-2 drinks total of 2-3x a year   Drug use: No     Allergies   Bupropion, Sertraline, and Penicillins   Review of Systems Review of Systems  Constitutional:  Negative for fever.  HENT:  Negative for congestion, ear pain, rhinorrhea and sore throat.   Respiratory:  Negative for cough.   Musculoskeletal:  Positive for neck pain and neck stiffness.  Neurological:  Positive for headaches. Negative for weakness and numbness.  Hematological: Negative.   Psychiatric/Behavioral: Negative.       Physical Exam Triage Vital Signs ED Triage Vitals [02/03/22 1048]  Enc Vitals Group     BP      Pulse      Resp      Temp      Temp src      SpO2      Weight 167 lb (75.8 kg)     Height 6' (1.829 m)     Head Circumference      Peak Flow      Pain Score 3     Pain Loc      Pain Edu?      Excl. in [place]?    No data found.  Updated Vital Signs BP 134/75 (BP Location: Right Arm)   Pulse 75   Temp 97.7 F (36.5 C) (Oral)   Resp 15   Ht 6' (1.829 m)   Wt 167 lb (75.8 kg)   SpO2 100%   BMI 22.65 kg/m   Visual Acuity Right Eye Distance:   Left Eye  Distance:   Bilateral Distance:    Right Eye Near:   Left Eye Near:    Bilateral Near:     Physical Exam Vitals and nursing note reviewed.  Constitutional:      Appearance: Normal appearance. He is not ill-appearing.  HENT:     Head: Normocephalic and atraumatic.  Musculoskeletal:        General: Tenderness present. No swelling, deformity or signs of injury.     Cervical back: Neck supple.     Comments: Muscle tension and tenderness in the right paracervical region.  Lymphadenopathy:     Cervical: No cervical adenopathy.  Skin:    General: Skin is warm and dry.     Capillary Refill: Capillary refill takes less than 2 seconds.     Findings: No bruising or erythema.  Neurological:     General: No focal deficit present.     Mental Status: He is alert and oriented to person, place, and time.  Psychiatric:        Mood and Affect: Mood normal.        Behavior: Behavior normal.        Thought Content: Thought content normal.        Judgment: Judgment normal.      UC Treatments / Results  Labs (all labs ordered are listed, but only abnormal results are displayed) Labs Reviewed  SARS CORONAVIRUS 2 BY RT PCR    EKG   Radiology No results found.  Procedures Procedures (including critical care time)  Medications Ordered in UC Medications - No data to display  Initial Impression / Assessment and Plan / UC Course  I have reviewed the triage vital signs and the nursing notes.  Pertinent labs & imaging results that were available during my care of the patient were reviewed by me and considered in my medical decision making (see chart for details).   Patient is a pleasant, nontoxic-appearing 71 year old male here for evaluation of right-sided neck pain and stiffness that is been going on for last 6 days.  Over the last couple days he endorsed that he is woken up with a headache every day.  He has no numbness, tingling, weakness of extremities.  He denies any upper or lower  respiratory symptoms.  He states that the pain is better when he gets up in the morning and gets worse as the day goes on.  On exam patient's cervical spine is in normal alignment and there is no tenderness or step-off with palpation of the spinous processes of the cervical vertebrae.  There is tension without overt spasm in the superior aspect of the right trapezius in the right paracervical region.  This extends down into the shoulder but not down into the thoracic spine.  The left side is negative.  Patient has approximately 60 degrees of left lateral rotation before she develops pain in the right side of his neck.  He can get to 45 degrees with right lateral rotation before the pain begins in his neck.  He has normal flexion and extension of his neck but both cause pain at the end of the range of motion.  Bilateral grips and upper extremity strength are 5/5.  Patient exam is consistent with torticollis.  I will treat him with ibuprofen, baclofen, and home physical therapy.  We have also discussed using moist heat.  Patient is requesting a home COVID test as well.   Final Clinical Impressions(s) / UC Diagnoses   Final diagnoses:  Torticollis     Discharge Instructions      Take the ibuprofen every 6 hours with food for relief of the pain and inflammation in your neck.  Take the baclofen every  8 hours to help alleviate spasm.  Take both these medication on a schedule for the next 48 hours and you can back off to an as-needed basis.  Follow the neck range of motion exercises given your discharge paperwork.  This will loosen up the muscles and aid in pain relief.  Following exercise please apply moist heat to your neck.  This is best achieved by taking a hand towel and running under hot Water, bringing it out, and then applying it to your neck with a heating pad over top.  Leave in place for 20 minutes.  If you develop any new symptoms, or worsening symptoms, either return for reevaluation or  see your primary care provider.     ED Prescriptions     Medication Sig Dispense Auth. Provider   baclofen (LIORESAL) 10 MG tablet Take 1 tablet (10 mg total) by mouth 3 (three) times daily. 30 each Margarette Canada, NP   ibuprofen (ADVIL) 600 MG tablet Take 1 tablet (600 mg total) by mouth every 6 (six) hours as needed. 30 tablet Margarette Canada, NP      PDMP not reviewed this encounter.   Margarette Canada, NP 02/03/22 1122

## 2022-02-21 ENCOUNTER — Other Ambulatory Visit: Payer: Self-pay | Admitting: Family Medicine

## 2022-02-21 DIAGNOSIS — E119 Type 2 diabetes mellitus without complications: Secondary | ICD-10-CM

## 2022-03-02 DIAGNOSIS — L821 Other seborrheic keratosis: Secondary | ICD-10-CM | POA: Diagnosis not present

## 2022-03-02 DIAGNOSIS — L820 Inflamed seborrheic keratosis: Secondary | ICD-10-CM | POA: Diagnosis not present

## 2022-03-02 DIAGNOSIS — D485 Neoplasm of uncertain behavior of skin: Secondary | ICD-10-CM | POA: Diagnosis not present

## 2022-03-07 ENCOUNTER — Other Ambulatory Visit: Payer: Self-pay | Admitting: Family Medicine

## 2022-03-07 DIAGNOSIS — G4700 Insomnia, unspecified: Secondary | ICD-10-CM

## 2022-03-07 DIAGNOSIS — G43709 Chronic migraine without aura, not intractable, without status migrainosus: Secondary | ICD-10-CM

## 2022-03-07 DIAGNOSIS — F3289 Other specified depressive episodes: Secondary | ICD-10-CM

## 2022-03-17 DIAGNOSIS — Z86010 Personal history of colonic polyps: Secondary | ICD-10-CM | POA: Diagnosis not present

## 2022-03-17 DIAGNOSIS — K5730 Diverticulosis of large intestine without perforation or abscess without bleeding: Secondary | ICD-10-CM | POA: Diagnosis not present

## 2022-04-18 DIAGNOSIS — K5730 Diverticulosis of large intestine without perforation or abscess without bleeding: Secondary | ICD-10-CM | POA: Diagnosis not present

## 2022-04-18 DIAGNOSIS — K640 First degree hemorrhoids: Secondary | ICD-10-CM | POA: Diagnosis not present

## 2022-04-18 DIAGNOSIS — Z86010 Personal history of colonic polyps: Secondary | ICD-10-CM | POA: Diagnosis not present

## 2022-04-18 DIAGNOSIS — Z1211 Encounter for screening for malignant neoplasm of colon: Secondary | ICD-10-CM | POA: Diagnosis not present

## 2022-05-04 ENCOUNTER — Other Ambulatory Visit: Payer: Self-pay | Admitting: Family Medicine

## 2022-05-04 DIAGNOSIS — R12 Heartburn: Secondary | ICD-10-CM

## 2022-05-04 NOTE — Telephone Encounter (Signed)
Requested Prescriptions  Pending Prescriptions Disp Refills   omeprazole (PRILOSEC) 40 MG capsule [Pharmacy Med Name: Omeprazole 40 MG Oral Capsule Delayed Release] 90 capsule 0    Sig: TAKE 1 CAPSULE BY MOUTH ONCE DAILY AS NEEDED FOR HEART     Gastroenterology: Proton Pump Inhibitors Passed - 05/04/2022 10:42 AM      Passed - Valid encounter within last 12 months    Recent Outpatient Visits           4 months ago Type 2 diabetes mellitus without complication, without long-term current use of insulin ([place])   [facility] Birdie Sons, MD   5 months ago Hip pain, chronic, right   [facility] [place], [place], PA-C   5 months ago Hip pain, chronic, right   [employer], [place], PA-C   9 months ago Type 2 diabetes mellitus without complication, without long-term current use of insulin [facility])   [facility] Birdie Sons, MD   9 months ago No-show for appointment   [place], MD       Future Appointments             In 6 days Fisher, Kirstie Peri, MD [facility], [place]

## 2022-05-07 ENCOUNTER — Other Ambulatory Visit: Payer: Self-pay | Admitting: Family Medicine

## 2022-05-09 NOTE — Progress Notes (Unsigned)
I,April Miller,acting as a scribe for Lelon Huh, MD.,have documented all relevant documentation on the behalf of Lelon Huh, MD,as directed by  Lelon Huh, MD while in the presence of Lelon Huh, MD.   Established patient visit   Patient: Billy Hardin   DOB: 23-Mar-1951   71 y.o. Male  MRN: [id number] Visit Date: 05/10/2022  Today's healthcare provider: Lelon Huh, MD   Chief Complaint  Patient presents with   Diabetes   Hypertension   Hyperlipidemia   Subjective    HPI  Diabetes Mellitus Type II, follow-up  Lab Results  Component Value Date   HGBA1C 7.0 (A) 01/04/2022   HGBA1C 6.8 (H) 07/29/2021   HGBA1C 7.1 (A) 03/07/2021   Last seen for diabetes 4 months ago.  Management since then includes continuing the same treatment.  Home blood sugar records: fasting range: not checking Most Recent Eye Exam: ?  --------------------------------------------------------------------------------------------------- Hypertension, follow-up  BP Readings from Last 3 Encounters:  05/10/22 117/60  02/03/22 134/75  01/04/22 120/68   Wt Readings from Last 3 Encounters:  05/10/22 180 lb (81.6 kg)  02/03/22 167 lb (75.8 kg)  01/04/22 176 lb (79.8 kg)     He was last seen for hypertension 4 months ago.  Management since that visit includes; taking metoprolol 50 mg.  Outside blood pressures are occasionally. --------------------------------------------------------------------------------------------------- Lipid/Cholesterol, follow-up  Last Lipid Panel: Lab Results  Component Value Date   CHOL 110 07/29/2021   LDLCALC 43 07/29/2021   HDL 50 07/29/2021   TRIG 89 07/29/2021     Management since that visit includes; taking atorvastatin 20 mg.  Last metabolic panel Lab Results  Component Value Date   GLUCOSE 156 (H) 07/29/2021   NA 143 07/29/2021   K 4.2 07/29/2021   BUN 16 07/29/2021   CREATININE 1.09 07/29/2021   EGFR 73 07/29/2021   GFRNONAA >60  05/13/2020   CALCIUM 9.5 07/29/2021   AST 10 07/29/2021   ALT 10 07/29/2021   The ASCVD Risk score (Arnett DK, et al., 2019) failed to calculate for the following reasons:   The valid total cholesterol range is 130 to 320 mg/dL  ---------------------------------------------------------------------------------------------------   Medications: Outpatient Medications Prior to Visit  Medication Sig   atorvastatin (LIPITOR) 20 MG tablet Take 1 tablet by mouth once daily   baclofen (LIORESAL) 10 MG tablet Take 1 tablet (10 mg total) by mouth 3 (three) times daily.   blood glucose meter kit and supplies KIT Dispense based on patient and insurance preference. Check sugar daily   cholecalciferol (VITAMIN D) 400 units TABS tablet Take 400 Units by mouth.   CINNAMON PO Take 2 capsules by mouth daily.   ibuprofen (ADVIL) 600 MG tablet Take 1 tablet (600 mg total) by mouth every 6 (six) hours as needed.   meloxicam (MOBIC) 7.5 MG tablet Take 1-2 tablets (7.5-15 mg total) by mouth daily. For arthritis in hip/knee   metFORMIN (GLUCOPHAGE) 1000 MG tablet TAKE 1 TABLET BY MOUTH TWICE DAILY WITH A MEAL   metoprolol succinate (TOPROL-XL) 50 MG 24 hr tablet TAKE 1 TABLET BY MOUTH ONCE DAILY WITH  OR  IMMEDIATELY  FOLLOWING  A  MEAL   mirtazapine (REMERON) 30 MG tablet TAKE 1 TABLET BY MOUTH AT BEDTIME   Omega-3 Fatty Acids (OMEGA 3 500 PO) Take by mouth.   omeprazole (PRILOSEC) 40 MG capsule TAKE 1 CAPSULE BY MOUTH ONCE DAILY AS NEEDED FOR HEART   ONE TOUCH ULTRA TEST test strip CHECK BLOOD SUGAR  DAILY   ONETOUCH DELICA LANCETS 16X MISC TEST BLOOD SUGAR DAILY   traZODone (DESYREL) 50 MG tablet TAKE 1/2 TO 1 (ONE-HALF TO ONE) TABLET BY MOUTH AT BEDTIME AS NEEDED FOR SLEEP   valACYclovir (VALTREX) 1000 MG tablet Take 1 tablet by mouth once daily   vitamin B-12 (CYANOCOBALAMIN) 1000 MCG tablet Take 1,000 mcg by mouth daily.   vitamin E 400 UNIT capsule Take 400 Units by mouth 2 (two) times daily.   No  facility-administered medications prior to visit.    Review of Systems  Constitutional:  Negative for appetite change, chills and fever.  Respiratory:  Negative for chest tightness, shortness of breath and wheezing.   Cardiovascular:  Negative for chest pain and palpitations.  Gastrointestinal:  Negative for abdominal pain, nausea and vomiting.       Objective    BP 117/60 (BP Location: Left Arm, Patient Position: Sitting, Cuff Size: Large)   Pulse 69   Resp 16   Wt 180 lb (81.6 kg)   SpO2 99%   BMI 24.41 kg/m    Physical Exam  General appearance: Well developed, well nourished male, cooperative and in no acute distress Head: Normocephalic, without obvious abnormality, atraumatic Respiratory: Respirations even and unlabored, normal respiratory rate Extremities: All extremities are intact.  Skin: Skin color, texture, turgor normal. No rashes seen  Psych: Appropriate mood and affect. Neurologic: Mental status: Alert, oriented to person, place, and time, thought content appropriate.   Results for orders placed or performed in visit on 05/10/22  POCT glycosylated hemoglobin (Hb A1C)  Result Value Ref Range   Hemoglobin A1C 7.2 (A) 4.0 - 5.6 %    Assessment & Plan     1. Type 2 diabetes mellitus without complication, without long-term current use of insulin (H[facility]) Doing well current meds. A1c up slightly this year. Has not been exercising as much the last few months due to holidays. He plans and getting back into daily exercise routine riding stationary bike after the new year.   2. Hypertension associated with diabetes ([place]) Well controlled.  Continue current medications.     Future Appointments  Date Time Provider [place]  08/08/2022  9:00 AM Caryn Section, Kirstie Peri, MD [facility]        The entirety of the information documented in the History of Present Illness, Review of Systems and Physical Exam were personally obtained by me. Portions of this information were  initially documented by the CMA and reviewed by me for thoroughness and accuracy.     Lelon Huh, MD  [facility] [phone] (phone) [phone] (fax)  [place]

## 2022-05-10 ENCOUNTER — Encounter: Payer: Self-pay | Admitting: Family Medicine

## 2022-05-10 ENCOUNTER — Ambulatory Visit (INDEPENDENT_AMBULATORY_CARE_PROVIDER_SITE_OTHER): Payer: Medicare HMO | Admitting: Family Medicine

## 2022-05-10 VITALS — BP 117/60 | HR 69 | Resp 16 | Wt 180.0 lb

## 2022-05-10 DIAGNOSIS — I152 Hypertension secondary to endocrine disorders: Secondary | ICD-10-CM

## 2022-05-10 DIAGNOSIS — E1159 Type 2 diabetes mellitus with other circulatory complications: Secondary | ICD-10-CM | POA: Diagnosis not present

## 2022-05-10 DIAGNOSIS — E119 Type 2 diabetes mellitus without complications: Secondary | ICD-10-CM

## 2022-05-10 LAB — POCT GLYCOSYLATED HEMOGLOBIN (HGB A1C): Hemoglobin A1C: 7.2 % — AB (ref 4.0–5.6)

## 2022-05-10 NOTE — Patient Instructions (Addendum)
.   Please review the attached list of medications and notify my office if there are any errors.   . Please bring all of your medications to every appointment so we can make sure that our medication list is the same as yours.    The CDC recommends two doses of Shingrix (the shingles vaccine) separated by 2 to 6 months for adults age 71 years and older. I recommend checking with your pharmacy plan regarding coverage for this vaccine.     

## 2022-05-23 ENCOUNTER — Other Ambulatory Visit: Payer: Self-pay | Admitting: Family Medicine

## 2022-05-23 DIAGNOSIS — E119 Type 2 diabetes mellitus without complications: Secondary | ICD-10-CM

## 2022-05-23 NOTE — Telephone Encounter (Signed)
Requested Prescriptions  Pending Prescriptions Disp Refills   metFORMIN (GLUCOPHAGE) 1000 MG tablet [Pharmacy Med Name: metFORMIN HCl 1000 MG Oral Tablet] 180 tablet 0    Sig: TAKE 1 TABLET BY MOUTH TWICE DAILY WITH A MEAL     Endocrinology:  Diabetes - Biguanides Failed - 05/23/2022 11:10 AM      Failed - B12 Level in normal range and within 720 days    Vitamin B-12  Date Value Ref Range Status  06/04/2017 718 232 - 1,245 pg/mL Final         Failed - CBC within normal limits and completed in the last 12 months    WBC  Date Value Ref Range Status  07/29/2021 5.9 3.4 - 10.8 x10E3/uL Final  05/13/2020 5.1 4.0 - 10.5 K/uL Final   RBC  Date Value Ref Range Status  07/29/2021 4.15 4.14 - 5.80 x10E6/uL Final  05/13/2020 4.29 4.22 - 5.81 MIL/uL Final   Hemoglobin  Date Value Ref Range Status  07/29/2021 13.2 13.0 - 17.7 g/dL Final   Hematocrit  Date Value Ref Range Status  07/29/2021 38.8 37.5 - 51.0 % Final   MCHC  Date Value Ref Range Status  07/29/2021 34.0 31.5 - 35.7 g/dL Final  05/13/2020 33.5 30.0 - 36.0 g/dL Final   [facility]  Date Value Ref Range Status  07/29/2021 31.8 26.6 - 33.0 pg Final  05/13/2020 32.2 26.0 - 34.0 pg Final   MCV  Date Value Ref Range Status  07/29/2021 94 79 - 97 fL Final  05/27/2014 95 80 - 100 fL Final   No results found for: "PLTCOUNTKUC", "LABPLAT", "POCPLA" RDW  Date Value Ref Range Status  07/29/2021 12.7 11.6 - 15.4 % Final  05/27/2014 12.7 11.5 - 14.5 % Final         Passed - Cr in normal range and within 360 days    Creat  Date Value Ref Range Status  02/06/2017 0.95 0.70 - 1.25 mg/dL Final    Comment:    For patients >80 years of age, the reference limit for Creatinine is approximately 13% higher for people identified as African-American. .    Creatinine, Ser  Date Value Ref Range Status  07/29/2021 1.09 0.76 - 1.27 mg/dL Final         Passed - HBA1C is between 0 and 7.9 and within 180 days    Hemoglobin A1C  Date  Value Ref Range Status  05/10/2022 7.2 (A) 4.0 - 5.6 % Final  11/04/2012 6.8 (H) 4.2 - 6.3 % Final    Comment:    The American Diabetes Association recommends that a primary goal of therapy should be <7% and that physicians should reevaluate the treatment regimen in patients with HbA1c values consistently >8%.    Hgb A1c MFr Bld  Date Value Ref Range Status  07/29/2021 6.8 (H) 4.8 - 5.6 % Final    Comment:             Prediabetes: 5.7 - 6.4          Diabetes: >6.4          Glycemic control for adults with diabetes: <7.0          Passed - eGFR in normal range and within 360 days    GFR, Est African American  Date Value Ref Range Status  02/06/2017 97 > OR = 60 mL/min/1.76m Final   GFR calc Af Amer  Date Value Ref Range Status  07/29/2019 71 >59 mL/min/1.73 Final  GFR, Est Non African American  Date Value Ref Range Status  02/06/2017 84 > OR = 60 mL/min/1.48m Final   GFR, Estimated  Date Value Ref Range Status  05/13/2020 >60 >60 mL/min Final    Comment:    (NOTE) Calculated using the CKD-EPI Creatinine Equation (2021)    eGFR  Date Value Ref Range Status  07/29/2021 73 >59 mL/min/1.73 Final         Passed - Valid encounter within last 6 months    Recent Outpatient Visits           1 week ago Type 2 diabetes mellitus without complication, without long-term current use of insulin ([place]   [facility]   4 months ago Type 2 diabetes mellitus without complication, without long-term current use of insulin ([place]   [facility]   6 months ago Hip pain, chronic, right   [facility] [facility]   6 months ago Hip pain, chronic, right   [facility] [place] [facility]   9 months ago Type 2 diabetes mellitus without complication, without long-term current use of insulin ([facility]       Future Appointments              In 2 months Fisher, DKirstie Peri MD [facility] PR[facility]

## 2022-06-05 ENCOUNTER — Other Ambulatory Visit: Payer: Self-pay | Admitting: Family Medicine

## 2022-06-05 DIAGNOSIS — G4700 Insomnia, unspecified: Secondary | ICD-10-CM

## 2022-06-05 DIAGNOSIS — F3289 Other specified depressive episodes: Secondary | ICD-10-CM

## 2022-06-05 DIAGNOSIS — G43709 Chronic migraine without aura, not intractable, without status migrainosus: Secondary | ICD-10-CM

## 2022-07-04 ENCOUNTER — Telehealth: Payer: Self-pay | Admitting: Family Medicine

## 2022-07-04 NOTE — Telephone Encounter (Signed)
Contacted Billy Hardin to schedule their annual wellness visit. Appointment made for 08/02/2022.  [place] Direct Dial: [phone]

## 2022-07-04 NOTE — Telephone Encounter (Signed)
Contacted Billy Hardin to schedule their annual wellness visit. Appointment made for 08/07/2022.  [place] Direct Dial: [phone]

## 2022-07-25 IMAGING — CR DG CHEST 2V
2 series · 2 of 2 positions shown · non-contrast
Comparison: 07/25/2018

CLINICAL DATA: Chest tightness, cough

EXAM:
CHEST - 2 VIEW

[chest pa]
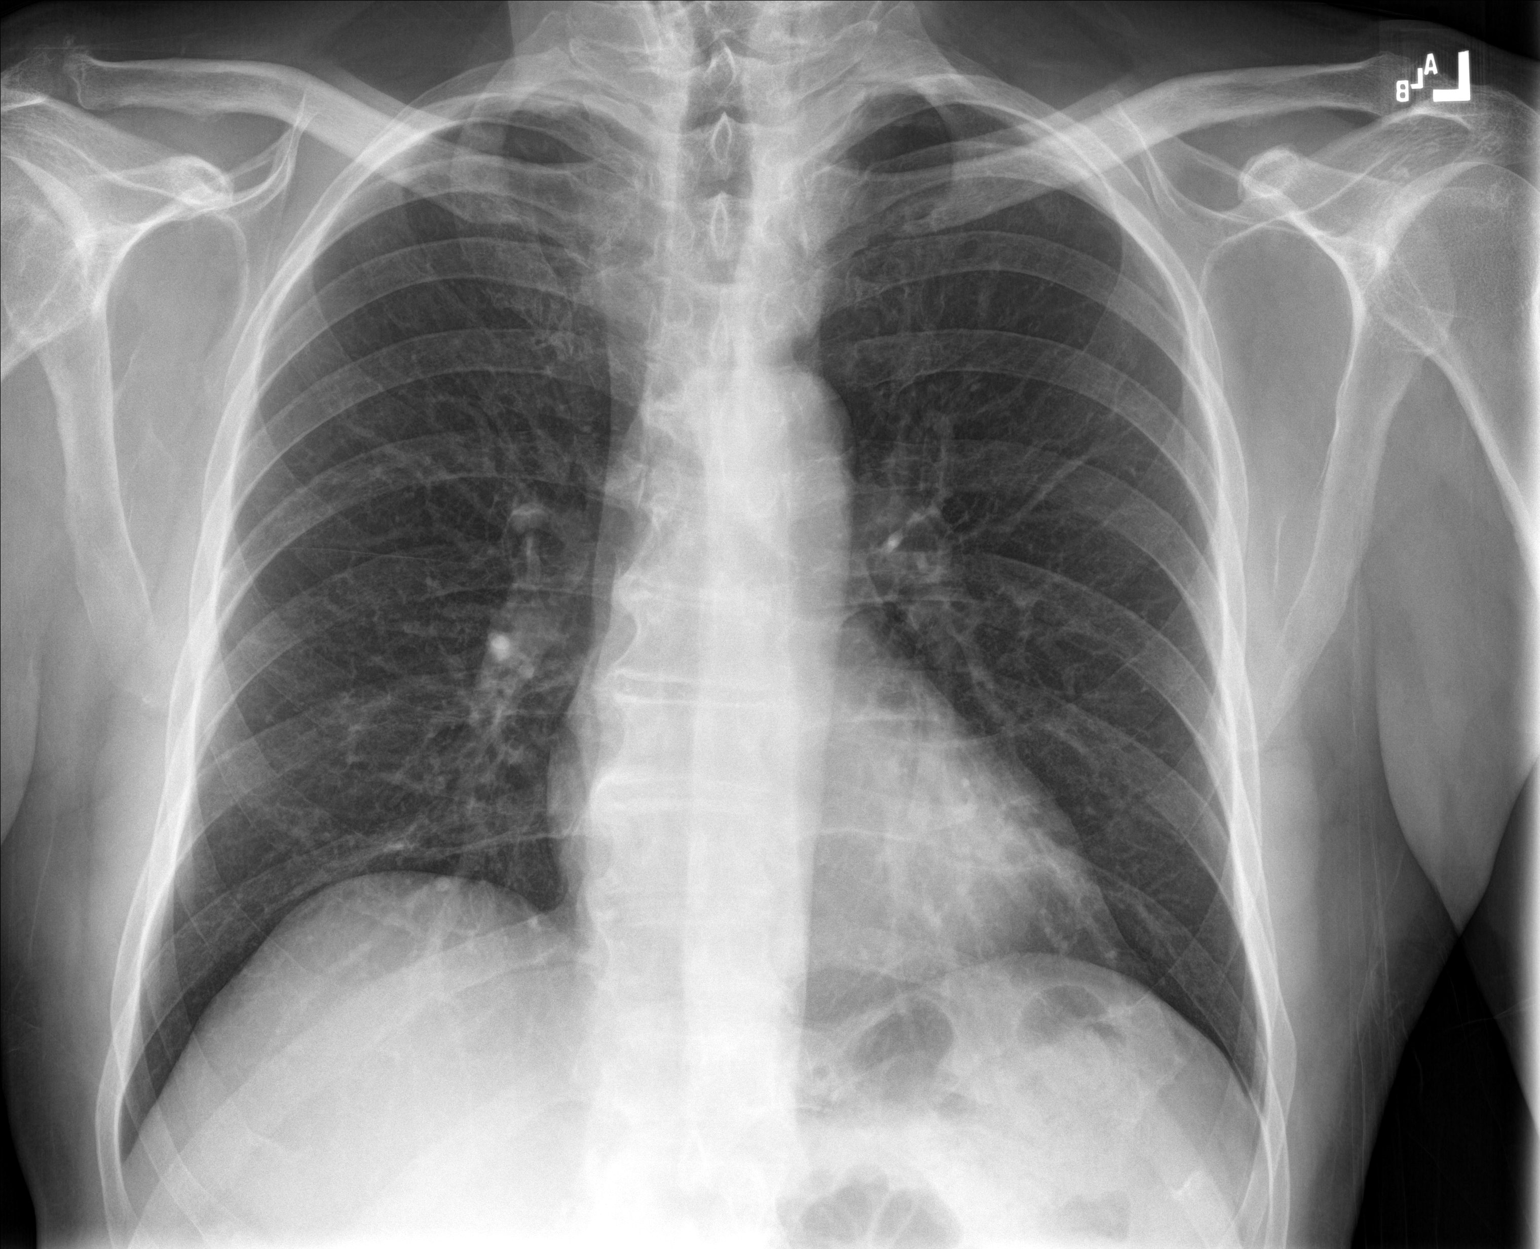

[chest lat]
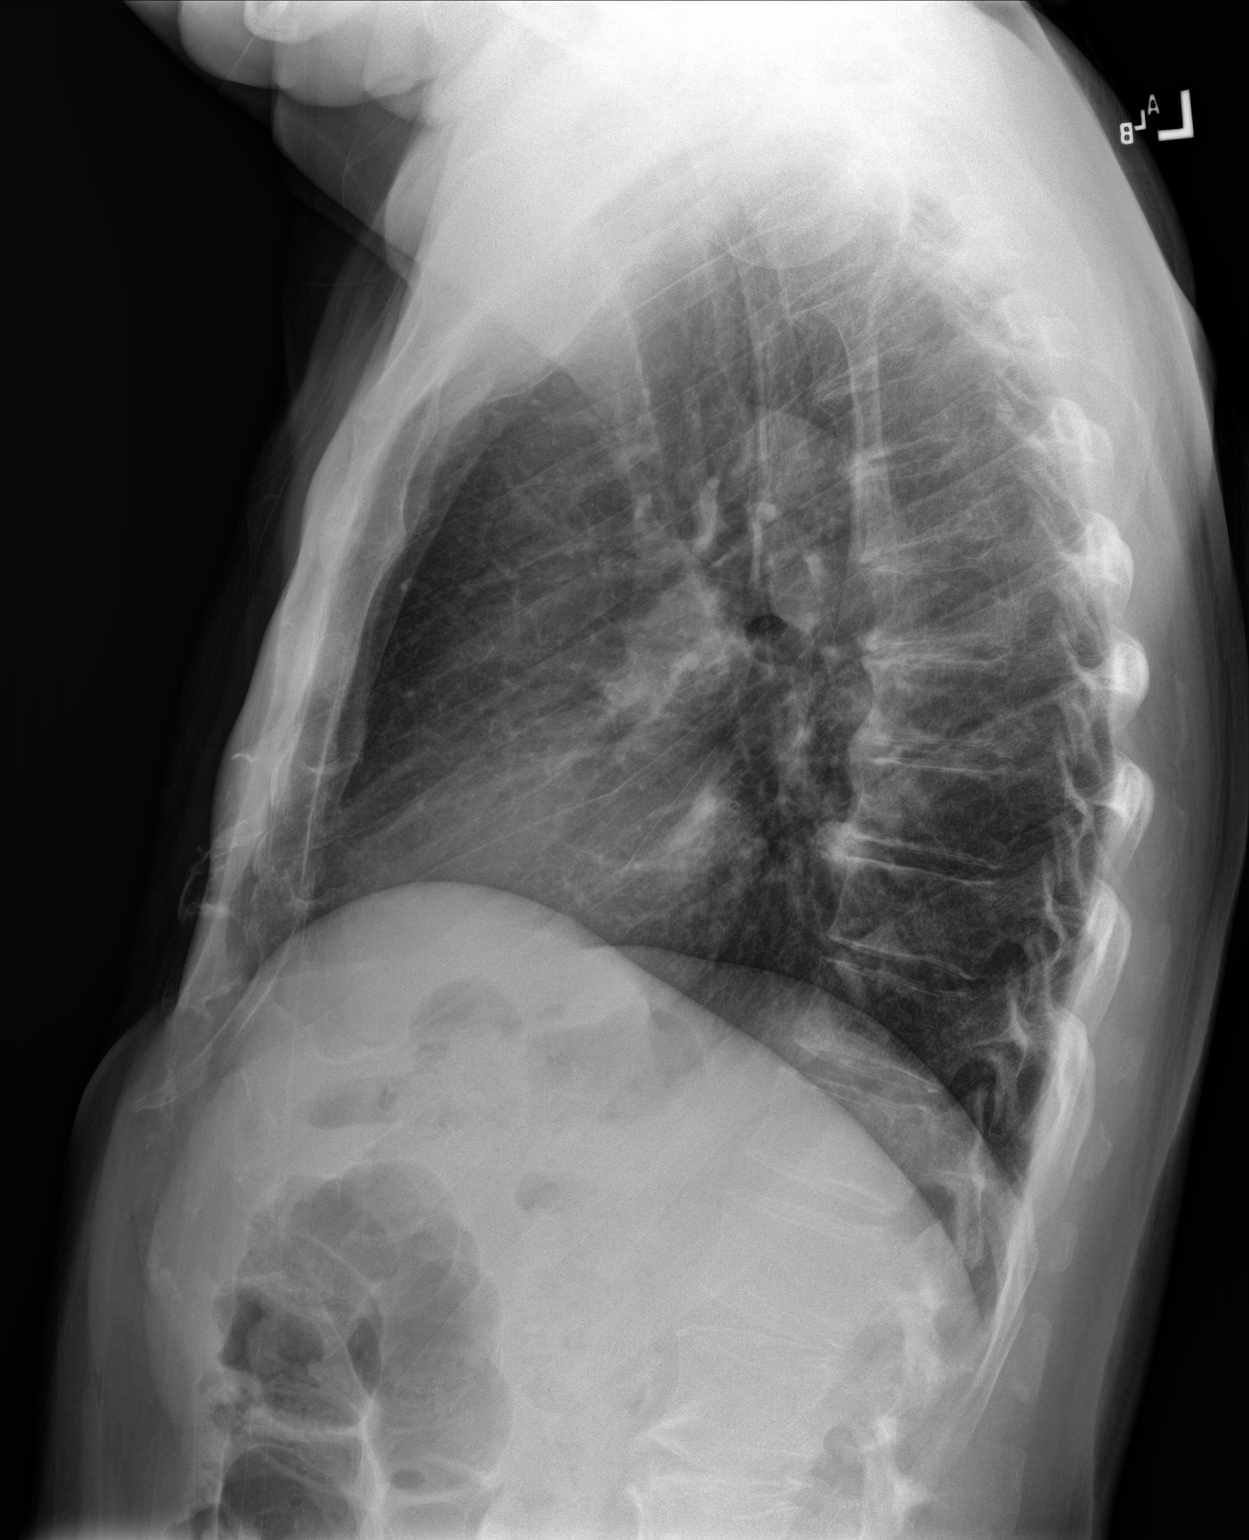

[2 of 2 positions shown; findings below may reference images not displayed]

FINDINGS: Heart and mediastinal contours are within normal limits. No focal
opacities or effusions. No acute bony abnormality.
IMPRESSION: No active cardiopulmonary disease.

## 2022-08-02 ENCOUNTER — Other Ambulatory Visit: Payer: Self-pay | Admitting: Family Medicine

## 2022-08-02 DIAGNOSIS — R12 Heartburn: Secondary | ICD-10-CM

## 2022-08-05 ENCOUNTER — Other Ambulatory Visit: Payer: Self-pay

## 2022-08-05 ENCOUNTER — Ambulatory Visit
Admission: EM | Admit: 2022-08-05 | Discharge: 2022-08-05 | Disposition: A | Discharge status: Home / Self Care | Payer: Medicare HMO | Attending: Nurse Practitioner | Admitting: Nurse Practitioner

## 2022-08-05 DIAGNOSIS — R051 Acute cough: Secondary | ICD-10-CM | POA: Diagnosis not present

## 2022-08-05 DIAGNOSIS — U071 COVID-19: Secondary | ICD-10-CM | POA: Diagnosis not present

## 2022-08-05 LAB — SARS CORONAVIRUS 2 BY RT PCR: SARS Coronavirus 2 by RT PCR: POSITIVE — AB

## 2022-08-05 MED ORDER — PAXLOVID (300/100) 20 X 150 MG & 10 X 100MG PO TBPK: 3.0000 | ORAL_TABLET | Freq: Two times a day (BID) | ORAL | 0 refills | Status: AC

## 2022-08-05 NOTE — ED Provider Notes (Signed)
[facility]    CSN: [id number] Arrival date & time: 08/05/22  1034      History   Chief Complaint Chief Complaint  Patient presents with   Covid Positive    HPI Billy Hardin is a 72 y.o. male  presents for evaluation of URI symptoms for 2 days. Patient reports associated symptoms of, congestion, sore throat, headache. Denies N/V/D, ear pain, fevers, body aches, shortness of breath. Patient does not have a hx of asthma or smoking. No known sick contacts.  Pt has taken nothing OTC for symptoms.  For COVID.  He has had COVID in the past without hospitalization or complication.  He states he had a positive home COVID test this morning.  Pt has no other concerns at this time.   HPI  Past Medical History:  Diagnosis Date   Chronic migraine without aura without status migrainosus, not intractable [id number]   Complication of anesthesia    had catheter 4 days unable to void   Deaf    Rt ear   Diabetes mellitus without complication ([place])    Elevated lipids    Herpes    Insomnia    Seasonal allergies    TMJ (dislocation of temporomandibular joint)    Uses hearing aid    Left    Patient Active Problem List   Diagnosis Date Noted   Hypertension associated with diabetes ([place]) 08/12/2018   Hyperlipidemia associated with type 2 diabetes mellitus ([place]) 08/12/2018   History of migraine headaches 04/29/2018   Chronic fatigue, unspecified 02/08/2017   Depression 05/30/2016   Male hypogonadism 05/25/2015   Genital herpes 05/25/2015   Situational stress 05/25/2015   History of kidney stones 05/25/2015   Diabetes mellitus type 2, uncomplicated ([place]) 0000000   ASNHL (asymmetrical sensorineural hearing loss) 09/15/2014   H/O adenomatous polyp of colon 08/07/2013   Insomnia 01/21/2008   Family history of cardiovascular disease 02/04/2007    Past Surgical History:  Procedure Laterality Date   KIDNEY STONE SURGERY     MIDDLE EAR SURGERY     PENILE PROSTHESIS IMPLANT  N/A 11/03/2014   Procedure: PENILE PROTHESIS INFLATABLE;  Surgeon: Royston Cowper, MD;  Location: [facility] ORS;  Service: Urology;  Laterality: N/A;   TONSILLECTOMY AND ADENOIDECTOMY         Home Medications    Prior to Admission medications   Medication Sig Start Date End Date Taking? Authorizing Provider  nirmatrelvir & ritonavir (PAXLOVID, 300/100,) 20 x 150 MG & 10 x 100MG  TBPK Take 3 tablets by mouth 2 (two) times daily for 5 days. 08/05/22 08/10/22 Yes Melynda Ripple, NP  atorvastatin (LIPITOR) 20 MG tablet Take 1 tablet by mouth once daily 05/10/22   Birdie Sons, MD  baclofen (LIORESAL) 10 MG tablet Take 1 tablet (10 mg total) by mouth 3 (three) times daily. 02/03/22   Margarette Canada, NP  blood glucose meter kit and supplies KIT Dispense based on patient and insurance preference. Check sugar daily 07/01/15   Carmon Ginsberg, PA  cholecalciferol (VITAMIN D) 400 units TABS tablet Take 400 Units by mouth.    [provider]  CINNAMON PO Take 2 capsules by mouth daily.    [provider]  ibuprofen (ADVIL) 600 MG tablet Take 1 tablet (600 mg total) by mouth every 6 (six) hours as needed. 02/03/22   Margarette Canada, NP  meloxicam (MOBIC) 7.5 MG tablet Take 1-2 tablets (7.5-15 mg total) by mouth daily. For arthritis in hip/knee 01/04/22  Birdie Sons, MD  metFORMIN (GLUCOPHAGE) 1000 MG tablet TAKE 1 TABLET BY MOUTH TWICE DAILY WITH A MEAL 05/23/22   Birdie Sons, MD  metoprolol succinate (TOPROL-XL) 50 MG 24 hr tablet TAKE 1 TABLET BY MOUTH ONCE DAILY WITH  OR  IMMEDIATELY  FOLLOWING  A  MEAL 06/06/22   Birdie Sons, MD  mirtazapine (REMERON) 30 MG tablet TAKE 1 TABLET BY MOUTH AT BEDTIME 06/06/22   Birdie Sons, MD  Omega-3 Fatty Acids (OMEGA 3 500 PO) Take by mouth.    [provider]  omeprazole (PRILOSEC) 40 MG capsule TAKE 1 CAPSULE BY MOUTH ONCE DAILY AS NEEDED FOR HEART 08/02/22   Birdie Sons, MD  ONE [facility] ULTRA TEST test strip CHECK BLOOD SUGAR  DAILY 06/10/16   Carmon Ginsberg, PA  [facility] DELICA LANCETS [id number] MISC TEST BLOOD SUGAR DAILY 07/15/16   Carmon Ginsberg, PA  traZODone (DESYREL) 50 MG tablet TAKE 1/2 TO 1 (ONE-HALF TO ONE) TABLET BY MOUTH AT BEDTIME AS NEEDED FOR SLEEP 08/02/22   Birdie Sons, MD  valACYclovir (VALTREX) 1000 MG tablet Take 1 tablet by mouth once daily 11/21/21   Birdie Sons, MD  vitamin B-12 (CYANOCOBALAMIN) 1000 MCG tablet Take 1,000 mcg by mouth daily.    [provider]  vitamin E 400 UNIT capsule Take 400 Units by mouth 2 (two) times daily.    [provider]    Family History Family History  Problem Relation Age of Onset   Cancer Mother    CAD Brother     Social History Social History   Tobacco Use   Smoking status: Never   Smokeless tobacco: Never  Vaping Use   Vaping Use: Never used  Substance Use Topics   Alcohol use: Yes    Alcohol/week: 0.0 standard drinks of alcohol    Comment: 1-2 drinks total of 2-3x a year   Drug use: No     Allergies   Bupropion, Sertraline, and Penicillins   Review of Systems Review of Systems  HENT:  Positive for congestion and sore throat.   Respiratory:  Positive for cough.   Neurological:  Positive for headaches.     Physical Exam Triage Vital Signs ED Triage Vitals  Enc Vitals Group     BP 08/05/22 1050 135/74     Pulse Rate 08/05/22 1050 88     Resp 08/05/22 1050 18     Temp 08/05/22 1050 99 F (37.2 C)     Temp Source 08/05/22 1050 Oral     SpO2 08/05/22 1050 94 %     Weight 08/05/22 1047 175 lb (79.4 kg)     Height 08/05/22 1047 6' (1.829 m)     Head Circumference --      Peak Flow --      Pain Score 08/05/22 1046 0     Pain Loc --      Pain Edu? --      Excl. in [place]? --    No data found.  Updated Vital Signs BP 135/74 (BP Location: Left Arm)   Pulse 88   Temp 99 F (37.2 C) (Oral)   Resp 18   Ht 6' (1.829 m)   Wt 175 lb (79.4 kg)   SpO2 94%   BMI 23.73 kg/m   Visual Acuity Right Eye  Distance:   Left Eye Distance:   Bilateral Distance:    Right Eye Near:   Left Eye Near:    Bilateral  Near:     Physical Exam Vitals and nursing note reviewed.  Constitutional:      General: He is not in acute distress.    Appearance: Normal appearance. He is not ill-appearing or toxic-appearing.  HENT:     Head: Normocephalic and atraumatic.     Right Ear: Tympanic membrane and ear canal normal.     Left Ear: Tympanic membrane and ear canal normal.     Nose: Congestion present.     Mouth/Throat:     Mouth: Mucous membranes are moist.     Pharynx: Posterior oropharyngeal erythema present.  Eyes:     Pupils: Pupils are equal, round, and reactive to light.  Cardiovascular:     Rate and Rhythm: Normal rate and regular rhythm.     Heart sounds: Normal heart sounds.  Pulmonary:     Effort: Pulmonary effort is normal.     Breath sounds: Normal breath sounds.  Musculoskeletal:     Cervical back: Normal range of motion and neck supple.  Lymphadenopathy:     Cervical: No cervical adenopathy.  Skin:    General: Skin is warm and dry.  Neurological:     General: No focal deficit present.     Mental Status: He is alert and oriented to person, place, and time.  Psychiatric:        Mood and Affect: Mood normal.        Behavior: Behavior normal.      UC Treatments / Results  Labs (all labs ordered are listed, but only abnormal results are displayed) Labs Reviewed  SARS CORONAVIRUS 2 BY RT PCR    EKG   Radiology No results found.  Procedures Procedures (including critical care time)  Medications Ordered in UC Medications - No data to display  Initial Impression / Assessment and Plan / UC Course  I have reviewed the triage vital signs and the nursing notes.  Pertinent labs & imaging results that were available during my care of the patient were reviewed by me and considered in my medical decision making (see chart for details).     Reviewed exam and symptoms with  patient.  No red flags 2 days of URI symptoms with reported positive home COVID test.  COVID PCR in clinic pending.  Patient would like to start Paxlovid.  Side effect profile reviewed.  Age and history of diabetes qualifies for treatment.  He denies any history of kidney or liver function issues.  Last GFR was March 2023 and was 73.  Medication review completed with patient.  Interactions with trazodone, Lipitor, and Remeron (takes for sleep only).  Patient instructed to stop these medications for 5 days while on treatment as well as for 5 days after completion. Continue over-the-counter Tylenol or ibuprofen as needed as well as cough medicine as needed Patient to follow-up with PCP in 2 days for recheck Strict ER precautions reviewed and patient verbalized understanding Final Clinical Impressions(s) / UC Diagnoses   Final diagnoses:  Acute cough  COVID-19     Discharge Instructions      Start Paxlovid twice daily for 5 days.  Paxlovid interacts with multiple medications.  Stop your atorvastatin, your trazodone, and mirtazapine  5 days while you are on treatment as well as for 5 days once you complete treatment.   If this occurs please contact your PCP as soon as possible Use over-the-counter cough medicine, Tylenol or ibuprofen as needed Rest and fluids Follow-up with your PCP in 2 days for recheck  Please go to the ER soon as possible for any worsening symptoms     ED Prescriptions     Medication Sig Dispense Auth. Provider   nirmatrelvir & ritonavir (PAXLOVID, 300/100,) 20 x 150 MG & 10 x 100MG  TBPK Take 3 tablets by mouth 2 (two) times daily for 5 days. 30 tablet Melynda Ripple, NP      PDMP not reviewed this encounter.   Melynda Ripple, NP 08/05/22 [phone]

## 2022-08-05 NOTE — ED Triage Notes (Signed)
Pt states he started feeling badly yesterday with cold/flu like symptoms of congestion, mild cough, sore throat and headache. Took a COVID today and was positive.

## 2022-08-05 NOTE — Discharge Instructions (Addendum)
Start Paxlovid twice daily for 5 days.  Paxlovid interacts with multiple medications.  Stop your atorvastatin, your trazodone, and mirtazapine  5 days while you are on treatment as well as for 5 days once you complete treatment.   If this occurs please contact your PCP as soon as possible Use over-the-counter cough medicine, Tylenol or ibuprofen as needed Rest and fluids Follow-up with your PCP in 2 days for recheck Please go to the ER soon as possible for any worsening symptoms

## 2022-08-07 ENCOUNTER — Ambulatory Visit (INDEPENDENT_AMBULATORY_CARE_PROVIDER_SITE_OTHER): Payer: Medicare HMO

## 2022-08-07 VITALS — Wt 175.0 lb

## 2022-08-07 DIAGNOSIS — Z0000 Encounter for general adult medical examination without abnormal findings: Secondary | ICD-10-CM

## 2022-08-07 NOTE — Patient Instructions (Signed)
Mr. Goggins , Thank you for taking time to come for your Medicare Wellness Visit. I appreciate your ongoing commitment to your health goals. Please review the following plan we discussed and let me know if I can assist you in the future.   These are the goals we discussed:  Goals      Patient Stated     None at this time         This is a list of the screening recommended for you and due dates:  Health Maintenance  Topic Date Due   Hepatitis C Screening: USPSTF Recommendation to screen - Ages 66-79 yo.  Never done   Zoster (Shingles) Vaccine (1 of 2) Never done   Eye exam for diabetics  02/09/2021   Pneumonia Vaccine (3 of 3 - PPSV23 or PCV20) 05/30/2021   COVID-19 Vaccine (4 - 2023-24 season) 01/13/2022   Yearly kidney function blood test for diabetes  07/30/2022   Yearly kidney health urinalysis for diabetes  08/04/2022   Flu Shot  08/13/2022*   Hemoglobin A1C  11/09/2022   Medicare Annual Wellness Visit  08/07/2023   Colon Cancer Screening  09/29/2026   DTaP/Tdap/Td vaccine (4 - Td or Tdap) 05/28/2030   HPV Vaccine  Aged Out  *Topic was postponed. The date shown is not the original due date.    Advanced directives: Please bring a copy of your health care power of attorney and living will to the office at your convenience.  Conditions/risks identified: none at this time   Next appointment: Follow up in one year for your annual wellness visit.   Preventive Care 33 Years and Older, Male  Preventive care refers to lifestyle choices and visits with your health care provider that can promote health and wellness. What does preventive care include? A yearly physical exam. This is also called an annual well check. Dental exams once or twice a year. Routine eye exams. Ask your health care provider how often you should have your eyes checked. Personal lifestyle choices, including: Daily care of your teeth and gums. Regular physical activity. Eating a healthy diet. Avoiding  tobacco and drug use. Limiting alcohol use. Practicing safe sex. Taking low doses of aspirin every day. Taking vitamin and mineral supplements as recommended by your health care provider. What happens during an annual well check? The services and screenings done by your health care provider during your annual well check will depend on your age, overall health, lifestyle risk factors, and family history of disease. Counseling  Your health care provider may ask you questions about your: Alcohol use. Tobacco use. Drug use. Emotional well-being. Home and relationship well-being. Sexual activity. Eating habits. History of falls. Memory and ability to understand (cognition). Work and work Statistician. Screening  You may have the following tests or measurements: Height, weight, and BMI. Blood pressure. Lipid and cholesterol levels. These may be checked every 5 years, or more frequently if you are over 72 years old. Skin check. Lung cancer screening. You may have this screening every year starting at age 72 if you have a 30-pack-year history of smoking and currently smoke or have quit within the past 15 years. Fecal occult blood test (FOBT) of the stool. You may have this test every year starting at age 72. Flexible sigmoidoscopy or colonoscopy. You may have a sigmoidoscopy every 5 years or a colonoscopy every 10 years starting at age 72. Prostate cancer screening. Recommendations will vary depending on your family history and other risks. Hepatitis C  blood test. Hepatitis B blood test. Sexually transmitted disease (STD) testing. Diabetes screening. This is done by checking your blood sugar (glucose) after you have not eaten for a while (fasting). You may have this done every 1-3 years. Abdominal aortic aneurysm (AAA) screening. You may need this if you are a current or former smoker. Osteoporosis. You may be screened starting at age 72 if you are at high risk. Talk with your health care  provider about your test results, treatment options, and if necessary, the need for more tests. Vaccines  Your health care provider may recommend certain vaccines, such as: Influenza vaccine. This is recommended every year. Tetanus, diphtheria, and acellular pertussis (Tdap, Td) vaccine. You may need a Td booster every 10 years. Zoster vaccine. You may need this after age 65. Pneumococcal 13-valent conjugate (PCV13) vaccine. One dose is recommended after age 72. Pneumococcal polysaccharide (PPSV23) vaccine. One dose is recommended after age 72. Talk to your health care provider about which screenings and vaccines you need and how often you need them. This information is not intended to replace advice given to you by your health care provider. Make sure you discuss any questions you have with your health care provider. Document Released: 05/28/2015 Document Revised: 01/19/2016 Document Reviewed: 03/02/2015 Elsevier Interactive Patient Education  2017 [place] Prevention in the Home Falls can cause injuries. They can happen to people of all ages. There are many things you can do to make your home safe and to help prevent falls. What can I do on the outside of my home? Regularly fix the edges of walkways and driveways and fix any cracks. Remove anything that might make you trip as you walk through a door, such as a raised step or threshold. Trim any bushes or trees on the path to your home. Use bright outdoor lighting. Clear any walking paths of anything that might make someone trip, such as rocks or tools. Regularly check to see if handrails are loose or broken. Make sure that both sides of any steps have handrails. Any raised decks and porches should have guardrails on the edges. Have any leaves, snow, or ice cleared regularly. Use sand or salt on walking paths during winter. Clean up any spills in your garage right away. This includes oil or grease spills. What can I do in the  bathroom? Use night lights. Install grab bars by the toilet and in the tub and shower. Do not use towel bars as grab bars. Use non-skid mats or decals in the tub or shower. If you need to sit down in the shower, use a plastic, non-slip stool. Keep the floor dry. Clean up any water that spills on the floor as soon as it happens. Remove soap buildup in the tub or shower regularly. Attach bath mats securely with double-sided non-slip rug tape. Do not have throw rugs and other things on the floor that can make you trip. What can I do in the bedroom? Use night lights. Make sure that you have a light by your bed that is easy to reach. Do not use any sheets or blankets that are too big for your bed. They should not hang down onto the floor. Have a firm chair that has side arms. You can use this for support while you get dressed. Do not have throw rugs and other things on the floor that can make you trip. What can I do in the kitchen? Clean up any spills right away. Avoid walking on  wet floors. Keep items that you use a lot in easy-to-reach places. If you need to reach something above you, use a strong step stool that has a grab bar. Keep electrical cords out of the way. Do not use floor polish or wax that makes floors slippery. If you must use wax, use non-skid floor wax. Do not have throw rugs and other things on the floor that can make you trip. What can I do with my stairs? Do not leave any items on the stairs. Make sure that there are handrails on both sides of the stairs and use them. Fix handrails that are broken or loose. Make sure that handrails are as long as the stairways. Check any carpeting to make sure that it is firmly attached to the stairs. Fix any carpet that is loose or worn. Avoid having throw rugs at the top or bottom of the stairs. If you do have throw rugs, attach them to the floor with carpet tape. Make sure that you have a light switch at the top of the stairs and the  bottom of the stairs. If you do not have them, ask someone to add them for you. What else can I do to help prevent falls? Wear shoes that: Do not have high heels. Have rubber bottoms. Are comfortable and fit you well. Are closed at the toe. Do not wear sandals. If you use a stepladder: Make sure that it is fully opened. Do not climb a closed stepladder. Make sure that both sides of the stepladder are locked into place. Ask someone to hold it for you, if possible. Clearly mark and make sure that you can see: Any grab bars or handrails. First and last steps. Where the edge of each step is. Use tools that help you move around (mobility aids) if they are needed. These include: Canes. Walkers. Scooters. Crutches. Turn on the lights when you go into a dark area. Replace any light bulbs as soon as they burn out. Set up your furniture so you have a clear path. Avoid moving your furniture around. If any of your floors are uneven, fix them. If there are any pets around you, be aware of where they are. Review your medicines with your doctor. Some medicines can make you feel dizzy. This can increase your chance of falling. Ask your doctor what other things that you can do to help prevent falls. This information is not intended to replace advice given to you by your health care provider. Make sure you discuss any questions you have with your health care provider. Document Released: 02/25/2009 Document Revised: 10/07/2015 Document Reviewed: 06/05/2014 Elsevier Interactive Patient Education  2017 [employer].

## 2022-08-07 NOTE — Progress Notes (Signed)
Subjective:   Billy Hardin is a 72 y.o. male who presents for Medicare Annual/Subsequent preventive examination.  Review of Systems     Cardiac Risk Factors include: advanced age (>79men, >52 women);hypertension;male gender;diabetes mellitus;dyslipidemia;sedentary lifestyle     Objective:    Today's Vitals   08/07/22 1309  Weight: 175 lb (79.4 kg)  PainSc: 0-No pain   Body mass index is 23.73 kg/m.     08/07/2022    1:13 PM 08/05/2022   10:49 AM 02/03/2022   10:51 AM 05/28/2020    9:32 AM 05/02/2019    6:28 AM 07/25/2018   11:33 AM 05/26/2015    9:16 AM  Advanced Directives  Does Patient Have a Medical Advance Directive? Yes Yes Yes No No No No  Type of Paramedic of [place];Living will Living will;Healthcare Power of [place];Living will      Copy of [place] in Chart? No - copy requested        Would patient like information on creating a medical advance directive?     No - Patient declined No - Patient declined No - patient declined information    Current Medications (verified) Outpatient Encounter Medications as of 08/07/2022  Medication Sig   atorvastatin (LIPITOR) 20 MG tablet Take 1 tablet by mouth once daily   baclofen (LIORESAL) 10 MG tablet Take 1 tablet (10 mg total) by mouth 3 (three) times daily.   blood glucose meter kit and supplies KIT Dispense based on patient and insurance preference. Check sugar daily   cholecalciferol (VITAMIN D) 400 units TABS tablet Take 400 Units by mouth.   CINNAMON PO Take 2 capsules by mouth daily.   ibuprofen (ADVIL) 600 MG tablet Take 1 tablet (600 mg total) by mouth every 6 (six) hours as needed.   meloxicam (MOBIC) 7.5 MG tablet Take 1-2 tablets (7.5-15 mg total) by mouth daily. For arthritis in hip/knee   metFORMIN (GLUCOPHAGE) 1000 MG tablet TAKE 1 TABLET BY MOUTH TWICE DAILY WITH A MEAL   metoprolol succinate (TOPROL-XL) 50 MG 24 hr tablet TAKE 1  TABLET BY MOUTH ONCE DAILY WITH  OR  IMMEDIATELY  FOLLOWING  A  MEAL   mirtazapine (REMERON) 30 MG tablet TAKE 1 TABLET BY MOUTH AT BEDTIME   nirmatrelvir & ritonavir (PAXLOVID, 300/100,) 20 x 150 MG & 10 x 100MG  TBPK Take 3 tablets by mouth 2 (two) times daily for 5 days.   Omega-3 Fatty Acids (OMEGA 3 500 PO) Take by mouth.   omeprazole (PRILOSEC) 40 MG capsule TAKE 1 CAPSULE BY MOUTH ONCE DAILY AS NEEDED FOR HEART   ONE TOUCH ULTRA TEST test strip CHECK BLOOD SUGAR DAILY   ONETOUCH DELICA LANCETS 99991111 MISC TEST BLOOD SUGAR DAILY   traZODone (DESYREL) 50 MG tablet TAKE 1/2 TO 1 (ONE-HALF TO ONE) TABLET BY MOUTH AT BEDTIME AS NEEDED FOR SLEEP   valACYclovir (VALTREX) 1000 MG tablet Take 1 tablet by mouth once daily   vitamin B-12 (CYANOCOBALAMIN) 1000 MCG tablet Take 1,000 mcg by mouth daily.   vitamin E 400 UNIT capsule Take 400 Units by mouth 2 (two) times daily.   No facility-administered encounter medications on file as of 08/07/2022.    Allergies (verified) Bupropion, Sertraline, and Penicillins   History: Past Medical History:  Diagnosis Date   Chronic migraine without aura without status migrainosus, not intractable [id number]   Complication of anesthesia    had catheter 4 days unable to void  Deaf    Rt ear   Diabetes mellitus without complication ([place])    Elevated lipids    Herpes    Insomnia    Seasonal allergies    TMJ (dislocation of temporomandibular joint)    Uses hearing aid    Left   Past Surgical History:  Procedure Laterality Date   KIDNEY STONE SURGERY     MIDDLE EAR SURGERY     PENILE PROSTHESIS IMPLANT N/A 11/03/2014   Procedure: PENILE PROTHESIS INFLATABLE;  Surgeon: Royston Cowper, MD;  Location: [facility] ORS;  Service: Urology;  Laterality: N/A;   TONSILLECTOMY AND ADENOIDECTOMY     Family History  Problem Relation Age of Onset   Cancer Mother    CAD Brother    Social History   Socioeconomic History   Marital status: Married    Spouse name: Not  on file   Number of children: Not on file   Years of education: Not on file   Highest education level: Not on file  Occupational History   Not on file  Tobacco Use   Smoking status: Never   Smokeless tobacco: Never  Vaping Use   Vaping Use: Never used  Substance and Sexual Activity   Alcohol use: Yes    Comment: 1-2 drinks total of 2-3x a year   Drug use: No   Sexual activity: Yes    Partners: Female  Other Topics Concern   Not on file  Social History Narrative   Not on file   Social Determinants of Health   Financial Resource Strain: Low Risk  (08/07/2022)   Overall Financial Resource Strain (CARDIA)    Difficulty of Paying Living Expenses: Not hard at all  Food Insecurity: No Food Insecurity (08/07/2022)   Hunger Vital Sign    Worried About Running Out of Food in the Last Year: Never true    [place] in the Last Year: Never true  Transportation Needs: No Transportation Needs (08/07/2022)   PRAPARE - Hydrologist (Medical): No    Lack of Transportation (Non-Medical): No  Physical Activity: Insufficiently Active (08/07/2022)   Exercise Vital Sign    Days of Exercise per Week: 2 days    Minutes of Exercise per Session: 30 min  Stress: No Stress Concern Present (08/07/2022)   [place]    Feeling of Stress : Not at all  Social Connections: Socially Isolated (08/07/2022)   Social Connection and Isolation Panel [NHANES]    Frequency of Communication with Friends and Family: Once a week    Frequency of Social Gatherings with Friends and Family: Once a week    Attends Religious Services: Never    Marine scientist or Organizations: No    Attends Music therapist: Never    Marital Status: Married    Tobacco Counseling Counseling given: Not Answered   Clinical Intake:  Pre-visit preparation completed: Yes  Pain : No/denies pain Pain Score: 0-No  pain     BMI - recorded: 23.73 Nutritional Status: BMI of 19-24  Normal Nutritional Risks: None Diabetes: Yes CBG done?: No Did pt. bring in CBG monitor from home?: No  How often do you need to have someone help you when you read instructions, pamphlets, or other written materials from your doctor or pharmacy?: 1 - Never  Diabetic?Nutrition Risk Assessment:  Has the patient had any N/V/D within the last 2 months?  No  Does  the patient have any non-healing wounds?  No  Has the patient had any unintentional weight loss or weight gain?  No   Diabetes:  Is the patient diabetic?  Yes  If diabetic, was a CBG obtained today?  No  Did the patient bring in their glucometer from home?  No  How often do you monitor your CBG's? As needed .   Financial Strains and Diabetes Management:  Are you having any financial strains with the device, your supplies or your medication? No .  Does the patient want to be seen by Chronic Care Management for management of their diabetes?  No  Would the patient like to be referred to a Nutritionist or for Diabetic Management?  No   Diabetic Exams:  Diabetic Eye Exam: Overdue for diabetic eye exam. Pt has been advised about the importance in completing this exam. Patient advised to call and schedule an eye exam. Not a candidate for foot exams    Interpreter Needed?: No  Information entered by :: Charlott Rakes, LPN   Activities of Daily Living    08/07/2022    1:14 PM  In your present state of health, do you have any difficulty performing the following activities:  Hearing? 1  Comment 1 hearing aid  Vision? 0  Difficulty concentrating or making decisions? 0  Walking or climbing stairs? 0  Dressing or bathing? 0  Doing errands, shopping? 0  Preparing Food and eating ? N  Using the Toilet? N  In the past six months, have you accidently leaked urine? N  Do you have problems with loss of bowel control? N  Managing your Medications? N  Managing  your Finances? N  Housekeeping or managing your Housekeeping? N    Patient Care Team: Birdie Sons, MD as PCP - General (Family Medicine) [facility], [place], [place]  Indicate any recent Medical Services you may have received from other than [facility] providers in the past year (date may be approximate).     Assessment:   This is a routine wellness examination for [place].  Hearing/Vision screen Hearing Screening - Comments:: Pt has 1 hearing aids  Vision Screening - Comments:: [facility] for annual [facility] exams   Dietary issues and exercise activities discussed: Current Exercise Habits: Home exercise routine, Type of exercise: Other - see comments (staionary bike), Time (Minutes): 20, Frequency (Times/Week): 3, Weekly Exercise (Minutes/Week): 60   Goals Addressed             This Visit's Progress    Patient Stated       None at this time        Depression Screen    08/07/2022    1:13 PM 03/07/2021    8:43 AM 10/29/2020    8:48 AM 07/27/2020    9:11 AM 04/12/2020    1:13 PM 01/29/2020    8:21 AM 11/06/2019   10:34 AM  PHQ 2/9 Scores  PHQ - 2 Score 0 0 0 0 0 0 0  PHQ- 9 Score  1 0 3 3 4      Fall Risk    08/07/2022    1:14 PM 07/17/2021    4:00 PM 03/07/2021    8:43 AM 10/29/2020    8:49 AM 07/27/2020    9:11 AM  Fall Risk   Falls in the past year? 0 0 0 0 0  Number falls in past yr: 0  0 0 0  Injury with Fall? 0  0 0 0 0  Risk for fall due to : Impaired vision  No Fall Risks    Follow up Falls prevention discussed  Falls evaluation completed Falls evaluation completed     FALL RISK PREVENTION PERTAINING TO THE HOME:  Any stairs in or around the home? No  If so, are there any without handrails? No  Home free of loose throw rugs in walkways, pet beds, electrical cords, etc? Yes  Adequate lighting in your home to reduce risk of falls? Yes   ASSISTIVE DEVICES UTILIZED TO PREVENT FALLS:  Life alert? No  Use of a cane, walker or  w/c? No  Grab bars in the bathroom? Yes  Shower chair or bench in shower? no Elevated toilet seat or a handicapped toilet? No   TIMED UP AND GO:  Was the test performed? No .    Cognitive Function:        08/07/2022    1:15 PM  6CIT Screen  What Year? 0 points  What month? 0 points  What time? 0 points  Count back from 20 0 points  Months in reverse 0 points  Repeat phrase 0 points  Total Score 0 points    Immunizations Immunization History  Administered Date(s) Administered   Fluad Quad(high Dose 65+) 02/05/2019, 01/29/2020, 03/07/2021   Influenza-Unspecified 02/13/2015, 04/01/2016, 03/16/2017   PFIZER(Purple Top)SARS-COV-2 Vaccination 07/30/2019, 08/20/2019, 05/31/2020   Pneumococcal Conjugate-13 05/30/2016   Pneumococcal Polysaccharide-23 05/26/2015   Td 06/04/2017   Tdap 01/16/2007, 05/28/2020   Zoster, Live 06/04/2012    TDAP status: Up to date  Flu Vaccine status: Due, Education has been provided regarding the importance of this vaccine. Advised may receive this vaccine at local pharmacy or Health Dept. Aware to provide a copy of the vaccination record if obtained from local pharmacy or Health Dept. Verbalized acceptance and understanding.  Pneumococcal vaccine status: Up to date  Covid-19 vaccine status: Declined, Education has been provided regarding the importance of this vaccine but patient still declined. Advised may receive this vaccine at local pharmacy or Health Dept.or vaccine clinic. Aware to provide a copy of the vaccination record if obtained from local pharmacy or Health Dept. Verbalized acceptance and understanding.  Qualifies for Shingles Vaccine? Yes   Zostavax completed No   Shingrix Completed?: No.    Education has been provided regarding the importance of this vaccine. Patient has been advised to call insurance company to determine out of pocket expense if they have not yet received this vaccine. Advised may also receive vaccine at local  pharmacy or Health Dept. Verbalized acceptance and understanding.  Screening Tests Health Maintenance  Topic Date Due   Hepatitis C Screening  Never done   Zoster Vaccines- Shingrix (1 of 2) Never done   OPHTHALMOLOGY EXAM  02/09/2021   Pneumonia Vaccine 47+ Years old (3 of 3 - PPSV23 or PCV20) 05/30/2021   COVID-19 Vaccine (4 - 2023-24 season) 01/13/2022   Diabetic kidney evaluation - eGFR measurement  07/30/2022   Diabetic kidney evaluation - Urine ACR  08/04/2022   INFLUENZA VACCINE  08/13/2022 (Originally 12/13/2021)   HEMOGLOBIN A1C  11/09/2022   Medicare Annual Wellness (AWV)  08/07/2023   COLONOSCOPY (Pts 45-69yrs Insurance coverage will need to be confirmed)  09/29/2026   DTaP/Tdap/Td (4 - Td or Tdap) 05/28/2030   HPV VACCINES  Aged Out    Health Maintenance  Health Maintenance Due  Topic Date Due   Hepatitis C Screening  Never done   Zoster Vaccines- Shingrix (1  of 2) Never done   OPHTHALMOLOGY EXAM  02/09/2021   Pneumonia Vaccine 50+ Years old (3 of 3 - PPSV23 or PCV20) 05/30/2021   COVID-19 Vaccine (4 - 2023-24 season) 01/13/2022   Diabetic kidney evaluation - eGFR measurement  07/30/2022   Diabetic kidney evaluation - Urine ACR  08/04/2022    Colorectal cancer screening: Type of screening: Colonoscopy. Completed 09/28/16. Repeat every 10 years   Additional Screening:  Hepatitis C Screening: does qualify;  Vision Screening: Recommended annual ophthalmology exams for early detection of glaucoma and other disorders of the eye. Is the patient up to date with their annual eye exam?  No  Who is the provider or what is the name of the office in which the patient attends annual eye exams? [facility]  If pt is not established with a provider, would they like to be referred to a provider to establish care? No .   Dental Screening: Recommended annual dental exams for proper oral hygiene  Community Resource Referral / Chronic Care Management: CRR required this visit?   No   CCM required this visit?  No      Plan:     I have personally reviewed and noted the following in the patient's chart:   Medical and social history Use of alcohol, tobacco or illicit drugs  Current medications and supplements including opioid prescriptions. Patient is not currently taking opioid prescriptions. Functional ability and status Nutritional status Physical activity Advanced directives List of other physicians Hospitalizations, surgeries, and ER visits in previous 12 months Vitals Screenings to include cognitive, depression, and falls Referrals and appointments  In addition, I have reviewed and discussed with patient certain preventive protocols, quality metrics, and best practice recommendations. A written personalized care plan for preventive services as well as general preventive health recommendations were provided to patient.     Willette Brace, LPN   [id number]   Nurse Notes: none

## 2022-08-08 ENCOUNTER — Encounter: Payer: Medicare HMO | Admitting: Family Medicine

## 2022-08-10 NOTE — Progress Notes (Signed)
I connected with  [name] on 08/10/22 by a audio enabled telemedicine application and verified that I am speaking with the correct person using two identifiers.  Patient Location: Home  Provider Location: Office/Clinic  I discussed the limitations of evaluation and management by telemedicine. The patient expressed understanding and agreed to proceed.   Subjective:   Billy Hardin is a 72 y.o. male who presents for Medicare Annual/Subsequent preventive examination.  Review of Systems     Cardiac Risk Factors include: advanced age (>79men, >1 women);hypertension;male gender;diabetes mellitus;dyslipidemia;sedentary lifestyle     Objective:    Today's Vitals   08/07/22 1309  Weight: 175 lb (79.4 kg)  PainSc: 0-No pain   Body mass index is 23.73 kg/m.     08/07/2022    1:13 PM 08/05/2022   10:49 AM 02/03/2022   10:51 AM 05/28/2020    9:32 AM 05/02/2019    6:28 AM 07/25/2018   11:33 AM 05/26/2015    9:16 AM  Advanced Directives  Does Patient Have a Medical Advance Directive? Yes Yes Yes No No No No  Type of Paramedic of [place];Living will Living will;Healthcare Power of [place];Living will      Copy of [place] in Chart? No - copy requested        Would patient like information on creating a medical advance directive?     No - Patient declined No - Patient declined No - patient declined information    Current Medications (verified) Outpatient Encounter Medications as of 08/07/2022  Medication Sig   atorvastatin (LIPITOR) 20 MG tablet Take 1 tablet by mouth once daily   baclofen (LIORESAL) 10 MG tablet Take 1 tablet (10 mg total) by mouth 3 (three) times daily.   blood glucose meter kit and supplies KIT Dispense based on patient and insurance preference. Check sugar daily   cholecalciferol (VITAMIN D) 400 units TABS tablet Take 400 Units by mouth.   CINNAMON PO Take 2 capsules by mouth daily.    ibuprofen (ADVIL) 600 MG tablet Take 1 tablet (600 mg total) by mouth every 6 (six) hours as needed.   meloxicam (MOBIC) 7.5 MG tablet Take 1-2 tablets (7.5-15 mg total) by mouth daily. For arthritis in hip/knee   metFORMIN (GLUCOPHAGE) 1000 MG tablet TAKE 1 TABLET BY MOUTH TWICE DAILY WITH A MEAL   metoprolol succinate (TOPROL-XL) 50 MG 24 hr tablet TAKE 1 TABLET BY MOUTH ONCE DAILY WITH  OR  IMMEDIATELY  FOLLOWING  A  MEAL   mirtazapine (REMERON) 30 MG tablet TAKE 1 TABLET BY MOUTH AT BEDTIME   nirmatrelvir & ritonavir (PAXLOVID, 300/100,) 20 x 150 MG & 10 x 100MG  TBPK Take 3 tablets by mouth 2 (two) times daily for 5 days.   Omega-3 Fatty Acids (OMEGA 3 500 PO) Take by mouth.   omeprazole (PRILOSEC) 40 MG capsule TAKE 1 CAPSULE BY MOUTH ONCE DAILY AS NEEDED FOR HEART   ONE TOUCH ULTRA TEST test strip CHECK BLOOD SUGAR DAILY   ONETOUCH DELICA LANCETS 99991111 MISC TEST BLOOD SUGAR DAILY   traZODone (DESYREL) 50 MG tablet TAKE 1/2 TO 1 (ONE-HALF TO ONE) TABLET BY MOUTH AT BEDTIME AS NEEDED FOR SLEEP   valACYclovir (VALTREX) 1000 MG tablet Take 1 tablet by mouth once daily   vitamin B-12 (CYANOCOBALAMIN) 1000 MCG tablet Take 1,000 mcg by mouth daily.   vitamin E 400 UNIT capsule Take 400 Units by mouth 2 (two) times daily.  No facility-administered encounter medications on file as of 08/07/2022.    Allergies (verified) Bupropion, Sertraline, and Penicillins   History: Past Medical History:  Diagnosis Date   Chronic migraine without aura without status migrainosus, not intractable [id number]   Complication of anesthesia    had catheter 4 days unable to void   Deaf    Rt ear   Diabetes mellitus without complication ([place])    Elevated lipids    Herpes    Insomnia    Seasonal allergies    TMJ (dislocation of temporomandibular joint)    Uses hearing aid    Left   Past Surgical History:  Procedure Laterality Date   KIDNEY STONE SURGERY     MIDDLE EAR SURGERY     PENILE PROSTHESIS  IMPLANT N/A 11/03/2014   Procedure: PENILE PROTHESIS INFLATABLE;  Surgeon: Royston Cowper, MD;  Location: [facility] ORS;  Service: Urology;  Laterality: N/A;   TONSILLECTOMY AND ADENOIDECTOMY     Family History  Problem Relation Age of Onset   Cancer Mother    CAD Brother    Social History   Socioeconomic History   Marital status: Married    Spouse name: Not on file   Number of children: Not on file   Years of education: Not on file   Highest education level: Not on file  Occupational History   Not on file  Tobacco Use   Smoking status: Never   Smokeless tobacco: Never  Vaping Use   Vaping Use: Never used  Substance and Sexual Activity   Alcohol use: Yes    Comment: 1-2 drinks total of 2-3x a year   Drug use: No   Sexual activity: Yes    Partners: Female  Other Topics Concern   Not on file  Social History Narrative   Not on file   Social Determinants of Health   Financial Resource Strain: Low Risk  (08/07/2022)   Overall Financial Resource Strain (CARDIA)    Difficulty of Paying Living Expenses: Not hard at all  Food Insecurity: No Food Insecurity (08/07/2022)   Hunger Vital Sign    Worried About Running Out of Food in the Last Year: Never true    [place] in the Last Year: Never true  Transportation Needs: No Transportation Needs (08/07/2022)   PRAPARE - Hydrologist (Medical): No    Lack of Transportation (Non-Medical): No  Physical Activity: Insufficiently Active (08/07/2022)   Exercise Vital Sign    Days of Exercise per Week: 2 days    Minutes of Exercise per Session: 30 min  Stress: No Stress Concern Present (08/07/2022)   [place]    Feeling of Stress : Not at all  Social Connections: Socially Isolated (08/07/2022)   Social Connection and Isolation Panel [NHANES]    Frequency of Communication with Friends and Family: Once a week    Frequency of Social Gatherings  with Friends and Family: Once a week    Attends Religious Services: Never    Marine scientist or Organizations: No    Attends Music therapist: Never    Marital Status: Married    Tobacco Counseling Counseling given: Not Answered   Clinical Intake:  Pre-visit preparation completed: Yes  Pain : No/denies pain Pain Score: 0-No pain     BMI - recorded: 23.73 Nutritional Status: BMI of 19-24  Normal Nutritional Risks: None Diabetes: Yes CBG done?: No Did  pt. bring in CBG monitor from home?: No  How often do you need to have someone help you when you read instructions, pamphlets, or other written materials from your doctor or pharmacy?: 1 - Never  Diabetic?Nutrition Risk Assessment:  Has the patient had any N/V/D within the last 2 months?  No  Does the patient have any non-healing wounds?  No  Has the patient had any unintentional weight loss or weight gain?  No   Diabetes:  Is the patient diabetic?  Yes  If diabetic, was a CBG obtained today?  No  Did the patient bring in their glucometer from home?  No  How often do you monitor your CBG's? As needed .   Financial Strains and Diabetes Management:  Are you having any financial strains with the device, your supplies or your medication? No .  Does the patient want to be seen by Chronic Care Management for management of their diabetes?  No  Would the patient like to be referred to a Nutritionist or for Diabetic Management?  No   Diabetic Exams:  Diabetic Eye Exam: Overdue for diabetic eye exam. Pt has been advised about the importance in completing this exam. Patient advised to call and schedule an eye exam. Not a candidate for foot exams    Interpreter Needed?: No  Information entered by :: Charlott Rakes, LPN   Activities of Daily Living    08/07/2022    1:14 PM  In your present state of health, do you have any difficulty performing the following activities:  Hearing? 1  Comment 1 hearing  aid  Vision? 0  Difficulty concentrating or making decisions? 0  Walking or climbing stairs? 0  Dressing or bathing? 0  Doing errands, shopping? 0  Preparing Food and eating ? N  Using the Toilet? N  In the past six months, have you accidently leaked urine? N  Do you have problems with loss of bowel control? N  Managing your Medications? N  Managing your Finances? N  Housekeeping or managing your Housekeeping? N    Patient Care Team: Birdie Sons, MD as PCP - General (Family Medicine) [facility], [place], [place]  Indicate any recent Medical Services you may have received from other than [facility] providers in the past year (date may be approximate).     Assessment:   This is a routine wellness examination for J[place].  Hearing/Vision screen Hearing Screening - Comments:: Pt has 1 hearing aids  Vision Screening - Comments:: [facility] for annual [facility] exams   Dietary issues and exercise activities discussed: Current Exercise Habits: Home exercise routine, Type of exercise: Other - see comments (staionary bike), Time (Minutes): 20, Frequency (Times/Week): 3, Weekly Exercise (Minutes/Week): 60   Goals Addressed             This Visit's Progress    Patient Stated       None at this time       Depression Screen    08/07/2022    1:13 PM 03/07/2021    8:43 AM 10/29/2020    8:48 AM 07/27/2020    9:11 AM 04/12/2020    1:13 PM 01/29/2020    8:21 AM 11/06/2019   10:34 AM  PHQ 2/9 Scores  PHQ - 2 Score 0 0 0 0 0 0 0  PHQ- 9 Score  1 0 3 3 4      Fall Risk    08/07/2022    1:14 PM  07/17/2021    4:00 PM 03/07/2021    8:43 AM 10/29/2020    8:49 AM 07/27/2020    9:11 AM  Fall Risk   Falls in the past year? 0 0 0 0 0  Number falls in past yr: 0  0 0 0  Injury with Fall? 0 0 0 0 0  Risk for fall due to : Impaired vision  No Fall Risks    Follow up Falls prevention discussed  Falls evaluation completed Falls evaluation completed     FALL  RISK PREVENTION PERTAINING TO THE HOME:  Any stairs in or around the home? No  If so, are there any without handrails? No  Home free of loose throw rugs in walkways, pet beds, electrical cords, etc? Yes  Adequate lighting in your home to reduce risk of falls? Yes   ASSISTIVE DEVICES UTILIZED TO PREVENT FALLS:  Life alert? No  Use of a cane, walker or w/c? No  Grab bars in the bathroom? Yes  Shower chair or bench in shower? no Elevated toilet seat or a handicapped toilet? No   TIMED UP AND GO:  Was the test performed? No .    Cognitive Function:        08/07/2022    1:15 PM  6CIT Screen  What Year? 0 points  What month? 0 points  What time? 0 points  Count back from 20 0 points  Months in reverse 0 points  Repeat phrase 0 points  Total Score 0 points    Immunizations Immunization History  Administered Date(s) Administered   Fluad Quad(high Dose 65+) 02/05/2019, 01/29/2020, 03/07/2021   Influenza-Unspecified 02/13/2015, 04/01/2016, 03/16/2017   PFIZER(Purple Top)SARS-COV-2 Vaccination 07/30/2019, 08/20/2019, 05/31/2020   Pneumococcal Conjugate-13 05/30/2016   Pneumococcal Polysaccharide-23 05/26/2015   Td 06/04/2017   Tdap 01/16/2007, 05/28/2020   Zoster, Live 06/04/2012    TDAP status: Up to date  Flu Vaccine status: Due, Education has been provided regarding the importance of this vaccine. Advised may receive this vaccine at local pharmacy or Health Dept. Aware to provide a copy of the vaccination record if obtained from local pharmacy or Health Dept. Verbalized acceptance and understanding.  Pneumococcal vaccine status: Up to date  Covid-19 vaccine status: Declined, Education has been provided regarding the importance of this vaccine but patient still declined. Advised may receive this vaccine at local pharmacy or Health Dept.or vaccine clinic. Aware to provide a copy of the vaccination record if obtained from local pharmacy or Health Dept. Verbalized acceptance  and understanding.  Qualifies for Shingles Vaccine? Yes   Zostavax completed No   Shingrix Completed?: No.    Education has been provided regarding the importance of this vaccine. Patient has been advised to call insurance company to determine out of pocket expense if they have not yet received this vaccine. Advised may also receive vaccine at local pharmacy or Health Dept. Verbalized acceptance and understanding.  Screening Tests Health Maintenance  Topic Date Due   Hepatitis C Screening  Never done   Zoster Vaccines- Shingrix (1 of 2) Never done   OPHTHALMOLOGY EXAM  02/09/2021   Pneumonia Vaccine 5+ Years old (3 of 3 - PPSV23 or PCV20) 05/30/2021   COVID-19 Vaccine (4 - 2023-24 season) 01/13/2022   Diabetic kidney evaluation - eGFR measurement  07/30/2022   Diabetic kidney evaluation - Urine ACR  08/04/2022   INFLUENZA VACCINE  08/13/2022 (Originally 12/13/2021)   HEMOGLOBIN A1C  11/09/2022   Medicare Annual Wellness (AWV)  08/07/2023  COLONOSCOPY (Pts 45-62yrs Insurance coverage will need to be confirmed)  09/29/2026   DTaP/Tdap/Td (4 - Td or Tdap) 05/28/2030   HPV VACCINES  Aged Out    Health Maintenance  Health Maintenance Due  Topic Date Due   Hepatitis C Screening  Never done   Zoster Vaccines- Shingrix (1 of 2) Never done   OPHTHALMOLOGY EXAM  02/09/2021   Pneumonia Vaccine 62+ Years old (3 of 3 - PPSV23 or PCV20) 05/30/2021   COVID-19 Vaccine (4 - 2023-24 season) 01/13/2022   Diabetic kidney evaluation - eGFR measurement  07/30/2022   Diabetic kidney evaluation - Urine ACR  08/04/2022    Colorectal cancer screening: Type of screening: Colonoscopy. Completed 09/28/16. Repeat every 10 years   Additional Screening:  Hepatitis C Screening: does qualify;  Vision Screening: Recommended annual ophthalmology exams for early detection of glaucoma and other disorders of the eye. Is the patient up to date with their annual eye exam?  No  Who is the provider or what is the  name of the office in which the patient attends annual eye exams? [facility]  If pt is not established with a provider, would they like to be referred to a provider to establish care? No .   Dental Screening: Recommended annual dental exams for proper oral hygiene  Community Resource Referral / Chronic Care Management: CRR required this visit?  No   CCM required this visit?  No      Plan:     I have personally reviewed and noted the following in the patient's chart:   Medical and social history Use of alcohol, tobacco or illicit drugs  Current medications and supplements including opioid prescriptions. Patient is not currently taking opioid prescriptions. Functional ability and status Nutritional status Physical activity Advanced directives List of other physicians Hospitalizations, surgeries, and ER visits in previous 12 months Vitals Screenings to include cognitive, depression, and falls Referrals and appointments  In addition, I have reviewed and discussed with patient certain preventive protocols, quality metrics, and best practice recommendations. A written personalized care plan for preventive services as well as general preventive health recommendations were provided to patient.     Willette Brace, LPN   [id number]   Nurse Notes: none

## 2022-08-28 ENCOUNTER — Other Ambulatory Visit: Payer: Self-pay | Admitting: Family Medicine

## 2022-08-28 DIAGNOSIS — E119 Type 2 diabetes mellitus without complications: Secondary | ICD-10-CM

## 2022-09-01 DIAGNOSIS — E119 Type 2 diabetes mellitus without complications: Secondary | ICD-10-CM | POA: Diagnosis not present

## 2022-09-01 DIAGNOSIS — H2513 Age-related nuclear cataract, bilateral: Secondary | ICD-10-CM | POA: Diagnosis not present

## 2022-09-01 DIAGNOSIS — H524 Presbyopia: Secondary | ICD-10-CM | POA: Diagnosis not present

## 2022-09-09 DIAGNOSIS — Z0100 Encounter for examination of eyes and vision without abnormal findings: Secondary | ICD-10-CM | POA: Diagnosis not present

## 2022-09-11 ENCOUNTER — Other Ambulatory Visit: Payer: Self-pay | Admitting: Family Medicine

## 2022-09-11 DIAGNOSIS — G43709 Chronic migraine without aura, not intractable, without status migrainosus: Secondary | ICD-10-CM

## 2022-09-11 DIAGNOSIS — G4700 Insomnia, unspecified: Secondary | ICD-10-CM

## 2022-09-11 DIAGNOSIS — F3289 Other specified depressive episodes: Secondary | ICD-10-CM

## 2022-09-11 NOTE — Telephone Encounter (Signed)
Please advise 

## 2022-10-03 ENCOUNTER — Encounter: Payer: Medicare HMO | Admitting: Family Medicine

## 2022-10-04 LAB — HM DIABETES EYE EXAM

## 2022-10-10 ENCOUNTER — Encounter: Payer: Self-pay | Admitting: Family Medicine

## 2022-11-20 ENCOUNTER — Ambulatory Visit (INDEPENDENT_AMBULATORY_CARE_PROVIDER_SITE_OTHER): Payer: Medicare HMO | Admitting: Family Medicine

## 2022-11-20 ENCOUNTER — Other Ambulatory Visit: Payer: Self-pay

## 2022-11-20 ENCOUNTER — Encounter: Payer: Self-pay | Admitting: Family Medicine

## 2022-11-20 VITALS — BP 134/69 | HR 75 | Ht 72.0 in | Wt 177.0 lb

## 2022-11-20 DIAGNOSIS — Z1159 Encounter for screening for other viral diseases: Secondary | ICD-10-CM

## 2022-11-20 DIAGNOSIS — R609 Edema, unspecified: Secondary | ICD-10-CM | POA: Diagnosis not present

## 2022-11-20 DIAGNOSIS — H903 Sensorineural hearing loss, bilateral: Secondary | ICD-10-CM

## 2022-11-20 DIAGNOSIS — E785 Hyperlipidemia, unspecified: Secondary | ICD-10-CM | POA: Diagnosis not present

## 2022-11-20 DIAGNOSIS — F3289 Other specified depressive episodes: Secondary | ICD-10-CM

## 2022-11-20 DIAGNOSIS — Z23 Encounter for immunization: Secondary | ICD-10-CM | POA: Diagnosis not present

## 2022-11-20 DIAGNOSIS — Z8669 Personal history of other diseases of the nervous system and sense organs: Secondary | ICD-10-CM

## 2022-11-20 DIAGNOSIS — R519 Headache, unspecified: Secondary | ICD-10-CM

## 2022-11-20 DIAGNOSIS — G4700 Insomnia, unspecified: Secondary | ICD-10-CM | POA: Diagnosis not present

## 2022-11-20 DIAGNOSIS — E119 Type 2 diabetes mellitus without complications: Secondary | ICD-10-CM

## 2022-11-20 DIAGNOSIS — Z125 Encounter for screening for malignant neoplasm of prostate: Secondary | ICD-10-CM

## 2022-11-20 DIAGNOSIS — E1169 Type 2 diabetes mellitus with other specified complication: Secondary | ICD-10-CM

## 2022-11-20 DIAGNOSIS — L219 Seborrheic dermatitis, unspecified: Secondary | ICD-10-CM

## 2022-11-20 DIAGNOSIS — E1159 Type 2 diabetes mellitus with other circulatory complications: Secondary | ICD-10-CM | POA: Diagnosis not present

## 2022-11-20 DIAGNOSIS — Z0000 Encounter for general adult medical examination without abnormal findings: Secondary | ICD-10-CM

## 2022-11-20 DIAGNOSIS — F439 Reaction to severe stress, unspecified: Secondary | ICD-10-CM

## 2022-11-20 MED ORDER — TRAZODONE HCL 100 MG PO TABS
50.0000 mg | ORAL_TABLET | Freq: Every evening | ORAL | 1 refills | Status: DC | PRN
Start: 2022-11-20 — End: 2022-11-27

## 2022-11-20 NOTE — Progress Notes (Signed)
Complete physical exam   Patient: Billy Hardin   DOB: [birth year]   72 y.o. Male  MRN: [id number] Visit Date: 11/20/2022  Today's healthcare provider: Mila Merry, MD   Chief Complaint  Patient presents with   Annual Exam   Hypertension   Hyperlipidemia   Subjective    Billy Hardin is a 72 y.o. male who presents today for a complete physical exam.  He reports consuming a  regular  diet.  He generally feels well. He reports sleeping fairly well. He does not have additional problems to discuss today. He had his AWV in March  He is also due to follow up on diabetes, reports sugars typically averaging around 150. Doing well with metformin, no side effects. He also reports having a bit more swelling in his feet and legs.   He also reports he is having frequent headaches. He has long history of migraines. Headaches can be on either side, sometimes both. Usually improve by turning off lights.     Past Medical History:  Diagnosis Date   Chronic migraine without aura without status migrainosus, not intractable 05/30/2016   Complication of anesthesia    had catheter 4 days unable to void   Deaf    Rt ear   Diabetes mellitus without complication (HCC)    Elevated lipids    Herpes    Insomnia    Seasonal allergies    TMJ (dislocation of temporomandibular joint)    Uses hearing aid    Left   Past Surgical History:  Procedure Laterality Date   KIDNEY STONE SURGERY     MIDDLE EAR SURGERY     PENILE PROSTHESIS IMPLANT N/A 11/03/2014   Procedure: PENILE PROTHESIS INFLATABLE;  Surgeon: Orson Ape, MD;  Location: [facility] ORS;  Service: Urology;  Laterality: N/A;   TONSILLECTOMY AND ADENOIDECTOMY     Social History   Socioeconomic History   Marital status: Married    Spouse name: Not on file   Number of children: Not on file   Years of education: Not on file   Highest education level: Not on file  Occupational History   Not on file  Tobacco Use   Smoking status:  Never   Smokeless tobacco: Never  Vaping Use   Vaping Use: Never used  Substance and Sexual Activity   Alcohol use: Yes    Comment: 1-2 drinks total of 2-3x a year   Drug use: No   Sexual activity: Yes    Partners: Female  Other Topics Concern   Not on file  Social History Narrative   Not on file   Social Determinants of Health   Financial Resource Strain: Low Risk  (08/07/2022)   Overall Financial Resource Strain (CARDIA)    Difficulty of Paying Living Expenses: Not hard at all  Food Insecurity: No Food Insecurity (08/07/2022)   Hunger Vital Sign    Worried About Running Out of Food in the Last Year: Never true    Ran Out of Food in the Last Year: Never true  Transportation Needs: No Transportation Needs (08/07/2022)   PRAPARE - Administrator, Civil Service (Medical): No    Lack of Transportation (Non-Medical): No  Physical Activity: Insufficiently Active (08/07/2022)   Exercise Vital Sign    Days of Exercise per Week: 2 days    Minutes of Exercise per Session: 30 min  Stress: No Stress Concern Present (08/07/2022)   [employer] of Occupational Health - Occupational  Stress Questionnaire    Feeling of Stress : Not at all  Social Connections: Socially Isolated (08/07/2022)   Social Connection and Isolation Panel [NHANES]    Frequency of Communication with Friends and Family: Once a week    Frequency of Social Gatherings with Friends and Family: Once a week    Attends Religious Services: Never    Database administrator or Organizations: No    Attends Banker Meetings: Never    Marital Status: Married  Catering manager Violence: Not At Risk (08/07/2022)   Humiliation, Afraid, Rape, and Kick questionnaire    Fear of Current or Ex-Partner: No    Emotionally Abused: No    Physically Abused: No    Sexually Abused: No   Family Status  Relation Name Status   Mother  Deceased at age 62       Cancer   Father  Deceased at age 24       cancer    Brother  Alive   Family History  Problem Relation Age of Onset   Cancer Mother    CAD Brother    Allergies  Allergen Reactions   Bupropion     insomnia   Sertraline     Ejaculatory dysfunction   Penicillins Rash    Patient Care Team: Malva Limes, MD as PCP - General (Family Medicine) [facility], [place]   Medications: Outpatient Medications Prior to Visit  Medication Sig   atorvastatin (LIPITOR) 20 MG tablet Take 1 tablet by mouth once daily   baclofen (LIORESAL) 10 MG tablet Take 1 tablet (10 mg total) by mouth 3 (three) times daily.   blood glucose meter kit and supplies KIT Dispense based on patient and insurance preference. Check sugar daily   cholecalciferol (VITAMIN D) 400 units TABS tablet Take 400 Units by mouth.   CINNAMON PO Take 2 capsules by mouth daily.   ibuprofen (ADVIL) 600 MG tablet Take 1 tablet (600 mg total) by mouth every 6 (six) hours as needed.   meloxicam (MOBIC) 7.5 MG tablet Take 1-2 tablets (7.5-15 mg total) by mouth daily. For arthritis in hip/knee   metFORMIN (GLUCOPHAGE) 1000 MG tablet TAKE 1 TABLET BY MOUTH TWICE DAILY WITH A MEAL   metoprolol succinate (TOPROL-XL) 50 MG 24 hr tablet TAKE 1 TABLET BY MOUTH ONCE DAILY WITH OR IMMEDIATELY FOLLOWING A MEAL   mirtazapine (REMERON) 30 MG tablet TAKE 1 TABLET BY MOUTH AT BEDTIME   Omega-3 Fatty Acids (OMEGA 3 500 PO) Take by mouth.   omeprazole (PRILOSEC) 40 MG capsule TAKE 1 CAPSULE BY MOUTH ONCE DAILY AS NEEDED FOR HEART   ONE TOUCH ULTRA TEST test strip CHECK BLOOD SUGAR DAILY   ONETOUCH DELICA LANCETS 33G MISC TEST BLOOD SUGAR DAILY   valACYclovir (VALTREX) 1000 MG tablet Take 1 tablet by mouth once daily   vitamin B-12 (CYANOCOBALAMIN) 1000 MCG tablet Take 1,000 mcg by mouth daily.   vitamin E 400 UNIT capsule Take 400 Units by mouth 2 (two) times daily.   traZODone (DESYREL) 50 MG tablet TAKE 1/2 TO 1 (ONE-HALF TO ONE) TABLET BY MOUTH AT BEDTIME AS NEEDED FOR SLEEP   No  facility-administered medications prior to visit.    Review of Systems  Constitutional:  Negative for chills, diaphoresis and fever.  HENT:  Positive for hearing loss (left (wears hearing aid)). Negative for congestion, ear discharge, ear pain, nosebleeds and sore throat.   Eyes:  Negative for photophobia, pain, discharge and  redness.  Respiratory:  Negative for cough, shortness of breath, wheezing and stridor.   Cardiovascular:  Negative for chest pain, palpitations and leg swelling.  Gastrointestinal:  Negative for abdominal pain, blood in stool, constipation, diarrhea, nausea and vomiting.  Endocrine: Negative for polydipsia.  Genitourinary:  Negative for dysuria, flank pain, frequency, hematuria and urgency.  Musculoskeletal:  Negative for back pain, myalgias and neck pain.  Skin:  Negative for rash.  Allergic/Immunologic: Negative for environmental allergies.  Neurological:  Positive for headaches. Negative for dizziness, tremors, seizures, syncope, speech difficulty, weakness, light-headedness and numbness.  Hematological:  Does not bruise/bleed easily.  Psychiatric/Behavioral:  Negative for hallucinations and suicidal ideas. The patient is not nervous/anxious.      Objective    BP 134/69 (BP Location: Right Arm, Patient Position: Sitting, Cuff Size: Normal)   Pulse 75   Ht 6' (1.829 m)   Wt 177 lb (80.3 kg)   SpO2 96%   BMI 24.01 kg/m   Physical Exam   General Appearance:    Well developed, well nourished male. Alert, cooperative, in no acute distress, appears stated age  Head:    Normocephalic, without obvious abnormality, atraumatic  Eyes:    PERRL, conjunctiva/corneas clear, EOM's intact, fundi    benign, both eyes       Ears:    Normal TM's and external ear canals, both ears, Hearing aid on left. Moderate seborrhea in both ear canals.   Nose:   Nares normal, septum midline, mucosa normal, no drainage   or sinus tenderness  Throat:   Lips, mucosa, and tongue normal;  teeth and gums normal  Neck:   Supple, symmetrical, trachea midline, no adenopathy;       thyroid:  No enlargement/tenderness/nodules; no carotid   bruit or JVD  Back:     Symmetric, no curvature, ROM normal, no CVA tenderness  Lungs:     Clear to auscultation bilaterally, respirations unlabored  Chest wall:    No tenderness or deformity  Heart:    Normal heart rate. Normal rhythm. No murmurs, rubs, or gallops.  S1 and S2 normal  Abdomen:     Soft, non-tender, bowel sounds active all four quadrants,    no masses, no organomegaly  Genitalia:    deferred  Rectal:    deferred  Extremities:   All extremities are intact. No cyanosis or edema  Pulses:   2+ and symmetric all extremities  Skin:   Skin color, texture, turgor normal, no rashes or lesions  Lymph nodes:   Cervical, supraclavicular, and axillary nodes normal  Neurologic:   CNII-XII intact. Normal strength, sensation and reflexes      throughout     Last depression screening scores    11/20/2022    8:55 AM 08/07/2022    1:13 PM 03/07/2021    8:43 AM  PHQ 2/9 Scores  PHQ - 2 Score 0 0 0  PHQ- 9 Score 1  1   Last fall risk screening    11/20/2022    8:55 AM  Fall Risk   Falls in the past year? 0  Number falls in past yr: 0  Injury with Fall? 0   Last Audit-C alcohol use screening    11/20/2022    8:55 AM  Alcohol Use Disorder Test (AUDIT)  1. How often do you have a drink containing alcohol? 1  2. How many drinks containing alcohol do you have on a typical day when you are drinking? 0  3. How often do  you have six or more drinks on one occasion? 0  AUDIT-C Score 1   A score of 3 or more in women, and 4 or more in men indicates increased risk for alcohol abuse, EXCEPT if all of the points are from question 1   No results found for any visits on 11/20/22.  Assessment & Plan    Routine Health Maintenance and Physical Exam  Exercise Activities and Dietary recommendations  Goals      Patient Stated     None at this  time         Immunization History  Administered Date(s) Administered   Covid-19, Mrna,Vaccine(Spikevax)79yrs and older 02/26/2022   Fluad Quad(high Dose 65+) 02/05/2019, 01/29/2020, 03/07/2021   Influenza-Unspecified 02/13/2015, 04/01/2016, 03/16/2017   PFIZER(Purple Top)SARS-COV-2 Vaccination 07/30/2019, 08/20/2019, 05/31/2020   Pneumococcal Conjugate-13 05/30/2016   Pneumococcal Polysaccharide-23 05/26/2015   Respiratory Syncytial Virus Vaccine,Recomb Aduvanted(Arexvy) 02/23/2022   Td 06/04/2017   Tdap 01/16/2007, 05/28/2020   Zoster Recombinant(Shingrix) 08/03/2021, 10/03/2021   Zoster, Live 06/04/2012    Health Maintenance  Topic Date Due   Hepatitis C Screening  Never done   Pneumonia Vaccine 71+ Years old (3 of 3 - PPSV23 or PCV20) 05/30/2021   Diabetic kidney evaluation - eGFR measurement  07/30/2022   Diabetic kidney evaluation - Urine ACR  08/04/2022   HEMOGLOBIN A1C  11/09/2022   COVID-19 Vaccine (5 - 2023-24 season) 02/13/2023 (Originally 04/23/2022)   INFLUENZA VACCINE  12/14/2022   Medicare Annual Wellness (AWV)  08/07/2023   OPHTHALMOLOGY EXAM  10/04/2023   Colonoscopy  09/29/2026   DTaP/Tdap/Td (4 - Td or Tdap) 05/28/2030   Zoster Vaccines- Shingrix  Completed   HPV VACCINES  Aged Out    Discussed health benefits of physical activity, and encouraged him to engage in regular exercise appropriate for his age and condition.    2. Need for vaccination against Streptococcus pneumoniae  - Pneumococcal conjugate vaccine 20-valent (Prevnar 20)  3. Prostate cancer screening  - PSA  4. Need for hepatitis C screening test  - Hepatitis C antibody  5. Hypertension associated with diabetes (HCC) Well controlled.  Continue current medications.   - EKG 12-Lead  6. Hyperlipidemia associated with type 2 diabetes mellitus (HCC) He is tolerating atorvastatin well with no adverse effects.   - CBC with Differential/Platelet - Comprehensive metabolic panel -  Lipid panel  7. Type 2 diabetes mellitus without complication, without long-term current use of insulin (HCC) Doing well on metformin.  - Hemoglobin A1c - Urine microalbumin-creatinine with uACR  8. Insomnia, unspecified type Not  much better with 50mg  trazodone, change to 100mg  tablets, can take 1/2 -1 hs  9. Edema, unspecified type  - TSH   10. ASNHL (asymmetrical sensorineural hearing loss) Well compensated with hearing aid.   11. Seborrhic dermatitis ear canal Not particularly bothering him at this time.   12. Bilateral headaches   13. History of migraine headaches     Mila Merry, MD  [facility] [phone] (phone) [phone] (fax)  [facility]

## 2022-11-21 LAB — CBC WITH DIFFERENTIAL/PLATELET
Basophils Absolute: 0.1 10*3/uL (ref 0.0–0.2)
Basos: 1 %
EOS (ABSOLUTE): 0.4 10*3/uL (ref 0.0–0.4)
Eos: 5 %
Hematocrit: 41.9 % (ref 37.5–51.0)
Hemoglobin: 14.1 g/dL (ref 13.0–17.7)
Immature Grans (Abs): 0.1 10*3/uL (ref 0.0–0.1)
Immature Granulocytes: 1 %
Lymphocytes Absolute: 2.3 10*3/uL (ref 0.7–3.1)
Lymphs: 30 %
MCH: 31.4 pg (ref 26.6–33.0)
MCHC: 33.7 g/dL (ref 31.5–35.7)
MCV: 93 fL (ref 79–97)
Monocytes Absolute: 0.7 10*3/uL (ref 0.1–0.9)
Monocytes: 9 %
Neutrophils Absolute: 4.2 10*3/uL (ref 1.4–7.0)
Neutrophils: 54 %
Platelets: 234 10*3/uL (ref 150–450)
RBC: 4.49 x10E6/uL (ref 4.14–5.80)
RDW: 12.9 % (ref 11.6–15.4)
WBC: 7.7 10*3/uL (ref 3.4–10.8)

## 2022-11-21 LAB — MICROALBUMIN / CREATININE URINE RATIO
Creatinine, Urine: 165.7 mg/dL
Microalb/Creat Ratio: 10 mg/g creat (ref 0–29)
Microalbumin, Urine: 15.9 ug/mL

## 2022-11-21 LAB — COMPREHENSIVE METABOLIC PANEL
ALT: 12 IU/L (ref 0–44)
AST: 13 IU/L (ref 0–40)
Albumin: 4.7 g/dL (ref 3.8–4.8)
Alkaline Phosphatase: 67 IU/L (ref 44–121)
BUN/Creatinine Ratio: 13 (ref 10–24)
BUN: 14 mg/dL (ref 8–27)
Bilirubin Total: 0.2 mg/dL (ref 0.0–1.2)
CO2: 20 mmol/L (ref 20–29)
Calcium: 9.5 mg/dL (ref 8.6–10.2)
Chloride: 106 mmol/L (ref 96–106)
Creatinine, Ser: 1.07 mg/dL (ref 0.76–1.27)
Globulin, Total: 1.8 g/dL (ref 1.5–4.5)
Glucose: 150 mg/dL — ABNORMAL HIGH (ref 70–99)
Potassium: 4.7 mmol/L (ref 3.5–5.2)
Sodium: 143 mmol/L (ref 134–144)
Total Protein: 6.5 g/dL (ref 6.0–8.5)
eGFR: 74 mL/min/{1.73_m2} (ref >59)

## 2022-11-21 LAB — TSH: TSH: 2.86 u[IU]/mL (ref 0.450–4.500)

## 2022-11-21 LAB — LIPID PANEL
Chol/HDL Ratio: 2.3 ratio (ref 0.0–5.0)
Cholesterol, Total: 108 mg/dL (ref 100–199)
HDL: 48 mg/dL (ref >39)
LDL Chol Calc (NIH): 32 mg/dL (ref 0–99)
Triglycerides: 170 mg/dL — ABNORMAL HIGH (ref 0–149)
VLDL Cholesterol Cal: 28 mg/dL (ref 5–40)

## 2022-11-21 LAB — HEMOGLOBIN A1C
Est. average glucose Bld gHb Est-mCnc: 160 mg/dL
Hgb A1c MFr Bld: 7.2 % — ABNORMAL HIGH (ref 4.8–5.6)

## 2022-11-21 LAB — HEPATITIS C ANTIBODY: Hep C Virus Ab: NONREACTIVE

## 2022-11-21 LAB — PSA: Prostate Specific Ag, Serum: 1.2 ng/mL (ref 0.0–4.0)

## 2022-11-27 ENCOUNTER — Other Ambulatory Visit: Payer: Self-pay | Admitting: Family Medicine

## 2022-11-27 DIAGNOSIS — L219 Seborrheic dermatitis, unspecified: Secondary | ICD-10-CM | POA: Insufficient documentation

## 2022-11-27 DIAGNOSIS — E119 Type 2 diabetes mellitus without complications: Secondary | ICD-10-CM

## 2022-11-27 MED ORDER — TRAZODONE HCL 100 MG PO TABS
50.0000 mg | ORAL_TABLET | Freq: Every evening | ORAL | Status: DC | PRN
Start: 2022-11-27 — End: 2023-03-28

## 2023-02-26 ENCOUNTER — Other Ambulatory Visit: Payer: Self-pay | Admitting: Family Medicine

## 2023-02-26 DIAGNOSIS — E119 Type 2 diabetes mellitus without complications: Secondary | ICD-10-CM

## 2023-02-27 NOTE — Telephone Encounter (Signed)
Requested Prescriptions  Pending Prescriptions Disp Refills   metFORMIN (GLUCOPHAGE) 1000 MG tablet [Pharmacy Med Name: metFORMIN HCl 1000 MG Oral Tablet] 180 tablet 0    Sig: TAKE 1 TABLET BY MOUTH TWICE DAILY WITH MEALS (SCHEDULE  OFFICE  VISIT  FOR  FOLLOW  UP)     Endocrinology:  Diabetes - Biguanides Failed - 02/26/2023  8:41 AM      Failed - B12 Level in normal range and within 720 days    Vitamin B-12  Date Value Ref Range Status  06/04/2017 718 232 - 1,245 pg/mL Final         Passed - Cr in normal range and within 360 days    Creat  Date Value Ref Range Status  02/06/2017 0.95 0.70 - 1.25 mg/dL Final    Comment:    For patients >16 years of age, the reference limit for Creatinine is approximately 13% higher for people identified as African-American. .    Creatinine, Ser  Date Value Ref Range Status  11/20/2022 1.07 0.76 - 1.27 mg/dL Final         Passed - HBA1C is between 0 and 7.9 and within 180 days    Hemoglobin A1C  Date Value Ref Range Status  11/04/2012 6.8 (H) 4.2 - 6.3 % Final    Comment:    The American Diabetes Association recommends that a primary goal of therapy should be <7% and that physicians should reevaluate the treatment regimen in patients with HbA1c values consistently >8%.    Hgb A1c MFr Bld  Date Value Ref Range Status  11/20/2022 7.2 (H) 4.8 - 5.6 % Final    Comment:             Prediabetes: 5.7 - 6.4          Diabetes: >6.4          Glycemic control for adults with diabetes: <7.0          Passed - eGFR in normal range and within 360 days    GFR, Est African American  Date Value Ref Range Status  02/06/2017 97 > OR = 60 mL/min/1.75m2 Final   GFR calc Af Amer  Date Value Ref Range Status  07/29/2019 71 >59 mL/min/1.73 Final   GFR, Est Non African American  Date Value Ref Range Status  02/06/2017 84 > OR = 60 mL/min/1.46m2 Final   GFR, Estimated  Date Value Ref Range Status  05/13/2020 >60 >60 mL/min Final    Comment:     (NOTE) Calculated using the CKD-EPI Creatinine Equation (2021)    eGFR  Date Value Ref Range Status  11/20/2022 74 >59 mL/min/1.73 Final         Passed - Valid encounter within last 6 months    Recent Outpatient Visits           3 months ago Annual physical exam   [facility] Malva Limes, MD   9 months ago Type 2 diabetes mellitus without complication, without long-term current use of insulin (HCC)   [place] [facility] Malva Limes, MD   1 year ago Type 2 diabetes mellitus without complication, without long-term current use of insulin (HCC)   [name] [facility] Malva Limes, MD   1 year ago Hip pain, chronic, right   [place] [facility] [place], [place], PA-C   1 year ago Hip pain, chronic, right   [place] [facility] [place],  Edmon Crape, PA-C       Future Appointments             In 1 month Fisher, Demetrios Isaacs, MD [facility]            Passed - CBC within normal limits and completed in the last 12 months    WBC  Date Value Ref Range Status  11/20/2022 7.7 3.4 - 10.8 x10E3/uL Final  05/13/2020 5.1 4.0 - 10.5 K/uL Final   RBC  Date Value Ref Range Status  11/20/2022 4.49 4.14 - 5.80 x10E6/uL Final  05/13/2020 4.29 4.22 - 5.81 MIL/uL Final   Hemoglobin  Date Value Ref Range Status  11/20/2022 14.1 13.0 - 17.7 g/dL Final   Hematocrit  Date Value Ref Range Status  11/20/2022 41.9 37.5 - 51.0 % Final   MCHC  Date Value Ref Range Status  11/20/2022 33.7 31.5 - 35.7 g/dL Final  16/02/9603 54.0 30.0 - 36.0 g/dL Final   [facility]  Date Value Ref Range Status  11/20/2022 31.4 26.6 - 33.0 pg Final  05/13/2020 32.2 26.0 - 34.0 pg Final   MCV  Date Value Ref Range Status  11/20/2022 93 79 - 97 fL Final  05/27/2014 95 80 - 100 fL Final   No results found for: "PLTCOUNTKUC", "LABPLAT", "POCPLA" RDW  Date Value Ref Range Status   11/20/2022 12.9 11.6 - 15.4 % Final  05/27/2014 12.7 11.5 - 14.5 % Final

## 2023-03-11 ENCOUNTER — Other Ambulatory Visit: Payer: Self-pay | Admitting: Family Medicine

## 2023-03-11 DIAGNOSIS — G43709 Chronic migraine without aura, not intractable, without status migrainosus: Secondary | ICD-10-CM

## 2023-03-26 ENCOUNTER — Other Ambulatory Visit: Payer: Self-pay | Admitting: Family Medicine

## 2023-03-26 DIAGNOSIS — F3289 Other specified depressive episodes: Secondary | ICD-10-CM

## 2023-03-26 DIAGNOSIS — G4700 Insomnia, unspecified: Secondary | ICD-10-CM

## 2023-03-26 NOTE — Telephone Encounter (Signed)
Please advise 

## 2023-03-28 ENCOUNTER — Telehealth: Payer: Self-pay | Admitting: Family Medicine

## 2023-03-28 DIAGNOSIS — G4700 Insomnia, unspecified: Secondary | ICD-10-CM

## 2023-03-28 MED ORDER — TRAZODONE HCL 100 MG PO TABS
100.0000 mg | ORAL_TABLET | Freq: Every evening | ORAL | 2 refills | Status: DC | PRN
Start: 2023-03-28 — End: 2023-12-24

## 2023-03-28 NOTE — Telephone Encounter (Signed)
Patient called in stated he is not able to get his traZODone (DESYREL) 100 MG tablet refilled as pharmacy is saying it is too soon but the last time he got it filled was on 10/1. Pharmacy is saying that the instructions need to read different in order or him to get his refill now. Please f/u with pharmacy. [facility] [address], [place] - [address] Phone: [phone]  Fax: [phone]

## 2023-04-23 ENCOUNTER — Encounter: Payer: Self-pay | Admitting: Family Medicine

## 2023-04-23 ENCOUNTER — Ambulatory Visit: Payer: Medicare HMO | Admitting: Family Medicine

## 2023-04-23 VITALS — BP 139/58 | HR 67 | Ht 72.0 in | Wt 176.5 lb

## 2023-04-23 DIAGNOSIS — I152 Hypertension secondary to endocrine disorders: Secondary | ICD-10-CM | POA: Diagnosis not present

## 2023-04-23 DIAGNOSIS — Z7984 Long term (current) use of oral hypoglycemic drugs: Secondary | ICD-10-CM

## 2023-04-23 DIAGNOSIS — R197 Diarrhea, unspecified: Secondary | ICD-10-CM | POA: Diagnosis not present

## 2023-04-23 DIAGNOSIS — M6289 Other specified disorders of muscle: Secondary | ICD-10-CM

## 2023-04-23 DIAGNOSIS — E1159 Type 2 diabetes mellitus with other circulatory complications: Secondary | ICD-10-CM | POA: Diagnosis not present

## 2023-04-23 DIAGNOSIS — E119 Type 2 diabetes mellitus without complications: Secondary | ICD-10-CM

## 2023-04-23 LAB — POCT GLYCOSYLATED HEMOGLOBIN (HGB A1C): Hemoglobin A1C: 6.2 % — AB (ref 4.0–5.6)

## 2023-04-23 MED ORDER — METFORMIN HCL ER 500 MG PO TB24: 500.0000 mg | ORAL_TABLET | Freq: Two times a day (BID) | ORAL | 1 refills | Status: DC

## 2023-04-23 NOTE — Patient Instructions (Signed)
.   Please review the attached list of medications and notify my office if there are any errors.   . Please bring all of your medications to every appointment so we can make sure that our medication list is the same as yours.   

## 2023-04-24 LAB — COMPREHENSIVE METABOLIC PANEL
ALT: 13 [IU]/L (ref 0–44)
AST: 16 [IU]/L (ref 0–40)
Albumin: 4.5 g/dL (ref 3.8–4.8)
Alkaline Phosphatase: 64 [IU]/L (ref 44–121)
BUN/Creatinine Ratio: 13 (ref 10–24)
BUN: 16 mg/dL (ref 8–27)
Bilirubin Total: 0.5 mg/dL (ref 0.0–1.2)
CO2: 22 mmol/L (ref 20–29)
Calcium: 9.1 mg/dL (ref 8.6–10.2)
Chloride: 106 mmol/L (ref 96–106)
Creatinine, Ser: 1.21 mg/dL (ref 0.76–1.27)
Globulin, Total: 2 g/dL (ref 1.5–4.5)
Glucose: 126 mg/dL — ABNORMAL HIGH (ref 70–99)
Potassium: 3.7 mmol/L (ref 3.5–5.2)
Sodium: 145 mmol/L — ABNORMAL HIGH (ref 134–144)
Total Protein: 6.5 g/dL (ref 6.0–8.5)
eGFR: 64 mL/min/{1.73_m2} (ref >59)

## 2023-04-24 LAB — T4 AND TSH
T4, Total: 8 ug/dL (ref 4.5–12.0)
TSH: 2.01 u[IU]/mL (ref 0.450–4.500)

## 2023-04-24 LAB — CK: Total CK: 88 U/L (ref 41–331)

## 2023-04-24 NOTE — Progress Notes (Signed)
Established patient visit   Patient: Billy Hardin   DOB: August 05, 1950   72 y.o. Male  MRN: [id number] Visit Date: 04/23/2023  Today's healthcare provider: Mila Merry, MD   Chief Complaint  Patient presents with   Medical Management of Chronic Issues    5 month follow-up. Patient last seen 11/20/22.    Diabetes   Hyperlipidemia   Insomnia   Hypertension   Subjective    Discussed the use of AI scribe software for clinical note transcription with the patient, who gave verbal consent to proceed.  History of Present Illness   Billy Hardin, a patient with a history of diabetes, hypertension, and hyperlipidemia, presents for follow up of his chronic conditions. He reports a significant improvement in his blood sugar levels, which he attributes to a more active lifestyle, including regular gym visits three times a week. He continues on high dose metformin for diabetes.    Additionally, he has been experiencing intermittent episodes of diarrhea. He describes a pattern of normal bowel movements followed by increasingly softer stools, culminating in watery diarrhea. These episodes occur every few days and have been ongoing for an unspecified period.  He also noticed swelling in the muscle of his left leg, which appears more prominent than the right. He first noticed this difference a couple of weeks ago and is unsure if it is due to swelling or muscle development from his increased physical activity.  He also mentions occasional swelling in his feet, which can last for a week or two before resolving. Despite these concerns, he reports no pain or discomfort associated with these symptoms.     Lab Results  Component Value Date   HGBA1C 6.2 (A) 04/23/2023   HGBA1C 7.2 (H) 11/20/2022   HGBA1C 7.2 (A) 05/10/2022     Medications: Outpatient Medications Prior to Visit  Medication Sig   atorvastatin (LIPITOR) 20 MG tablet Take 1 tablet by mouth once daily   baclofen (LIORESAL) 10 MG  tablet Take 1 tablet (10 mg total) by mouth 3 (three) times daily.   blood glucose meter kit and supplies KIT Dispense based on patient and insurance preference. Check sugar daily   cholecalciferol (VITAMIN D) 400 units TABS tablet Take 400 Units by mouth.   CINNAMON PO Take 2 capsules by mouth daily.   ibuprofen (ADVIL) 600 MG tablet Take 1 tablet (600 mg total) by mouth every 6 (six) hours as needed.   meloxicam (MOBIC) 7.5 MG tablet Take 1-2 tablets (7.5-15 mg total) by mouth daily. For arthritis in hip/knee   metoprolol succinate (TOPROL-XL) 50 MG 24 hr tablet TAKE 1 TABLET BY MOUTH ONCE DAILY WITH  OR  IMMEDIATELY  FOLLOWING  A  MEAL   mirtazapine (REMERON) 30 MG tablet TAKE 1 TABLET BY MOUTH AT BEDTIME   Omega-3 Fatty Acids (OMEGA 3 500 PO) Take by mouth.   omeprazole (PRILOSEC) 40 MG capsule TAKE 1 CAPSULE BY MOUTH ONCE DAILY AS NEEDED FOR HEART   ONE TOUCH ULTRA TEST test strip CHECK BLOOD SUGAR DAILY   ONETOUCH DELICA LANCETS 33G MISC TEST BLOOD SUGAR DAILY   traZODone (DESYREL) 100 MG tablet Take 1 tablet (100 mg total) by mouth at bedtime as needed for sleep.   valACYclovir (VALTREX) 1000 MG tablet Take 1 tablet by mouth once daily   vitamin B-12 (CYANOCOBALAMIN) 1000 MCG tablet Take 1,000 mcg by mouth daily.   vitamin E 400 UNIT capsule Take 400 Units by mouth 2 (two) times daily.   [  DISCONTINUED] metFORMIN (GLUCOPHAGE) 1000 MG tablet TAKE 1 TABLET BY MOUTH TWICE DAILY WITH MEALS (SCHEDULE  OFFICE  VISIT  FOR  FOLLOW  UP)   No facility-administered medications prior to visit.   Review of Systems     Objective    BP (!) 139/58 (BP Location: Right Arm, Patient Position: Sitting, Cuff Size: Normal)   Pulse 67   Ht 6' (1.829 m)   Wt 176 lb 8 oz (80.1 kg)   SpO2 100%   BMI 23.94 kg/m   Physical Exam   General: Appearance:    Well developed, well nourished male in no acute distress  Eyes:    PERRL, conjunctiva/corneas clear, EOM's intact       Lungs:     Clear to  auscultation bilaterally, respirations unlabored  Heart:    Normal heart rate. Normal rhythm. No murmurs, rubs, or gallops.    MS:   All extremities are intact.  Hypertrophy of left calf muscle compared to right. no discrete masses. No tenderness or erythema. No edema  Neurologic:   Awake, alert, oriented x 3. No apparent focal neurological defect.            Assessment & Plan        Type 2 Diabetes Mellitus Improved glycemic control with A1c dropping from 7.4 to 6.2. Patient has been exercising regularly. However, patient reports diarrhea, likely related to metformin. -Change metformin to extended-release formulation at a lower dose (500mg  BID). -Check A1c in 3-4 months due to medication change.  Hypertension Blood pressure slightly elevated in office, but patient reports it is usually better at home. Patient is exercising regularly. -Continue current antihypertensive regimen.  Unilateral calf swelling Patient reports left leg is noticeably larger than right, but no pain or discomfort. No reported changes in gait. -Order blood work to rule out muscle disorders or inflammatory conditions. consider imaging if labs normal and not improving at follow up vist.   Follow-up Plan for physical in July with cholesterol and PSA checks. Recheck A1c in 3-4 months due to medication change.    Return in about 3 months (around 07/22/2023) for Diabetes.      Mila Merry, MD  [facility] [phone] (phone) [phone] (fax)  [facility]

## 2023-05-14 ENCOUNTER — Other Ambulatory Visit: Payer: Self-pay | Admitting: Family Medicine

## 2023-06-11 ENCOUNTER — Other Ambulatory Visit: Payer: Self-pay | Admitting: Family Medicine

## 2023-06-11 DIAGNOSIS — G43709 Chronic migraine without aura, not intractable, without status migrainosus: Secondary | ICD-10-CM

## 2023-06-27 ENCOUNTER — Other Ambulatory Visit: Payer: Self-pay | Admitting: Family Medicine

## 2023-06-27 DIAGNOSIS — G4700 Insomnia, unspecified: Secondary | ICD-10-CM

## 2023-06-27 DIAGNOSIS — F3289 Other specified depressive episodes: Secondary | ICD-10-CM

## 2023-06-28 NOTE — Telephone Encounter (Signed)
Requested Prescriptions  Pending Prescriptions Disp Refills   mirtazapine (REMERON) 30 MG tablet [Pharmacy Med Name: Mirtazapine 30 MG Oral Tablet] 90 tablet 0    Sig: TAKE 1 TABLET BY MOUTH AT BEDTIME     Psychiatry: Antidepressants - mirtazapine Passed - 06/28/2023 12:02 PM      Passed - Completed PHQ-2 or PHQ-9 in the last 360 days      Passed - Valid encounter within last 6 months    Recent Outpatient Visits           2 months ago Type 2 diabetes mellitus without complication, without long-term current use of insulin (HCC)   [place] Malva Limes, MD   7 months ago Annual physical exam   [facility] Malva Limes, MD   1 year ago Type 2 diabetes mellitus without complication, without long-term current use of insulin (HCC)   [place] [facility] Malva Limes, MD   1 year ago Type 2 diabetes mellitus without complication, without long-term current use of insulin (HCC)   [facility] Malva Limes, MD   1 year ago Hip pain, chronic, right   [facility] [place], [place], PA-C       Future Appointments             In 3 weeks Fisher, Demetrios Isaacs, MD [facility]

## 2023-07-24 ENCOUNTER — Other Ambulatory Visit: Payer: Self-pay | Admitting: Family Medicine

## 2023-07-24 ENCOUNTER — Ambulatory Visit (INDEPENDENT_AMBULATORY_CARE_PROVIDER_SITE_OTHER): Payer: Medicare HMO | Admitting: Family Medicine

## 2023-07-24 VITALS — BP 137/65 | HR 61 | Resp 16 | Ht 72.0 in | Wt 178.2 lb

## 2023-07-24 DIAGNOSIS — E119 Type 2 diabetes mellitus without complications: Secondary | ICD-10-CM

## 2023-07-24 DIAGNOSIS — K5900 Constipation, unspecified: Secondary | ICD-10-CM

## 2023-07-24 LAB — POCT GLYCOSYLATED HEMOGLOBIN (HGB A1C)
Est. average glucose Bld gHb Est-mCnc: 154
Hemoglobin A1C: 7 % — AB (ref 4.0–5.6)

## 2023-07-24 MED ORDER — METFORMIN HCL ER 500 MG PO TB24: ORAL_TABLET | ORAL | Status: DC

## 2023-07-24 MED ORDER — ONETOUCH ULTRA VI STRP: ORAL_STRIP | 4 refills | Status: DC

## 2023-07-24 NOTE — Patient Instructions (Addendum)
 Please review the attached list of medications and notify my office if there are any errors.   Increase metformin 500mg  tablets to 1 every morning and two every night. We'll change to 850mg  twice a day when your next refill is due  I recommend taking over the counter powdered fiber supplements such as Metamucil or Citrucel.

## 2023-07-24 NOTE — Progress Notes (Addendum)
 Established patient visit   Patient: Billy Hardin   DOB: Mar 09, 1951   73 y.o. Male  MRN: [id number] Visit Date: 07/24/2023  Today's healthcare provider: Mila Merry, MD   Chief Complaint  Patient presents with   Medical Management of Chronic Issues    T2DM follow-up   Diabetes   Subjective    Diabetes   Follow up dm since change metformin 1000 bid to metformin ER 500 bid in December due to diarrhea. Since then has been constipated only having Bms every 4-5 days. Sugars still mostly low to mid 100s. Otherwise feels well.   Lab Results  Component Value Date   HGBA1C 7.0 (A) 07/24/2023   HGBA1C 6.2 (A) 04/23/2023   HGBA1C 7.2 (H) 11/20/2022   Not taken any OTC medications for constipation.   Medications: Outpatient Medications Prior to Visit  Medication Sig   atorvastatin (LIPITOR) 20 MG tablet Take 1 tablet by mouth once daily   baclofen (LIORESAL) 10 MG tablet Take 1 tablet (10 mg total) by mouth 3 (three) times daily.   blood glucose meter kit and supplies KIT Dispense based on patient and insurance preference. Check sugar daily   cholecalciferol (VITAMIN D) 400 units TABS tablet Take 400 Units by mouth.   CINNAMON PO Take 2 capsules by mouth daily.   ibuprofen (ADVIL) 600 MG tablet Take 1 tablet (600 mg total) by mouth every 6 (six) hours as needed.   meloxicam (MOBIC) 7.5 MG tablet Take 1-2 tablets (7.5-15 mg total) by mouth daily. For arthritis in hip/knee   metoprolol succinate (TOPROL-XL) 50 MG 24 hr tablet TAKE 1 TABLET BY MOUTH ONCE DAILY (TAKE  WITH  OR  IMMEDIATELY  FOLLOWING  A  MEAL)   mirtazapine (REMERON) 30 MG tablet TAKE 1 TABLET BY MOUTH AT BEDTIME   Omega-3 Fatty Acids (OMEGA 3 500 PO) Take by mouth.   omeprazole (PRILOSEC) 40 MG capsule TAKE 1 CAPSULE BY MOUTH ONCE DAILY AS NEEDED FOR HEART   ONETOUCH DELICA LANCETS 33G MISC TEST BLOOD SUGAR DAILY   traZODone (DESYREL) 100 MG tablet Take 1 tablet (100 mg total) by mouth at bedtime as needed  for sleep.   valACYclovir (VALTREX) 1000 MG tablet Take 1 tablet by mouth once daily   vitamin B-12 (CYANOCOBALAMIN) 1000 MCG tablet Take 1,000 mcg by mouth daily.   vitamin E 400 UNIT capsule Take 400 Units by mouth 2 (two) times daily.   metFORMIN (GLUCOPHAGE-XR) 500 MG 24 hr tablet Take 1 tablet (500 mg total) by mouth 2 (two) times daily.   [DISCONTINUED] ONE TOUCH ULTRA TEST test strip CHECK BLOOD SUGAR DAILY   No facility-administered medications prior to visit.    Review of Systems     Objective    BP 137/65 (BP Location: Left Arm, Patient Position: Sitting, Cuff Size: Normal)   Pulse 61   Resp 16   Ht 6' (1.829 m)   Wt 178 lb 3.2 oz (80.8 kg)   SpO2 100%   BMI 24.17 kg/m    Physical Exam   General appearance: Well developed, well nourished male, cooperative and in no acute distress Head: Normocephalic, without obvious abnormality, atraumatic Respiratory: Respirations even and unlabored, normal respiratory rate Extremities: All extremities are intact.  Skin: Skin color, texture, turgor normal. No rashes seen  Psych: Appropriate mood and affect. Neurologic: Mental status: Alert, oriented to person, place, and time, thought content appropriate.   Results for orders placed or performed in visit on 07/24/23  POCT glycosylated hemoglobin (Hb A1C)  Result Value Ref Range   Hemoglobin A1C 7.0 (A) 4.0 - 5.6 %   Est. average glucose Bld gHb Est-mCnc 154     Assessment & Plan     1. Type 2 diabetes mellitus without complication, without long-term current use of insulin (HCC) (Primary) Up a bit since change from 1000mg  BID IR metformin to 500mg   BID ER metformin. Now having constipation instead of diarrhea.   Increase metFORMIN (GLUCOPHAGE-XR) 500 MG 24 hr tablet; Take one tablet in the morning and two every night  Anticipate change to metformin ER 750mg  BID before the 500s run out  2. Constipation, unspecified constipation type Onset since change in metformin dosage  and formulation. UTD colon cancer screening. Recommend OTC powdered fiber supplements and change metformin as above.   Future Appointments  Date Time Provider Department Center  11/21/2023  8:40 AM Sherrie Mustache, Demetrios Isaacs, MD [facility]          Mila Merry, MD  [facility] [phone] (phone) [phone] (fax)  [facility]

## 2023-08-07 ENCOUNTER — Other Ambulatory Visit: Payer: Self-pay | Admitting: Family Medicine

## 2023-08-07 ENCOUNTER — Encounter: Payer: Self-pay | Admitting: Family Medicine

## 2023-08-07 DIAGNOSIS — R12 Heartburn: Secondary | ICD-10-CM

## 2023-08-07 DIAGNOSIS — E119 Type 2 diabetes mellitus without complications: Secondary | ICD-10-CM

## 2023-08-07 MED ORDER — ONETOUCH ULTRA VI STRP: ORAL_STRIP | 4 refills | Status: DC

## 2023-08-07 MED ORDER — ONETOUCH ULTRA 2 W/DEVICE KIT: PACK | 0 refills | Status: DC

## 2023-08-08 ENCOUNTER — Other Ambulatory Visit: Payer: Self-pay | Admitting: Family Medicine

## 2023-08-08 ENCOUNTER — Telehealth: Payer: Self-pay | Admitting: Family Medicine

## 2023-08-08 MED ORDER — ACCU-CHEK AVIVA PLUS VI STRP: ORAL_STRIP | 4 refills | Status: DC

## 2023-08-08 MED ORDER — ACCU-CHEK AVIVA PLUS W/DEVICE KIT: PACK | 0 refills | Status: DC

## 2023-08-08 NOTE — Addendum Note (Signed)
 Addended by: Malva Limes on: 08/08/2023 12:13 PM   Modules accepted: Orders

## 2023-08-08 NOTE — Telephone Encounter (Signed)
 The rx request has been approved and sent to the pharmacy on file

## 2023-08-08 NOTE — Telephone Encounter (Signed)
 [facility] is requesting refill Blood Glucose Monitoring Suppl (ACCU-CHEK AVIVA PLUS) w/Device KIT   Please advise

## 2023-08-08 NOTE — Telephone Encounter (Signed)
 Requested Prescriptions  Pending Prescriptions Disp Refills   omeprazole (PRILOSEC) 40 MG capsule [Pharmacy Med Name: Omeprazole 40 MG Oral Capsule Delayed Release] 90 capsule 0    Sig: TAKE 1 CAPSULE BY MOUTH ONCE DAILY AS NEEDED FOR HEART     Gastroenterology: Proton Pump Inhibitors Passed - 08/08/2023  2:59 PM      Passed - Valid encounter within last 12 months    Recent Outpatient Visits           2 weeks ago Type 2 diabetes mellitus without complication, without long-term current use of insulin (HCC)   [facility] Malva Limes, MD       Future Appointments             In 3 months Fisher, Demetrios Isaacs, MD [facility]

## 2023-08-17 ENCOUNTER — Other Ambulatory Visit: Payer: Self-pay | Admitting: Family Medicine

## 2023-08-17 DIAGNOSIS — E119 Type 2 diabetes mellitus without complications: Secondary | ICD-10-CM

## 2023-08-17 MED ORDER — METFORMIN HCL ER 750 MG PO TB24: 750.0000 mg | ORAL_TABLET | Freq: Two times a day (BID) | ORAL | 1 refills | Status: DC

## 2023-08-30 ENCOUNTER — Ambulatory Visit
Admission: EM | Admit: 2023-08-30 | Discharge: 2023-08-30 | Disposition: A | Discharge status: Home / Self Care | Attending: Family Medicine | Admitting: Family Medicine

## 2023-08-30 DIAGNOSIS — J302 Other seasonal allergic rhinitis: Secondary | ICD-10-CM | POA: Diagnosis not present

## 2023-08-30 MED ORDER — FEXOFENADINE HCL 180 MG PO TABS: 180.0000 mg | ORAL_TABLET | Freq: Every day | ORAL | 1 refills | Status: AC

## 2023-08-30 MED ORDER — AZELASTINE HCL 0.1 % NA SOLN
1.0000 | Freq: Two times a day (BID) | NASAL | 1 refills | Status: AC
Start: 2023-08-30 — End: ?

## 2023-08-30 NOTE — Discharge Instructions (Signed)
 1. Seasonal allergies (Primary) - fexofenadine (ALLEGRA) 180 MG tablet; Take 1 tablet (180 mg total) by mouth daily.  Dispense: 60 tablet; Refill: 1 - azelastine (ASTELIN) 0.1 % nasal spray; Place 1 spray into both nostrils 2 (two) times daily. Use in each nostril as directed  Dispense: 30 mL; Refill: 1 -Continue to monitor symptoms for any change in severity if there is any escalation of current symptoms or development of new symptoms follow-up in ER for further evaluation and management.

## 2023-08-30 NOTE — ED Provider Notes (Signed)
 [facility]  Note:  This document was prepared using Conservation officer, historic buildings and may include unintentional dictation errors.  MRN: [id number] DOB: [birth year]  Subjective:   Billy Hardin is a 73 y.o. male presenting for nasal congestion, sneezing, nasal drainage, sinus pressure x 1 week.  Patient tested for COVID at home which was negative.  Denies any cough or fever, no bodyaches, no shortness of breath, chest pain, weakness, dizziness.  Patient denies taking any OTC allergy medication or nasal sprays.  Did take Mucinex and NyQuil with little relief.  Patient reports that last week he was up in [place] where the trees have not blown gout yet and states that he did not have any symptoms at all until he returned back to [place].  No current facility-administered medications for this encounter.  Current Outpatient Medications:    azelastine (ASTELIN) 0.1 % nasal spray, Place 1 spray into both nostrils 2 (two) times daily. Use in each nostril as directed, Disp: 30 mL, Rfl: 1   fexofenadine (ALLEGRA) 180 MG tablet, Take 1 tablet (180 mg total) by mouth daily., Disp: 60 tablet, Rfl: 1   atorvastatin (LIPITOR) 20 MG tablet, Take 1 tablet by mouth once daily, Disp: 90 tablet, Rfl: 2   blood glucose meter kit and supplies KIT, Dispense based on patient and insurance preference. Check sugar daily, Disp: 1 each, Rfl: 0   Blood Glucose Monitoring Suppl (ACCU-CHEK AVIVA PLUS) w/Device KIT, Use to check blood sugar daily for type 2 diabetes. E11.9, Disp: 1 kit, Rfl: 0   cholecalciferol (VITAMIN D) 400 units TABS tablet, Take 400 Units by mouth., Disp: , Rfl:    CINNAMON PO, Take 2 capsules by mouth daily., Disp: , Rfl:    glucose blood (ACCU-CHEK AVIVA PLUS) test strip, Use to check blood sugar once a day, Disp: 100 each, Rfl: 4   ibuprofen (ADVIL) 600 MG tablet, Take 1 tablet (600 mg total) by mouth every 6 (six) hours as needed., Disp: 30 tablet, Rfl: 0   meloxicam  (MOBIC) 7.5 MG tablet, Take 1-2 tablets (7.5-15 mg total) by mouth daily. For arthritis in hip/knee, Disp: 60 tablet, Rfl: 1   metFORMIN (GLUCOPHAGE-XR) 750 MG 24 hr tablet, Take 1 tablet (750 mg total) by mouth 2 (two) times daily with a meal., Disp: 180 tablet, Rfl: 1   metoprolol succinate (TOPROL-XL) 50 MG 24 hr tablet, TAKE 1 TABLET BY MOUTH ONCE DAILY (TAKE  WITH  OR  IMMEDIATELY  FOLLOWING  A  MEAL), Disp: 90 tablet, Rfl: 3   mirtazapine (REMERON) 30 MG tablet, TAKE 1 TABLET BY MOUTH AT BEDTIME, Disp: 90 tablet, Rfl: 0   Omega-3 Fatty Acids (OMEGA 3 500 PO), Take by mouth., Disp: , Rfl:    omeprazole (PRILOSEC) 40 MG capsule, TAKE 1 CAPSULE BY MOUTH ONCE DAILY AS NEEDED FOR HEART, Disp: 90 capsule, Rfl: 0   ONETOUCH DELICA LANCETS 33G MISC, TEST BLOOD SUGAR DAILY, Disp: 100 each, Rfl: 3   traZODone (DESYREL) 100 MG tablet, Take 1 tablet (100 mg total) by mouth at bedtime as needed for sleep., Disp: 90 tablet, Rfl: 2   vitamin B-12 (CYANOCOBALAMIN) 1000 MCG tablet, Take 1,000 mcg by mouth daily., Disp: , Rfl:    vitamin E 400 UNIT capsule, Take 400 Units by mouth 2 (two) times daily., Disp: , Rfl:    Allergies  Allergen Reactions   Bupropion     insomnia   Sertraline     Ejaculatory dysfunction  Penicillins Rash    Past Medical History:  Diagnosis Date   Chronic migraine without aura without status migrainosus, not intractable 05/30/2016   Complication of anesthesia    had catheter 4 days unable to void   Deaf    Rt ear   Diabetes mellitus without complication (HCC)    Elevated lipids    Herpes    Insomnia    Seasonal allergies    TMJ (dislocation of temporomandibular joint)    Uses hearing aid    Left     Past Surgical History:  Procedure Laterality Date   KIDNEY STONE SURGERY     MIDDLE EAR SURGERY     PENILE PROSTHESIS IMPLANT N/A 11/03/2014   Procedure: PENILE PROTHESIS INFLATABLE;  Surgeon: Rea Cambridge, MD;  Location: [facility] ORS;  Service: Urology;  Laterality:  N/A;   TONSILLECTOMY AND ADENOIDECTOMY      Family History  Problem Relation Age of Onset   Cancer Mother    CAD Brother     Social History   Tobacco Use   Smoking status: Never   Smokeless tobacco: Never  Vaping Use   Vaping status: Never Used  Substance Use Topics   Alcohol use: Yes    Comment: 1-2 drinks total of 2-3x a year   Drug use: No    ROS Refer to HPI for ROS details.  Objective:   Vitals: BP (!) 147/64 (BP Location: Left Arm)   Pulse 74   Temp 98.4 F (36.9 C) (Oral)   Resp 16   SpO2 98%   Physical Exam Vitals and nursing note reviewed.  Constitutional:      General: He is not in acute distress.    Appearance: He is well-developed. He is not ill-appearing or toxic-appearing.  HENT:     Head: Normocephalic.     Nose: Congestion and rhinorrhea present.     Mouth/Throat:     Mouth: Mucous membranes are moist.     Pharynx: No oropharyngeal exudate or posterior oropharyngeal erythema.  Eyes:     General:        Right eye: No discharge.        Left eye: No discharge.     Extraocular Movements: Extraocular movements intact.     Conjunctiva/sclera: Conjunctivae normal.  Cardiovascular:     Rate and Rhythm: Normal rate and regular rhythm.     Heart sounds: No murmur heard. Pulmonary:     Effort: Pulmonary effort is normal. No respiratory distress.     Breath sounds: Normal breath sounds. No stridor. No wheezing, rhonchi or rales.  Skin:    General: Skin is warm and dry.  Neurological:     General: No focal deficit present.     Mental Status: He is alert and oriented to person, place, and time.  Psychiatric:        Mood and Affect: Mood normal.        Behavior: Behavior normal.     Procedures  No results found for this or any previous visit (from the past 24 hours).  Assessment and Plan :     Discharge Instructions      1. Seasonal allergies (Primary) - fexofenadine (ALLEGRA) 180 MG tablet; Take 1 tablet (180 mg total) by mouth  daily.  Dispense: 60 tablet; Refill: 1 - azelastine (ASTELIN) 0.1 % nasal spray; Place 1 spray into both nostrils 2 (two) times daily. Use in each nostril as directed  Dispense: 30 mL; Refill: 1 -Continue to monitor symptoms  for any change in severity if there is any escalation of current symptoms or development of new symptoms follow-up in ER for further evaluation and management.        Geet Hosking B Morgan Rennert   Michaelpaul Apo, [place], [place] 08/30/23 1235

## 2023-08-30 NOTE — ED Triage Notes (Signed)
 Chief Complaint: nasal congestion, sinus pressure, sneezing, and nasal drainage. Denies a cough or fever. Home COVID test was negative.   Sick exposure: No  Onset: 1 week  Prescriptions or OTC medications tried: Yes- Mucinex D, Nyquil    with little relief  New foods, medications, or products: No  Recent Travel: Yes, [place]  Last Thursday to this Monday.

## 2023-08-31 ENCOUNTER — Other Ambulatory Visit: Payer: Self-pay

## 2023-08-31 DIAGNOSIS — E119 Type 2 diabetes mellitus without complications: Secondary | ICD-10-CM

## 2023-08-31 MED ORDER — ACCU-CHEK AVIVA PLUS W/DEVICE KIT: PACK | 0 refills | Status: DC

## 2023-09-05 ENCOUNTER — Other Ambulatory Visit: Payer: Self-pay | Admitting: Family Medicine

## 2023-09-05 ENCOUNTER — Ambulatory Visit: Payer: Self-pay

## 2023-09-05 DIAGNOSIS — J0111 Acute recurrent frontal sinusitis: Secondary | ICD-10-CM

## 2023-09-05 MED ORDER — AZITHROMYCIN 250 MG PO TABS: ORAL_TABLET | ORAL | 0 refills | Status: AC

## 2023-09-05 NOTE — Telephone Encounter (Signed)
 LMTCB

## 2023-09-05 NOTE — Telephone Encounter (Signed)
 Have sent prescription for antibiotic for sinus infection to [facility]. Call if not  much better when finished.

## 2023-09-05 NOTE — Telephone Encounter (Signed)
 Chief Complaint: Sinus pain/pressure/green phlegm Symptoms: Pain and pressure in face, shortness of breath from congestion Frequency: since April 15 Pertinent Negatives: Patient denies fever Disposition: [] ED /[] Urgent Care (no appt availability in office) / [x] Appointment(In office/virtual)/ []  [facility]/ [] Home Care/ [] Refused Recommended Disposition /[] [facility]/ []  Follow-up with PCP Additional Notes: Patient called in stating he has been experiencing ongoing symptoms since April 15th. Patient was seen in UC on April 17th and diagnosed with allergies, but states his symptoms have worsened and he experiencing moderate pain in sinus cavities, green phlegm, and feeling bad despite use of medication given by UC. Patient denies fever. Patient asking for earliest availability. Assisted patient with appt for evaluation.   Copied from CRM [id number]. Topic: Clinical - Red Word Triage >> Sep 05, 2023 11:28 AM Elle L wrote: Red Word that prompted transfer to Nurse Triage: The patient states went to the Urgent Care last week and they diagnosed him with allergies and prescribed over the counter medication and nasal spray. However, the patient is feeling worse. He has a congestion, discolored mucus, runny nose, and he is having difficulty breathing. Reason for Disposition  [1] Nasal discharge AND [2] present > 10 days  Answer Assessment - Initial Assessment Questions 1. LOCATION: "Where does it hurt?"      Around eyes and nose 2. ONSET: "When did the sinus pain start?"  (e.g., hours, days)      Over a week 3. SEVERITY: "How bad is the pain?"   (Scale 1-10; mild, moderate or severe)   - MILD (1-3): doesn't interfere with normal activities    - MODERATE (4-7): interferes with normal activities (e.g., work or school) or awakens from sleep   - SEVERE (8-10): excruciating pain and patient unable to do any normal activities        Moderate 4. RECURRENT SYMPTOM: "Have you ever had  sinus problems before?" If Yes, ask: "When was the last time?" and "What happened that time?"      Yes 5. NASAL CONGESTION: "Is the nose blocked?" If Yes, ask: "Can you open it or must you breathe through your mouth?"     Yes 6. NASAL DISCHARGE: "Do you have discharge from your nose?" If so ask, "What color?"     Yellow  7. FEVER: "Do you have a fever?" If Yes, ask: "What is it, how was it measured, and when did it start?"      No 8. OTHER SYMPTOMS: "Do you have any other symptoms?" (e.g., sore throat, cough, earache, difficulty breathing)     Difficulty breathing through nose  Protocols used: Sinus Pain or Congestion-A-AH

## 2023-09-06 ENCOUNTER — Other Ambulatory Visit (HOSPITAL_COMMUNITY): Payer: Self-pay

## 2023-09-06 ENCOUNTER — Encounter: Payer: Self-pay | Admitting: Family Medicine

## 2023-09-06 ENCOUNTER — Ambulatory Visit (INDEPENDENT_AMBULATORY_CARE_PROVIDER_SITE_OTHER): Admitting: Family Medicine

## 2023-09-06 VITALS — BP 127/75 | HR 80 | Resp 16 | Wt 177.0 lb

## 2023-09-06 DIAGNOSIS — Z7984 Long term (current) use of oral hypoglycemic drugs: Secondary | ICD-10-CM

## 2023-09-06 DIAGNOSIS — J0111 Acute recurrent frontal sinusitis: Secondary | ICD-10-CM

## 2023-09-06 DIAGNOSIS — E119 Type 2 diabetes mellitus without complications: Secondary | ICD-10-CM

## 2023-09-06 MED ORDER — BLOOD GLUCOSE MONITORING SUPPL DEVI
1.0000 | Freq: Every day | 0 refills | Status: AC
Start: 2023-09-06 — End: ?

## 2023-09-06 MED ORDER — LANCET DEVICE MISC: 1.0000 | Freq: Every day | 0 refills | Status: AC

## 2023-09-06 MED ORDER — BLOOD GLUCOSE TEST VI STRP: 1.0000 | ORAL_STRIP | Freq: Every day | 3 refills | Status: AC

## 2023-09-06 MED ORDER — METFORMIN HCL ER 750 MG PO TB24: 750.0000 mg | ORAL_TABLET | Freq: Two times a day (BID) | ORAL | 1 refills | Status: AC

## 2023-09-06 MED ORDER — LANCETS MISC. MISC: 1.0000 | Freq: Every day | 3 refills | Status: AC

## 2023-09-06 NOTE — Telephone Encounter (Signed)
 Patient was seen today by Dr. Athena Bland.

## 2023-09-06 NOTE — Progress Notes (Signed)
 Established patient visit   Patient: Billy Hardin   DOB: 1950-06-23   73 y.o. Male  MRN: [id number] Visit Date: 09/06/2023  Today's healthcare provider: Carlean Charter, DO   No chief complaint on file.  Subjective    HPI Billy Hardin is a 73 year old male who presents for a prescription refill for metformin  and a blood glucose device.  He is seeking a refill for his metformin  prescription and a blood glucose monitoring device. The pharmacy removed the prescription from his file after he did not pick it up, despite having provided his contact information for notifications. He has not received any notifications when prescriptions are ready.  He has been experiencing sinus problems for about a week and a half. He visited a clinic where he was diagnosed with allergies and was given a nasal spray. He was also advised to take over-the-counter medication, though he cannot recall the specific medication recommended. Despite these treatments, he reports persistent congestion and significant facial pressure. No fever or chills. He took a COVID test the week before last and another one yesterday morning, both of which were negative.  He has started taking azithromycin  (Z-Pak) as of last night, beginning with the first two doses.      Medications: Outpatient Medications Prior to Visit  Medication Sig   atorvastatin  (LIPITOR) 20 MG tablet Take 1 tablet by mouth once daily   azelastine  (ASTELIN ) 0.1 % nasal spray Place 1 spray into both nostrils 2 (two) times daily. Use in each nostril as directed   azithromycin  (ZITHROMAX ) 250 MG tablet Take 2 tablets on day 1, then 1 tablet daily on days 2 through 5   blood glucose meter kit and supplies KIT Dispense based on patient and insurance preference. Check sugar daily   cholecalciferol (VITAMIN D ) 400 units TABS tablet Take 400 Units by mouth.   CINNAMON PO Take 2 capsules by mouth daily.   fexofenadine  (ALLEGRA ) 180 MG tablet Take 1  tablet (180 mg total) by mouth daily.   ibuprofen  (ADVIL ) 600 MG tablet Take 1 tablet (600 mg total) by mouth every 6 (six) hours as needed.   meloxicam  (MOBIC ) 7.5 MG tablet Take 1-2 tablets (7.5-15 mg total) by mouth daily. For arthritis in hip/knee   metoprolol  succinate (TOPROL -XL) 50 MG 24 hr tablet TAKE 1 TABLET BY MOUTH ONCE DAILY (TAKE  WITH  OR  IMMEDIATELY  FOLLOWING  A  MEAL)   mirtazapine  (REMERON ) 30 MG tablet TAKE 1 TABLET BY MOUTH AT BEDTIME   Omega-3 Fatty Acids (OMEGA 3 500 PO) Take by mouth.   omeprazole  (PRILOSEC) 40 MG capsule TAKE 1 CAPSULE BY MOUTH ONCE DAILY AS NEEDED FOR HEART   ONETOUCH DELICA LANCETS 33G MISC TEST BLOOD SUGAR DAILY   traZODone  (DESYREL ) 100 MG tablet Take 1 tablet (100 mg total) by mouth at bedtime as needed for sleep.   vitamin B-12 (CYANOCOBALAMIN ) 1000 MCG tablet Take 1,000 mcg by mouth daily.   vitamin E 400 UNIT capsule Take 400 Units by mouth 2 (two) times daily.   [DISCONTINUED] glucose blood (ACCU-CHEK AVIVA PLUS) test strip Use to check blood sugar once a day   [DISCONTINUED] metFORMIN  (GLUCOPHAGE -XR) 750 MG 24 hr tablet Take 1 tablet (750 mg total) by mouth 2 (two) times daily with a meal.   [DISCONTINUED] Blood Glucose Monitoring Suppl (ACCU-CHEK AVIVA PLUS) w/Device KIT Use to check blood sugar daily for type 2 diabetes. E11.9   No facility-administered medications prior  to visit.    Review of Systems  HENT:  Positive for congestion and sinus pressure. Negative for ear pain.   Respiratory: Negative.  Negative for cough, shortness of breath and wheezing.   Cardiovascular:  Negative for chest pain, palpitations and leg swelling.        Objective    BP 127/75 (BP Location: Left Arm, Patient Position: Sitting, Cuff Size: Normal)   Pulse 80   Resp 16   Wt 177 lb (80.3 kg)   SpO2 100%   BMI 24.01 kg/m     Physical Exam Vitals and nursing note reviewed.  Constitutional:      General: He is not in acute distress.    Appearance:  Normal appearance.  HENT:     Head: Normocephalic and atraumatic.     Right Ear: Tympanic membrane, ear canal and external ear normal. There is no impacted cerumen.     Left Ear: Tympanic membrane, ear canal and external ear normal. There is no impacted cerumen.     Nose: Congestion and rhinorrhea present.     Mouth/Throat:     Mouth: Mucous membranes are dry.     Pharynx: Oropharynx is clear. No oropharyngeal exudate or posterior oropharyngeal erythema.  Eyes:     General: No scleral icterus.    Conjunctiva/sclera: Conjunctivae normal.  Cardiovascular:     Rate and Rhythm: Normal rate.  Pulmonary:     Effort: Pulmonary effort is normal.  Neurological:     Mental Status: He is alert and oriented to person, place, and time. Mental status is at baseline.  Psychiatric:        Mood and Affect: Mood normal.        Behavior: Behavior normal.      No results found for any visits on 09/06/23.  Assessment & Plan    Type 2 diabetes mellitus without complication, without long-term current use of insulin  (HCC) -     metFORMIN  HCl ER; Take 1 tablet (750 mg total) by mouth 2 (two) times daily with a meal.  Dispense: 180 tablet; Refill: 1 -     Blood Glucose Monitoring Suppl; 1 each by Does not apply route daily before breakfast. May substitute to any manufacturer covered by patient's insurance.  Dispense: 1 each; Refill: 0 -     Blood Glucose Test; 1 each by In Vitro route daily before breakfast. May substitute to any manufacturer covered by patient's insurance.  Dispense: 100 strip; Refill: 3 -     Lancet Device; 1 each by Does not apply route daily before breakfast. May substitute to any manufacturer covered by patient's insurance.  Dispense: 1 each; Refill: 0 -     Lancets Misc.; 1 each by Does not apply route daily before breakfast. May substitute to any manufacturer covered by patient's insurance.  Dispense: 100 each; Refill: 3  Acute recurrent frontal sinusitis     Type 2 diabetes  mellitus Requires metformin  and blood glucose monitoring device. Pharmacy issue with prescription reported. - Resend prescription for metformin . - Resend prescription for blood glucose monitoring device.  Acute recurrent frontal sinusitis Sinus congestion and facial pressure for 1.5 weeks. COVID test negative. No fever. - Continue azithromycin  (Z-Pak) as prescribed.   Return if symptoms worsen or fail to improve.      I discussed the assessment and treatment plan with the patient  The patient was provided an opportunity to ask questions and all were answered. The patient agreed with the plan and demonstrated an understanding  of the instructions.   The patient was advised to call back or seek an in-person evaluation if the symptoms worsen or if the condition fails to improve as anticipated.    Carlean Charter, DO  [facility] [phone] (phone) [phone] (fax)  [facility]

## 2023-09-27 ENCOUNTER — Other Ambulatory Visit: Payer: Self-pay | Admitting: Family Medicine

## 2023-09-27 DIAGNOSIS — F3289 Other specified depressive episodes: Secondary | ICD-10-CM

## 2023-09-27 DIAGNOSIS — G4700 Insomnia, unspecified: Secondary | ICD-10-CM

## 2023-11-05 ENCOUNTER — Other Ambulatory Visit: Payer: Self-pay | Admitting: Family Medicine

## 2023-11-05 DIAGNOSIS — R12 Heartburn: Secondary | ICD-10-CM

## 2023-11-13 LAB — HM DIABETES EYE EXAM

## 2023-11-21 ENCOUNTER — Ambulatory Visit (INDEPENDENT_AMBULATORY_CARE_PROVIDER_SITE_OTHER): Admitting: Family Medicine

## 2023-11-21 ENCOUNTER — Encounter: Payer: Self-pay | Admitting: Family Medicine

## 2023-11-21 VITALS — BP 124/69 | HR 65 | Resp 16 | Ht 72.0 in | Wt 174.1 lb

## 2023-11-21 DIAGNOSIS — I152 Hypertension secondary to endocrine disorders: Secondary | ICD-10-CM

## 2023-11-21 DIAGNOSIS — G4700 Insomnia, unspecified: Secondary | ICD-10-CM

## 2023-11-21 DIAGNOSIS — Z0001 Encounter for general adult medical examination with abnormal findings: Secondary | ICD-10-CM

## 2023-11-21 DIAGNOSIS — E1159 Type 2 diabetes mellitus with other circulatory complications: Secondary | ICD-10-CM | POA: Diagnosis not present

## 2023-11-21 DIAGNOSIS — E119 Type 2 diabetes mellitus without complications: Secondary | ICD-10-CM

## 2023-11-21 DIAGNOSIS — E785 Hyperlipidemia, unspecified: Secondary | ICD-10-CM

## 2023-11-21 DIAGNOSIS — Z0000 Encounter for general adult medical examination without abnormal findings: Secondary | ICD-10-CM

## 2023-11-21 DIAGNOSIS — E1169 Type 2 diabetes mellitus with other specified complication: Secondary | ICD-10-CM

## 2023-11-21 DIAGNOSIS — Z125 Encounter for screening for malignant neoplasm of prostate: Secondary | ICD-10-CM

## 2023-11-21 DIAGNOSIS — Z7984 Long term (current) use of oral hypoglycemic drugs: Secondary | ICD-10-CM | POA: Diagnosis not present

## 2023-11-21 DIAGNOSIS — F3289 Other specified depressive episodes: Secondary | ICD-10-CM

## 2023-11-21 DIAGNOSIS — M79675 Pain in left toe(s): Secondary | ICD-10-CM

## 2023-11-22 ENCOUNTER — Ambulatory Visit: Payer: Self-pay | Admitting: Family Medicine

## 2023-11-22 LAB — PSA TOTAL (REFLEX TO FREE): Prostate Specific Ag, Serum: 1 ng/mL (ref 0.0–4.0)

## 2023-11-22 LAB — LIPID PANEL
Chol/HDL Ratio: 2.3 ratio (ref 0.0–5.0)
Cholesterol, Total: 104 mg/dL (ref 100–199)
HDL: 45 mg/dL (ref >39)
LDL Chol Calc (NIH): 40 mg/dL (ref 0–99)
Triglycerides: 102 mg/dL (ref 0–149)
VLDL Cholesterol Cal: 19 mg/dL (ref 5–40)

## 2023-11-22 LAB — COMPREHENSIVE METABOLIC PANEL WITH GFR
ALT: 12 IU/L (ref 0–44)
AST: 14 IU/L (ref 0–40)
Albumin: 4.4 g/dL (ref 3.8–4.8)
Alkaline Phosphatase: 61 IU/L (ref 44–121)
BUN/Creatinine Ratio: 14 (ref 10–24)
BUN: 17 mg/dL (ref 8–27)
Bilirubin Total: 0.3 mg/dL (ref 0.0–1.2)
CO2: 19 mmol/L — ABNORMAL LOW (ref 20–29)
Calcium: 9.1 mg/dL (ref 8.6–10.2)
Chloride: 105 mmol/L (ref 96–106)
Creatinine, Ser: 1.25 mg/dL (ref 0.76–1.27)
Globulin, Total: 2 g/dL (ref 1.5–4.5)
Glucose: 149 mg/dL — ABNORMAL HIGH (ref 70–99)
Potassium: 4.2 mmol/L (ref 3.5–5.2)
Sodium: 143 mmol/L (ref 134–144)
Total Protein: 6.4 g/dL (ref 6.0–8.5)
eGFR: 61 mL/min/1.73 (ref >59)

## 2023-11-22 LAB — MICROALBUMIN / CREATININE URINE RATIO
Creatinine, Urine: 204.9 mg/dL
Microalb/Creat Ratio: 9 mg/g{creat} (ref 0–29)
Microalbumin, Urine: 18.6 ug/mL

## 2023-11-22 LAB — CBC
Hematocrit: 38.3 % (ref 37.5–51.0)
Hemoglobin: 12.8 g/dL — ABNORMAL LOW (ref 13.0–17.7)
MCH: 31.7 pg (ref 26.6–33.0)
MCHC: 33.4 g/dL (ref 31.5–35.7)
MCV: 95 fL (ref 79–97)
Platelets: 212 x10E3/uL (ref 150–450)
RBC: 4.04 x10E6/uL — ABNORMAL LOW (ref 4.14–5.80)
RDW: 12.9 % (ref 11.6–15.4)
WBC: 6.3 x10E3/uL (ref 3.4–10.8)

## 2023-11-22 LAB — HEMOGLOBIN A1C
Est. average glucose Bld gHb Est-mCnc: 154 mg/dL
Hgb A1c MFr Bld: 7 % — ABNORMAL HIGH (ref 4.8–5.6)

## 2023-11-22 NOTE — Progress Notes (Signed)
 Annual Wellness Visit     Patient: Billy Hardin, Male    DOB: 10/28/1950, 73 y.o.   MRN: [id number] Visit Date: 11/21/2023  Today's Provider: Nancyann Perry, MD    Subjective    Billy Hardin is a 73 y.o. male who presents today for his Annual Wellness Visit.  Medications: Outpatient Medications Prior to Visit  Medication Sig   atorvastatin  (LIPITOR) 20 MG tablet Take 1 tablet by mouth once daily   azelastine  (ASTELIN ) 0.1 % nasal spray Place 1 spray into both nostrils 2 (two) times daily. Use in each nostril as directed   blood glucose meter kit and supplies KIT Dispense based on patient and insurance preference. Check sugar daily   Blood Glucose Monitoring Suppl DEVI 1 each by Does not apply route daily before breakfast. May substitute to any manufacturer covered by patient's insurance.   cholecalciferol (VITAMIN D ) 400 units TABS tablet Take 400 Units by mouth.   CINNAMON PO Take 2 capsules by mouth daily.   fexofenadine  (ALLEGRA ) 180 MG tablet Take 1 tablet (180 mg total) by mouth daily.   Glucose Blood (BLOOD GLUCOSE TEST STRIPS) STRP 1 each by In Vitro route daily before breakfast. May substitute to any manufacturer covered by patient's insurance.   ibuprofen  (ADVIL ) 600 MG tablet Take 1 tablet (600 mg total) by mouth every 6 (six) hours as needed.   Lancet Device MISC 1 each by Does not apply route daily before breakfast. May substitute to any manufacturer covered by patient's insurance.   meloxicam  (MOBIC ) 7.5 MG tablet Take 1-2 tablets (7.5-15 mg total) by mouth daily. For arthritis in hip/knee   metFORMIN  (GLUCOPHAGE -XR) 750 MG 24 hr tablet Take 1 tablet (750 mg total) by mouth 2 (two) times daily with a meal.   metoprolol  succinate (TOPROL -XL) 50 MG 24 hr tablet TAKE 1 TABLET BY MOUTH ONCE DAILY (TAKE  WITH  OR  IMMEDIATELY  FOLLOWING  A  MEAL)   mirtazapine  (REMERON ) 30 MG tablet TAKE 1 TABLET BY MOUTH AT BEDTIME   Omega-3 Fatty Acids (OMEGA 3 500 PO) Take by  mouth.   omeprazole  (PRILOSEC) 40 MG capsule TAKE 1 CAPSULE BY MOUTH ONCE DAILY AS NEEDED FOR HEART   ONETOUCH DELICA LANCETS 33G MISC TEST BLOOD SUGAR DAILY   traZODone  (DESYREL ) 100 MG tablet Take 1 tablet (100 mg total) by mouth at bedtime as needed for sleep.   vitamin B-12 (CYANOCOBALAMIN ) 1000 MCG tablet Take 1,000 mcg by mouth daily.   vitamin E 400 UNIT capsule Take 400 Units by mouth 2 (two) times daily.   No facility-administered medications prior to visit.    Allergies  Allergen Reactions   Bupropion     insomnia   Sertraline      Ejaculatory dysfunction   Penicillins Rash    Patient Care Team: Perry Nancyann BRAVO, MD as PCP - General (Family Medicine) [facility], [place]    Objective     Most recent functional status assessment:    11/21/2023    8:52 AM  In your present state of health, do you have any difficulty performing the following activities:  Hearing? 1  Comment Wears hearing aids  Vision? 0  Difficulty concentrating or making decisions? 0  Walking or climbing stairs? 0  Dressing or bathing? 0  Doing errands, shopping? 0   Most recent fall risk assessment:    11/21/2023    8:49 AM  Fall Risk   Falls in the past  year? 0  Number falls in past yr: 0  Injury with Fall? 0  Risk for fall due to : No Fall Risks    Most recent depression screenings:    11/21/2023    8:56 AM 11/21/2023    8:50 AM  PHQ 2/9 Scores  PHQ - 2 Score 0 0  PHQ- 9 Score 1    Most recent cognitive screening:    11/21/2023    8:54 AM  6CIT Screen  What Year? 0 points  What month? 0 points  What time? 0 points  Count back from 20 0 points  Months in reverse 0 points  Repeat phrase 0 points  Total Score 0 points   Most recent Audit-C alcohol use screening    11/21/2023    8:48 AM  Alcohol Use Disorder Test (AUDIT)  1. How often do you have a drink containing alcohol? 0  2. How many drinks containing alcohol do you have on a typical day when you are  drinking? 0  3. How often do you have six or more drinks on one occasion? 0  AUDIT-C Score 0   A score of 3 or more in women, and 4 or more in men indicates increased risk for alcohol abuse, EXCEPT if all of the points are from question 1     Assessment & Plan     Annual wellness visit done today including the all of the following: Reviewed patient's Family Medical History Reviewed and updated list of patient's medical providers Assessment of cognitive impairment was done Assessed patient's functional ability Established a written schedule for health screening services Health Risk Assessent Completed and Reviewed  Exercise Activities and Dietary recommendations  Goals      Patient Stated     None at this time         Immunization History  Administered Date(s) Administered   Fluad Quad(high Dose 65+) 02/05/2019, 01/29/2020, 03/07/2021   Influenza-Unspecified 02/13/2015, 04/01/2016, 03/16/2017, 01/08/2023   Moderna Covid-19 Fall Seasonal Vaccine 12yrs & older 02/26/2022   PFIZER(Purple Top)SARS-COV-2 Vaccination 07/30/2019, 08/20/2019, 05/31/2020   PNEUMOCOCCAL CONJUGATE-20 11/20/2022   Pneumococcal Conjugate-13 05/30/2016   Pneumococcal Polysaccharide-23 05/26/2015   Respiratory Syncytial Virus Vaccine,Recomb Aduvanted(Arexvy) 02/23/2022   Td 06/04/2017   Tdap 01/16/2007, 05/28/2020   Zoster Recombinant(Shingrix ) 08/03/2021, 10/03/2021   Zoster, Live 06/04/2012    Health Maintenance  Topic Date Due   COVID-19 Vaccine (5 - 2024-25 season) 01/14/2023   INFLUENZA VACCINE  12/14/2023   HEMOGLOBIN A1C  05/23/2024   OPHTHALMOLOGY EXAM  11/12/2024   Diabetic kidney evaluation - eGFR measurement  11/20/2024   Diabetic kidney evaluation - Urine ACR  11/20/2024   Medicare Annual Wellness (AWV)  11/20/2024   Colonoscopy  09/29/2026   DTaP/Tdap/Td (4 - Td or Tdap) 05/28/2030   Pneumococcal Vaccine: 50+ Years  Completed   Hepatitis C Screening  Completed   Zoster Vaccines-  Shingrix   Completed   Hepatitis B Vaccines  Aged Out   HPV VACCINES  Aged Out   Meningococcal B Vaccine  Aged Out     Discussed health benefits of physical activity, and encouraged him to engage in regular exercise appropriate for his age and condition.          Nancyann Perry, MD  [facility] [phone] (phone) [phone] (fax)  [facility]

## 2023-11-22 NOTE — Progress Notes (Signed)
 Complete physical exam   Patient: Billy Hardin   DOB: 02-20-1951   73 y.o. Male  MRN: [id number] Visit Date: 11/21/2023  Today's healthcare provider: Nancyann Perry, MD   Chief Complaint  Patient presents with   Annual Exam   Medicare Wellness   Subjective    Discussed the use of AI scribe software for clinical note transcription with the patient, who gave verbal consent to proceed.  History of Present Illness   KHADIM LUNDBERG is a 73 year old male who presents for an annual physical exam.  He has ongoing issues with blood sugar levels, with recent fluctuations from the 130s to the 150s over the past couple of weeks. He is currently taking metformin  750 mg twice a day, which was adjusted from 1000 mg to 500 mg and then to 750 mg. The medication initially seemed effective but has not maintained lower blood sugar levels recently.  He maintains a regular exercise routine, going to the gym three days a week. His regimen includes 30 minutes on the treadmill, 10 minutes on the bicycle, and using three different weight machines, totaling about an hour of exercise per session.  He mentions a left 5th toe injury that occurred six to eight months ago, which has recently started to swell and cause pain. He is unsure if the swelling has been persistent since the injury or if it has worsened recently.  No chest pain, heart flutters, shortness of breath, stomach cramping, or changes in bowel habits.         Past Medical History:  Diagnosis Date   Allergy    Chronic migraine without aura without status migrainosus, not intractable 05/30/2016   Complication of anesthesia    had catheter 4 days unable to void   Deaf    Rt ear   Diabetes mellitus without complication (HCC)    Elevated lipids    Herpes    Insomnia    Seasonal allergies    Sleep apnea    TMJ (dislocation of temporomandibular joint)    Uses hearing aid    Left   Past Surgical History:  Procedure Laterality Date    KIDNEY STONE SURGERY     MIDDLE EAR SURGERY     PENILE PROSTHESIS IMPLANT N/A 11/03/2014   Procedure: PENILE PROTHESIS INFLATABLE;  Surgeon: Ozell JONELLE Burkes, MD;  Location: [facility] ORS;  Service: Urology;  Laterality: N/A;   TONSILLECTOMY AND ADENOIDECTOMY     Social History   Socioeconomic History   Marital status: Married    Spouse name: Not on file   Number of children: Not on file   Years of education: Not on file   Highest education level: 12th grade  Occupational History   Not on file  Tobacco Use   Smoking status: Never   Smokeless tobacco: Never  Vaping Use   Vaping status: Never Used  Substance and Sexual Activity   Alcohol use: Not Currently    Comment: 1-2 drinks total of 2-3x a year   Drug use: No   Sexual activity: Yes    Partners: Female  Other Topics Concern   Not on file  Social History Narrative   Not on file   Social Drivers of Health   Financial Resource Strain: Low Risk  (11/21/2023)   Overall Financial Resource Strain (CARDIA)    Difficulty of Paying Living Expenses: Not hard at all  Food Insecurity: No Food Insecurity (11/21/2023)   Hunger Vital Sign  Worried About Programme researcher, broadcasting/film/video in the Last Year: Never true    Ran Out of Food in the Last Year: Never true  Transportation Needs: No Transportation Needs (11/21/2023)   PRAPARE - Administrator, Civil Service (Medical): No    Lack of Transportation (Non-Medical): No  Physical Activity: Sufficiently Active (07/24/2023)   Exercise Vital Sign    Days of Exercise per Week: 3 days    Minutes of Exercise per Session: 90 min  Stress: No Stress Concern Present (11/21/2023)   [employer] of Occupational Health - Occupational Stress Questionnaire    Feeling of Stress: Not at all  Social Connections: Socially Isolated (11/21/2023)   Social Connection and Isolation Panel    Frequency of Communication with Friends and Family: Once a week    Frequency of Social Gatherings with Friends and Family:  Never    Attends Religious Services: Never    Database administrator or Organizations: No    Attends Banker Meetings: Never    Marital Status: Married  Catering manager Violence: Unknown (11/21/2023)   Humiliation, Afraid, Rape, and Kick questionnaire    Fear of Current or Ex-Partner: No    Emotionally Abused: No    Physically Abused: Not on file    Sexually Abused: No   Family Status  Relation Name Status   Mother Falon Flinchum Deceased at age 22       Cancer   Father Jame Vashon Riordan Alive       cancer   Brother  Alive  No partnership data on file   Family History  Problem Relation Age of Onset   Cancer Mother    Early death Mother    Cancer Father    Diabetes Father    Heart disease Father    CAD Brother    Allergies  Allergen Reactions   Bupropion     insomnia   Sertraline      Ejaculatory dysfunction   Penicillins Rash    Patient Care Team: Gasper Nancyann BRAVO, MD as PCP - General (Family Medicine) [facility] [place], [place]   Medications: Outpatient Medications Prior to Visit  Medication Sig   atorvastatin  (LIPITOR) 20 MG tablet Take 1 tablet by mouth once daily   azelastine  (ASTELIN ) 0.1 % nasal spray Place 1 spray into both nostrils 2 (two) times daily. Use in each nostril as directed   blood glucose meter kit and supplies KIT Dispense based on patient and insurance preference. Check sugar daily   Blood Glucose Monitoring Suppl DEVI 1 each by Does not apply route daily before breakfast. May substitute to any manufacturer covered by patient's insurance.   cholecalciferol (VITAMIN D ) 400 units TABS tablet Take 400 Units by mouth.   CINNAMON PO Take 2 capsules by mouth daily.   fexofenadine  (ALLEGRA ) 180 MG tablet Take 1 tablet (180 mg total) by mouth daily.   Glucose Blood (BLOOD GLUCOSE TEST STRIPS) STRP 1 each by In Vitro route daily before breakfast. May substitute to any manufacturer covered by patient's insurance.   ibuprofen   (ADVIL ) 600 MG tablet Take 1 tablet (600 mg total) by mouth every 6 (six) hours as needed.   Lancet Device MISC 1 each by Does not apply route daily before breakfast. May substitute to any manufacturer covered by patient's insurance.   meloxicam  (MOBIC ) 7.5 MG tablet Take 1-2 tablets (7.5-15 mg total) by mouth daily. For arthritis in hip/knee   metFORMIN  (GLUCOPHAGE -XR) 750 MG  24 hr tablet Take 1 tablet (750 mg total) by mouth 2 (two) times daily with a meal.   metoprolol  succinate (TOPROL -XL) 50 MG 24 hr tablet TAKE 1 TABLET BY MOUTH ONCE DAILY (TAKE  WITH  OR  IMMEDIATELY  FOLLOWING  A  MEAL)   mirtazapine  (REMERON ) 30 MG tablet TAKE 1 TABLET BY MOUTH AT BEDTIME   Omega-3 Fatty Acids (OMEGA 3 500 PO) Take by mouth.   omeprazole  (PRILOSEC) 40 MG capsule TAKE 1 CAPSULE BY MOUTH ONCE DAILY AS NEEDED FOR HEART   ONETOUCH DELICA LANCETS 33G MISC TEST BLOOD SUGAR DAILY   traZODone  (DESYREL ) 100 MG tablet Take 1 tablet (100 mg total) by mouth at bedtime as needed for sleep.   vitamin B-12 (CYANOCOBALAMIN ) 1000 MCG tablet Take 1,000 mcg by mouth daily.   vitamin E 400 UNIT capsule Take 400 Units by mouth 2 (two) times daily.   No facility-administered medications prior to visit.    Review of Systems  Constitutional:  Negative for appetite change, chills and fever.  Respiratory:  Negative for chest tightness, shortness of breath and wheezing.   Cardiovascular:  Negative for chest pain and palpitations.  Gastrointestinal:  Negative for abdominal pain, nausea and vomiting.      Objective    BP 124/69 (BP Location: Left Arm, Patient Position: Sitting, Cuff Size: Normal)   Pulse 65   Resp 16   Ht 6' (1.829 m)   Wt 174 lb 1.6 oz (79 kg)   SpO2 96%   BMI 23.61 kg/m    Physical Exam  General Appearance:    Well developed, well nourished male. Alert, cooperative, in no acute distress, appears stated age  Head:    Normocephalic, without obvious abnormality, atraumatic  Eyes:    PERRL,  conjunctiva/corneas clear, EOM's intact, fundi    benign, both eyes       Ears:    Normal TM's and external ear canals, both ears  Nose:   Nares normal, septum midline, mucosa normal, no drainage   or sinus tenderness  Throat:   Lips, mucosa, and tongue normal; teeth and gums normal  Neck:   Supple, symmetrical, trachea midline, no adenopathy;       thyroid :  No enlargement/tenderness/nodules; no carotid   bruit or JVD  Back:     Symmetric, no curvature, ROM normal, no CVA tenderness  Lungs:     Clear to auscultation bilaterally, respirations unlabored  Chest wall:    No tenderness or deformity  Heart:    Normal heart rate. Normal rhythm. No murmurs, rubs, or gallops.  S1 and S2 normal  Abdomen:     Soft, non-tender, bowel sounds active all four quadrants,    no masses, no organomegaly  Genitalia:    deferred  Rectal:    deferred  Extremities:   All extremities are intact. No cyanosis or edema  Pulses:   2+ and symmetric all extremities  Skin:   Skin color, texture, turgor normal, no rashes or lesions  Lymph nodes:   Cervical, supraclavicular, and axillary nodes normal  Neurologic:   CNII-XII intact. Normal strength, sensation and reflexes      throughout        Assessment & Plan    Routine Health Maintenance and Physical Exam  Exercise Activities and Dietary recommendations  Goals      Patient Stated     None at this time         Immunization History  Administered Date(s) Administered  Fluad Quad(high Dose 65+) 02/05/2019, 01/29/2020, 03/07/2021   Influenza-Unspecified 02/13/2015, 04/01/2016, 03/16/2017, 01/08/2023   Moderna Covid-19 Fall Seasonal Vaccine 38yrs & older 02/26/2022   PFIZER(Purple Top)SARS-COV-2 Vaccination 07/30/2019, 08/20/2019, 05/31/2020   PNEUMOCOCCAL CONJUGATE-20 11/20/2022   Pneumococcal Conjugate-13 05/30/2016   Pneumococcal Polysaccharide-23 05/26/2015   Respiratory Syncytial Virus Vaccine,Recomb Aduvanted(Arexvy) 02/23/2022   Td 06/04/2017    Tdap 01/16/2007, 05/28/2020   Zoster Recombinant(Shingrix ) 08/03/2021, 10/03/2021   Zoster, Live 06/04/2012    Health Maintenance  Topic Date Due   COVID-19 Vaccine (5 - 2024-25 season) 01/14/2023   INFLUENZA VACCINE  12/14/2023   HEMOGLOBIN A1C  05/23/2024   OPHTHALMOLOGY EXAM  11/12/2024   Diabetic kidney evaluation - eGFR measurement  11/20/2024   Diabetic kidney evaluation - Urine ACR  11/20/2024   Medicare Annual Wellness (AWV)  11/20/2024   Colonoscopy  09/29/2026   DTaP/Tdap/Td (4 - Td or Tdap) 05/28/2030   Pneumococcal Vaccine: 50+ Years  Completed   Hepatitis C Screening  Completed   Zoster Vaccines- Shingrix   Completed   Hepatitis B Vaccines  Aged Out   HPV VACCINES  Aged Out   Meningococcal B Vaccine  Aged Out    Discussed health benefits of physical activity, and encouraged him to engage in regular exercise appropriate for his age and condition.   2. Prostate cancer screening  - PSA Total (Reflex To Free)  3. Type 2 diabetes mellitus without complication, without long-term current use of insulin  (HCC) Doing well on current dose of metformin   - Urine Albumin/Creatinine with ratio (send out) [LAB689] - Hemoglobin A1c  4. Hypertension associated with diabetes (HCC) Well controlled.  Continue current medications.    5. Hyperlipidemia associated with type 2 diabetes mellitus (HCC) He is tolerating atorvastatin  well with no adverse effects.   - CBC - Comprehensive metabolic panel with GFR - Lipid panel  6. Other depression Continue current dose of mirtazapine   7. Insomnia, unspecified type Continue current dose of trazodone .   8. Pain of toe of left foot Just a bit swollen today, but not hurting. Has been waxing and waning for several months. Advised he could call for podiatry referral if becomes more painful or if impairs ambulation.        Nancyann Perry, MD  [facility] [phone] (phone) [phone] (fax)  [facility]

## 2023-12-24 ENCOUNTER — Other Ambulatory Visit: Payer: Self-pay | Admitting: Family Medicine

## 2023-12-24 DIAGNOSIS — G4700 Insomnia, unspecified: Secondary | ICD-10-CM

## 2024-02-01 ENCOUNTER — Other Ambulatory Visit: Payer: Self-pay | Admitting: Family Medicine

## 2024-02-01 DIAGNOSIS — R12 Heartburn: Secondary | ICD-10-CM

## 2024-04-14 DIAGNOSIS — M720 Palmar fascial fibromatosis [Dupuytren]: Secondary | ICD-10-CM | POA: Diagnosis not present

## 2024-04-14 DIAGNOSIS — M65311 Trigger thumb, right thumb: Secondary | ICD-10-CM | POA: Diagnosis not present

## 2024-05-11 ENCOUNTER — Other Ambulatory Visit: Payer: Self-pay | Admitting: Family Medicine

## 2024-06-20 ENCOUNTER — Other Ambulatory Visit: Payer: Self-pay | Admitting: Family Medicine

## 2024-06-20 DIAGNOSIS — G43709 Chronic migraine without aura, not intractable, without status migrainosus: Secondary | ICD-10-CM
